# Patient Record
Sex: Female | Born: 1965
Health system: Southern US, Community
[De-identification: ages and names within clinical notes are randomized; demographics above are authoritative.]

## PROBLEM LIST (undated history)

## (undated) ENCOUNTER — Emergency Department (HOSPITAL_COMMUNITY): Discharge status: Against Medical Advice | Payer: BC Managed Care – PPO

## (undated) DIAGNOSIS — R112 Nausea with vomiting, unspecified: Secondary | ICD-10-CM

## (undated) DIAGNOSIS — R7303 Prediabetes: Secondary | ICD-10-CM

## (undated) DIAGNOSIS — M7710 Lateral epicondylitis, unspecified elbow: Secondary | ICD-10-CM

## (undated) DIAGNOSIS — K760 Fatty (change of) liver, not elsewhere classified: Secondary | ICD-10-CM

## (undated) DIAGNOSIS — M549 Dorsalgia, unspecified: Secondary | ICD-10-CM

## (undated) DIAGNOSIS — N816 Rectocele: Secondary | ICD-10-CM

## (undated) DIAGNOSIS — D649 Anemia, unspecified: Secondary | ICD-10-CM

## (undated) DIAGNOSIS — M2550 Pain in unspecified joint: Secondary | ICD-10-CM

## (undated) DIAGNOSIS — E66813 Obesity, class 3: Secondary | ICD-10-CM

## (undated) DIAGNOSIS — Z8639 Personal history of other endocrine, nutritional and metabolic disease: Secondary | ICD-10-CM

## (undated) DIAGNOSIS — Z9989 Dependence on other enabling machines and devices: Principal | ICD-10-CM

## (undated) DIAGNOSIS — R55 Syncope and collapse: Secondary | ICD-10-CM

## (undated) DIAGNOSIS — IMO0002 Reserved for concepts with insufficient information to code with codable children: Secondary | ICD-10-CM

## (undated) DIAGNOSIS — G4733 Obstructive sleep apnea (adult) (pediatric): Secondary | ICD-10-CM

## (undated) DIAGNOSIS — K589 Irritable bowel syndrome without diarrhea: Secondary | ICD-10-CM

## (undated) DIAGNOSIS — I10 Essential (primary) hypertension: Secondary | ICD-10-CM

## (undated) DIAGNOSIS — N3281 Overactive bladder: Secondary | ICD-10-CM

## (undated) DIAGNOSIS — F329 Major depressive disorder, single episode, unspecified: Secondary | ICD-10-CM

## (undated) DIAGNOSIS — M5137 Other intervertebral disc degeneration, lumbosacral region: Secondary | ICD-10-CM

## (undated) DIAGNOSIS — M51379 Other intervertebral disc degeneration, lumbosacral region without mention of lumbar back pain or lower extremity pain: Secondary | ICD-10-CM

## (undated) DIAGNOSIS — R0600 Dyspnea, unspecified: Secondary | ICD-10-CM

## (undated) DIAGNOSIS — F419 Anxiety disorder, unspecified: Secondary | ICD-10-CM

## (undated) DIAGNOSIS — M5412 Radiculopathy, cervical region: Principal | ICD-10-CM

## (undated) DIAGNOSIS — R600 Localized edema: Secondary | ICD-10-CM

## (undated) DIAGNOSIS — E282 Polycystic ovarian syndrome: Secondary | ICD-10-CM

## (undated) DIAGNOSIS — K219 Gastro-esophageal reflux disease without esophagitis: Secondary | ICD-10-CM

## (undated) DIAGNOSIS — K829 Disease of gallbladder, unspecified: Secondary | ICD-10-CM

## (undated) DIAGNOSIS — Z9889 Other specified postprocedural states: Secondary | ICD-10-CM

## (undated) DIAGNOSIS — H9192 Unspecified hearing loss, left ear: Secondary | ICD-10-CM

## (undated) DIAGNOSIS — N938 Other specified abnormal uterine and vaginal bleeding: Secondary | ICD-10-CM

## (undated) DIAGNOSIS — F32A Depression, unspecified: Secondary | ICD-10-CM

## (undated) HISTORY — DX: Dependence on other enabling machines and devices: Z99.89

## (undated) HISTORY — DX: Overactive bladder: N32.81

## (undated) HISTORY — DX: Reserved for concepts with insufficient information to code with codable children: IMO0002

## (undated) HISTORY — DX: Lateral epicondylitis, unspecified elbow: M77.10

## (undated) HISTORY — DX: Gastro-esophageal reflux disease without esophagitis: K21.9

## (undated) HISTORY — DX: Polycystic ovarian syndrome: E28.2

## (undated) HISTORY — DX: Fatty (change of) liver, not elsewhere classified: K76.0

## (undated) HISTORY — DX: Pain in unspecified joint: M25.50

## (undated) HISTORY — DX: Irritable bowel syndrome, unspecified: K58.9

## (undated) HISTORY — PX: ABDOMINAL HYSTERECTOMY: SHX81

## (undated) HISTORY — DX: Other specified abnormal uterine and vaginal bleeding: N93.8

## (undated) HISTORY — DX: Morbid (severe) obesity due to excess calories: E66.01

## (undated) HISTORY — DX: Anemia, unspecified: D64.9

## (undated) HISTORY — DX: Dorsalgia, unspecified: M54.9

## (undated) HISTORY — DX: Prediabetes: R73.03

## (undated) HISTORY — DX: Depression, unspecified: F32.A

## (undated) HISTORY — DX: Dyspnea, unspecified: R06.00

## (undated) HISTORY — PX: COLONOSCOPY: SHX174

## (undated) HISTORY — DX: Essential (primary) hypertension: I10

## (undated) HISTORY — DX: Anxiety disorder, unspecified: F41.9

## (undated) HISTORY — DX: Localized edema: R60.0

## (undated) HISTORY — DX: Major depressive disorder, single episode, unspecified: F32.9

## (undated) HISTORY — DX: Obstructive sleep apnea (adult) (pediatric): G47.33

## (undated) HISTORY — DX: Other intervertebral disc degeneration, lumbosacral region: M51.37

## (undated) HISTORY — DX: Radiculopathy, cervical region: M54.12

## (undated) HISTORY — DX: Rectocele: N81.6

## (undated) HISTORY — DX: Disease of gallbladder, unspecified: K82.9

## (undated) HISTORY — DX: Obesity, class 3: E66.813

## (undated) HISTORY — DX: Other intervertebral disc degeneration, lumbosacral region without mention of lumbar back pain or lower extremity pain: M51.379

---

## 1991-02-14 HISTORY — PX: CHOLECYSTECTOMY: SHX55

## 1999-03-07 ENCOUNTER — Ambulatory Visit (HOSPITAL_COMMUNITY): Admission: RE | Admit: 1999-03-07 | Discharge: 1999-03-07 | Payer: Self-pay | Admitting: *Deleted

## 1999-03-07 ENCOUNTER — Encounter: Payer: Self-pay | Admitting: *Deleted

## 2000-07-05 ENCOUNTER — Other Ambulatory Visit: Admission: RE | Admit: 2000-07-05 | Discharge: 2000-07-05 | Payer: Self-pay | Admitting: Obstetrics and Gynecology

## 2001-05-01 ENCOUNTER — Emergency Department (HOSPITAL_COMMUNITY): Admission: EM | Admit: 2001-05-01 | Discharge: 2001-05-02 | Payer: Self-pay

## 2002-09-08 ENCOUNTER — Encounter: Admission: RE | Admit: 2002-09-08 | Discharge: 2002-12-07 | Payer: Self-pay | Admitting: Endocrinology

## 2008-01-14 ENCOUNTER — Ambulatory Visit (HOSPITAL_COMMUNITY): Admission: RE | Admit: 2008-01-14 | Discharge: 2008-01-14 | Payer: Self-pay | Admitting: Obstetrics and Gynecology

## 2010-02-18 ENCOUNTER — Emergency Department (HOSPITAL_COMMUNITY)
Admission: EM | Admit: 2010-02-18 | Discharge: 2010-02-19 | Payer: Self-pay | Source: Home / Self Care | Admitting: Emergency Medicine

## 2010-02-28 LAB — BASIC METABOLIC PANEL
BUN: 8 mg/dL (ref 6–23)
CO2: 27 mEq/L (ref 19–32)
Calcium: 8.4 mg/dL (ref 8.4–10.5)
Chloride: 105 mEq/L (ref 96–112)
Creatinine, Ser: 0.85 mg/dL (ref 0.4–1.2)
GFR calc Af Amer: >60 mL/min (ref >60)
GFR calc non Af Amer: >60 mL/min (ref >60)
Glucose, Bld: 116 mg/dL — ABNORMAL HIGH (ref 70–99)
Potassium: 3.8 mEq/L (ref 3.5–5.1)
Sodium: 138 mEq/L (ref 135–145)

## 2010-02-28 LAB — CROSSMATCH
ABO/RH(D): A POS
Antibody Screen: NEGATIVE
Unit division: 0
Unit division: 0
Unit division: 0

## 2010-02-28 LAB — CBC
HCT: 20.7 % — ABNORMAL LOW (ref 36.0–46.0)
Hemoglobin: 5.2 g/dL — CL (ref 12.0–15.0)
MCH: 16 pg — ABNORMAL LOW (ref 26.0–34.0)
MCHC: 25.1 g/dL — ABNORMAL LOW (ref 30.0–36.0)
MCV: 63.5 fL — ABNORMAL LOW (ref 78.0–100.0)
Platelets: 435 10*3/uL — ABNORMAL HIGH (ref 150–400)
RBC: 3.26 MIL/uL — ABNORMAL LOW (ref 3.87–5.11)
RDW: 22.1 % — ABNORMAL HIGH (ref 11.5–15.5)
WBC: 8.5 10*3/uL (ref 4.0–10.5)

## 2010-02-28 LAB — ABO/RH: ABO/RH(D): A POS

## 2010-03-10 NOTE — Consult Note (Addendum)
[name], [name] NO.:  [id number]  MEDICAL RECORD NO.:  [id number]          PATIENT TYPE:  EMS  LOCATION:  [facility]                         FACILITY:  [facility]  PHYSICIAN:  Rock Nephew, MD       DATE OF BIRTH:  [birth year]  DATE OF CONSULTATION:  02/18/2010 DATE OF DISCHARGE:                                CONSULTATION   PRIMARY CARE PHYSICIAN:  Dr. Laurann Montana.  PRIMARY GYNECOLOGY DOCTOR:  Dr. Cordelia Pen A. Dickstein.  CHIEF COMPLAINT:  Symptomatic anemia, history of menorrhagia.  HISTORY OF PRESENT ILLNESS:  This 45 year old female comes in with a chief complaint of weakness.  The patient reports that she has been weak for the last month or so.  The patient reports that she has heavy periods.  She has periods that last 7 days.  She has a history of polycystic ovarian syndrome.  She reports on the first day of her period she changes a pad almost every hour.  They are very heavy and last for 7 days.  Her last menstrual period started on January 25, 2010, and ended approximately February 01, 2010.  The patient reports that her periods have been regular for the last four or five months.  The patient also reports that she gets shortness of breath on exertion.  Otherwise she denies any chest pain, any headaches, any abdominal pain, constipation, or diarrhea.  She denies any dark stool or any blood in her stools.  She does report some swelling in her legs.  PAST MEDICAL HISTORY:  History of polycystic ovarian syndrome and history of insulin resistance.  She denies having diabetes.  FAMILY HISTORY:  Father has heart disease.  SURGICAL HISTORY:  She has had a cholecystectomy.  SOCIAL HISTORY:  Nonsmoker, nondrinker.  No drug abuse.  ALLERGIES:  No known drug allergies.  HOME MEDICATIONS:  She takes metformin 500 mg day as needed.  She reports she takes it when she feels woozy.  REVIEW OF SYSTEMS:  Again, no headaches, no blurry vision, and no chest pain.   She has shortness of breath with exertion.  She denies any nausea, vomiting, abdominal pain, and constipation.  No dark stools.  No blood in her stools.  Again, heavy menorrhagia and heavy periods.  She denies any pain in her legs.  She reports she has some swelling in her legs.  PHYSICAL EXAMINATION:  VITAL SIGNS:  Temperature 98.2, blood pressure 141/63, pulse rate 92, respiratory rate 16, 100% saturation on room air. HEAD, EYES, EARS, NOSE, AND THROAT:  Normocephalic, atraumatic.  Pupils equally round and reactive to light. CARDIOVASCULAR:  S1 and S2.  Regular rate and rhythm.  No murmurs.  No rubs. LUNGS:  Clear to auscultation bilaterally.  No wheezes.  No rhonchi. ABDOMEN:  Soft, nontender, nondistended.  Bowel sounds positive.  No guarding or rebound tenderness.  EXTREMITIES:  +1 edema bilaterally. NEUROLOGICAL:  The patient is alert, awake, and oriented x3.  RADIOLOGY:  None.  LABORATORY STUDIES:  WBC count is 8.5, hemoglobin is 5.2, hematocrit 20.7.  Last H and H was 14.8/43.4.  This was in March  2003.  The patient's MCV is 63.5.  Platelets are 435.  Sodium 138, potassium 3.8, chloride 105, bicarbonate 27, BUN 8, creatinine 0.85, glucose 116, calcium is 8.4.  IMPRESSION AND PLAN:  This is a 45 year old female admitted for anemia with mild symptoms.  PROBLEMS: 1. Weakness and iron-deficiency anemia.  Again, the reason for this is     heavy periods and menorrhagia.  The patient should have blood     transfusions of at least 3 units of packed red blood cells.  I     recommended the patient be admitted to [facility].  Further     management of menorrhagia by GYN doctors. 2. Anemia, iron-deficiency anemia.  Again, this is related to     menorrhagia.  The patient should be placed on iron 325 mg p.o.     b.i.d. for at least 30 days and further managed by the patient's     PCP and GYN doctor. 3. History of polycystic ovarian syndrome.  Again, this is being     managed by  GYN. 4. Insulin resistance.  The patient has a history of insulin     resistance.  The patient was advised to have weight loss since the     patient is morbidly obese. 5. DVT prophylaxis.  The patient should receive SCDs for DVT     prophylaxis.  The patient is a full code.     Rock Nephew, MD     NH/MEDQ  D:  02/18/2010  T:  02/18/2010  Job:  [id number]  cc:   Stacie Acres. Cliffton Asters, M.D. Sherry A. Rosalio Macadamia, M.D.  Electronically Signed by Rock Nephew MD on 03/10/2010 10:17:29 AM

## 2010-03-16 ENCOUNTER — Other Ambulatory Visit: Payer: Self-pay | Admitting: Family Medicine

## 2010-03-16 ENCOUNTER — Ambulatory Visit: Admit: 2010-03-16 | Payer: Self-pay | Admitting: Obstetrics & Gynecology

## 2010-03-16 DIAGNOSIS — N938 Other specified abnormal uterine and vaginal bleeding: Secondary | ICD-10-CM

## 2010-03-16 DIAGNOSIS — N949 Unspecified condition associated with female genital organs and menstrual cycle: Secondary | ICD-10-CM

## 2010-03-16 LAB — POCT PREGNANCY, URINE: Preg Test, Ur: NEGATIVE

## 2010-03-21 ENCOUNTER — Ambulatory Visit (HOSPITAL_COMMUNITY)
Admission: RE | Admit: 2010-03-21 | Discharge: 2010-03-21 | Disposition: A | Discharge status: Home / Self Care | Payer: Self-pay | Source: Ambulatory Visit | Attending: Family Medicine | Admitting: Family Medicine

## 2010-03-21 DIAGNOSIS — N852 Hypertrophy of uterus: Secondary | ICD-10-CM | POA: Insufficient documentation

## 2010-03-21 DIAGNOSIS — N949 Unspecified condition associated with female genital organs and menstrual cycle: Secondary | ICD-10-CM | POA: Insufficient documentation

## 2010-03-21 DIAGNOSIS — N938 Other specified abnormal uterine and vaginal bleeding: Secondary | ICD-10-CM

## 2010-03-23 ENCOUNTER — Other Ambulatory Visit: Payer: Self-pay | Admitting: Family Medicine

## 2010-03-23 ENCOUNTER — Encounter: Payer: Self-pay | Admitting: Obstetrics & Gynecology

## 2010-03-23 ENCOUNTER — Ambulatory Visit: Payer: Self-pay | Admitting: Family Medicine

## 2010-03-23 DIAGNOSIS — N858 Other specified noninflammatory disorders of uterus: Secondary | ICD-10-CM

## 2010-03-23 DIAGNOSIS — N938 Other specified abnormal uterine and vaginal bleeding: Secondary | ICD-10-CM

## 2010-03-23 DIAGNOSIS — N949 Unspecified condition associated with female genital organs and menstrual cycle: Secondary | ICD-10-CM

## 2010-03-23 DIAGNOSIS — I10 Essential (primary) hypertension: Secondary | ICD-10-CM

## 2010-03-23 DIAGNOSIS — R1900 Intra-abdominal and pelvic swelling, mass and lump, unspecified site: Secondary | ICD-10-CM

## 2010-03-23 LAB — CONVERTED CEMR LAB
BUN: 11 mg/dL (ref 6–23)
CO2: 25 meq/L (ref 19–32)
Calcium: 8.3 mg/dL — ABNORMAL LOW (ref 8.4–10.5)
Chloride: 107 meq/L (ref 96–112)
Creatinine, Ser: 0.77 mg/dL (ref 0.40–1.20)
Glucose, Bld: 112 mg/dL — ABNORMAL HIGH (ref 70–99)
HCT: 36.5 % (ref 36.0–46.0)
Hemoglobin: 10.3 g/dL — ABNORMAL LOW (ref 12.0–15.0)
MCHC: 28.2 g/dL — ABNORMAL LOW (ref 30.0–36.0)
MCV: 83.1 fL (ref 78.0–100.0)
Platelets: 393 10*3/uL (ref 150–400)
Potassium: 4.5 meq/L (ref 3.5–5.3)
RBC: 4.39 M/uL (ref 3.87–5.11)
RDW: 27.6 % — ABNORMAL HIGH (ref 11.5–15.5)
Sodium: 141 meq/L (ref 135–145)
WBC: 4.8 10*3/uL (ref 4.0–10.5)

## 2010-03-25 ENCOUNTER — Ambulatory Visit (HOSPITAL_COMMUNITY)
Admission: RE | Admit: 2010-03-25 | Discharge: 2010-03-25 | Disposition: A | Discharge status: Home / Self Care | Payer: Self-pay | Source: Ambulatory Visit | Attending: Obstetrics & Gynecology | Admitting: Obstetrics & Gynecology

## 2010-03-25 DIAGNOSIS — M47817 Spondylosis without myelopathy or radiculopathy, lumbosacral region: Secondary | ICD-10-CM | POA: Insufficient documentation

## 2010-03-25 DIAGNOSIS — N9489 Other specified conditions associated with female genital organs and menstrual cycle: Secondary | ICD-10-CM | POA: Insufficient documentation

## 2010-03-25 DIAGNOSIS — N858 Other specified noninflammatory disorders of uterus: Secondary | ICD-10-CM

## 2010-03-25 MED ORDER — IOHEXOL 300 MG/ML  SOLN
100.0000 mL | Freq: Once | INTRAMUSCULAR | Status: AC | PRN
  Administered 2010-03-25: 100 mL via INTRAVENOUS

## 2010-03-30 ENCOUNTER — Ambulatory Visit: Payer: Self-pay | Admitting: Family Medicine

## 2010-03-30 ENCOUNTER — Encounter: Payer: Self-pay | Admitting: Family Medicine

## 2010-03-30 DIAGNOSIS — IMO0002 Reserved for concepts with insufficient information to code with codable children: Secondary | ICD-10-CM | POA: Insufficient documentation

## 2010-03-30 DIAGNOSIS — D649 Anemia, unspecified: Secondary | ICD-10-CM | POA: Insufficient documentation

## 2010-03-30 DIAGNOSIS — I10 Essential (primary) hypertension: Secondary | ICD-10-CM | POA: Insufficient documentation

## 2010-03-30 DIAGNOSIS — N938 Other specified abnormal uterine and vaginal bleeding: Secondary | ICD-10-CM | POA: Insufficient documentation

## 2010-03-30 DIAGNOSIS — N816 Rectocele: Secondary | ICD-10-CM | POA: Insufficient documentation

## 2010-03-30 DIAGNOSIS — R1900 Intra-abdominal and pelvic swelling, mass and lump, unspecified site: Secondary | ICD-10-CM

## 2010-03-31 ENCOUNTER — Ambulatory Visit (INDEPENDENT_AMBULATORY_CARE_PROVIDER_SITE_OTHER): Payer: Self-pay | Admitting: Family Medicine

## 2010-03-31 ENCOUNTER — Encounter: Payer: Self-pay | Admitting: Family Medicine

## 2010-03-31 VITALS — BP 133/82 | HR 77 | Temp 98.8°F | Ht 68.11 in | Wt 355.0 lb

## 2010-03-31 DIAGNOSIS — I10 Essential (primary) hypertension: Secondary | ICD-10-CM

## 2010-03-31 DIAGNOSIS — D649 Anemia, unspecified: Secondary | ICD-10-CM

## 2010-03-31 DIAGNOSIS — E282 Polycystic ovarian syndrome: Secondary | ICD-10-CM | POA: Insufficient documentation

## 2010-03-31 DIAGNOSIS — N938 Other specified abnormal uterine and vaginal bleeding: Secondary | ICD-10-CM

## 2010-03-31 DIAGNOSIS — N949 Unspecified condition associated with female genital organs and menstrual cycle: Secondary | ICD-10-CM

## 2010-03-31 DIAGNOSIS — Z0000 Encounter for general adult medical examination without abnormal findings: Secondary | ICD-10-CM

## 2010-03-31 MED ORDER — HYDROCHLOROTHIAZIDE 25 MG PO TABS: 50.0000 mg | ORAL_TABLET | Freq: Every day | ORAL | Status: DC

## 2010-03-31 NOTE — Progress Notes (Signed)
  Subjective:    Patient ID: [name], female    DOB: February 17, 1965, 45 y.o.   MRN: [id number] SUBJECTIVE:  45 y.o. female for new pt visit.  Pt's only problems are newly diagnosed HTN and morbid obesity.  Pt also has PCOS and dysfunctional uterine bleeding which is being followed by GYN clinic at [facility].    Current Outpatient Prescriptions  Medication Sig Dispense Refill  . hydrochlorothiazide 25 MG tablet Take 2 tablets (50 mg total) by mouth daily.  60 tablet  11   Allergies: Review of patient's allergies indicates no known allergies.  Patient's last menstrual period was 03/21/2010.  ROS:  Feeling well. No dyspnea or chest pain on exertion.  No abdominal pain, change in bowel habits, black or bloody stools.  No urinary tract symptoms. GYN ROS: no breast pain or new or enlarging lumps on self exam, no discharge or pelvic pain. No neurological complaints.     Hypertension This is a new problem. The current episode started 1 to 4 weeks ago. The problem is unchanged. The problem is controlled. Associated symptoms include peripheral edema. Pertinent negatives include no anxiety, blurred vision, chest pain, headaches, malaise/fatigue, palpitations or shortness of breath. There are no associated agents to hypertension. Risk factors for coronary artery disease include family history, obesity and smoking/tobacco exposure. Past treatments include diuretics. The current treatment provides moderate improvement. Compliance problems include medication cost.  There is no history of angina, kidney disease, CAD/MI or CVA.      Review of Systems  Constitutional: Negative for malaise/fatigue.  Eyes: Negative for blurred vision.  Respiratory: Negative for shortness of breath.   Cardiovascular: Negative for chest pain and palpitations.  Neurological: Negative for headaches.       Objective:   Physical Exam    OBJECTIVE:  The patient appears well, alert, oriented x 3, in no distress. BP 133/82   Pulse 77  Temp(Src) 98.8 F (37.1 C) (Oral)  Ht 5' 8.11" (1.73 m)  Wt 355 lb (161.027 kg)  BMI 53.80 kg/m2  LMP 03/21/2010 ENT normal.  Neck supple. No adenopathy or thyromegaly. PERLA. Lungs are clear, good air entry, no wheezes, rhonchi or rales. S1 and S2 normal, no murmurs, regular rate and rhythm. Abdomen soft without tenderness, guarding, mass or organomegaly. Extremities show no edema, normal peripheral pulses. Neurological is normal, no focal findings.  BREAST EXAM: breasts appear normal, no suspicious masses, no skin or nipple changes or axillary nodes    Assessment & Plan:  ASSESSMENT:  well woman  PLAN:  mammogram additional lab tests per orders return annually or prn

## 2010-03-31 NOTE — Patient Instructions (Signed)
It was so nice to meet you! Please get the number for [name] at the front so that you can see if you qualify for an orange card. Once you have talked with [name], please come back to see me so that we can check your cholesterol and see how your blood pressure is doing. Let me know when the GYN clinic schedules you for surgery! Feel free to come back any time you need something!

## 2010-03-31 NOTE — Assessment & Plan Note (Signed)
New pt today, being referred by [facility] GYN clinic for HTN. Overall, no serious health problems outside of obesity and dysfunctional uterine bleeding. Currently uninsured, will d/w [facility].  Needs baseline FLP, BMET, CBC (esp with h/o anemia 2/2 uterine bleeding)

## 2010-03-31 NOTE — Assessment & Plan Note (Signed)
BP improved on recheck. Had been started on HCTZ 25mg  po daily 2 weeks ago at [facility].  Has been taking ~1 week.  No side effects or hypotensive symptoms. Tolerating well.  Increase HCTZ to 50mg  qd. Told pt to decrease back to 25mg  if experiencing any s/s of hypotension, including orthostatic hypotension.  Will check BMET once pt has met w/ [name].

## 2010-03-31 NOTE — Assessment & Plan Note (Signed)
According to pt, GYN would like to start her on lupron and then have her see GYN surgery for large fibroids. Has regular periods that last 6 days, but 2nd day is very heavy bleeding (passes clots, needs to change pad every 15-30 min), on other days changes pad every 2-3 hours)

## 2010-03-31 NOTE — Assessment & Plan Note (Signed)
Pt aware she needs to lose weight.  We discussed setting small, reasonable goals.  She is in agreement but not ready to make any set plans at this time.  Also made her aware that we have a wonderful nutritionist if she feels like that would help her with diet.

## 2010-04-06 ENCOUNTER — Ambulatory Visit: Payer: Self-pay

## 2010-04-06 DIAGNOSIS — N938 Other specified abnormal uterine and vaginal bleeding: Secondary | ICD-10-CM

## 2010-04-06 DIAGNOSIS — N949 Unspecified condition associated with female genital organs and menstrual cycle: Secondary | ICD-10-CM

## 2010-05-06 NOTE — Progress Notes (Signed)
[name], [name]                  ACCOUNT NO.:  [id number]  MEDICAL RECORD NO.:  [id number]           PATIENT TYPE:  A  LOCATION:  [facility]                   FACILITY:  [facility]  PHYSICIAN:  Lucina Mellow, DO   DATE OF BIRTH:  04-10-1965  DATE OF SERVICE:  03/30/2010                                 CLINIC NOTE  The patient presents for a result of her CT scan and blood work and states that she has an appointment with [facility] tomorrow.  HISTORY OF PRESENT ILLNESS:  The patient is a 45 year old Caucasian female who has been seen in the clinic for concern of dysfunctional uterine bleeding.  She was noted to have a pelvic mass on ultrasound at her last visit and was __________ away and got a CT scan to further evaluate this.  She is here today for that result.  She also had some blood work done last week and she is here for those results to determine how anemic she is.  She has a history of having had 3 units of blood transfused on February 18, 2010.  She states that she feels fine.  Last week, we also started her on hydrochlorothiazide 25 mg daily in an attempt to control her high blood pressure.  The blood pressure has not changed, but the patient states that she feels better and has actually lost 9 pounds since last week.  As earlier stated she has an appointment tomorrow with family practice to further have her blood pressure evaluated.  Her results were shared with her and are as follows, the CT scan shows that the uterus is prominent size measuring 15 cm x 9.8 cm x 10.4 cm.  The ovaries are within normal limits by CT scan.  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  It also states that the possibility of a rare lipoleiomyosarcoma is difficult to completely exclude.  Her blood work showed that her hemoglobin is 10.3, hematocrit is 36.5, MCH is 23.5, MCHC is 28.2, and RDW is 27.6.  Her glucose was elevated at 112.  Her basic metabolic panel was otherwise  normal with normal renal function.  PHYSICAL EXAMINATION:  VITAL SIGNS:  Today show a blood pressure 154/100, weight of 353.0 pounds, height is 59 inches, pulse of 96, temperature of 98.8. GENERAL:  The patient is a pleasant-looking female who looks her stated age of 20.  ASSESSMENT: 1. Uterine mass noted to be a leiomyoma versus a lipoleiomyoma.  She     is made aware of the CT scan results and is told that she would     benefit from a Depo-Lupron shot.  Her paperwork was filled out last     week, but whenever we called to find out if they had received the     request, they said they had not received the fax with her income     information, so all of the information, the request and income     information was faxed again today with hopes that we will get a     response back within a week.  The patient is  informed that she will     receive a letter at approximately the same time the clinic received     the letter of her approval or disapproval.  When she receives the     letter hopefully stating that she is approved, she is to call the     clinic and setup when she is to come and get her shot of Depo-     Lupron.  After her shot of Depo-Lupron within the next 3 months,     she is to follow up to see one of our GYN surgeons to discuss     surgical options at that time. 2. High blood pressure.  She will see family practice tomorrow.  She     will continue the 25 mg of hydrochlorothiazide and family medicine     can decide to change that or add to it. 3. Dysfunctional uterine bleeding.  If she has a period in the     meantime that is heavier than usual, she is to contact the clinic     and be assessed.  She can also take her Lysteda if necessary to     help control the bleeding and the     Lupron again is suggested as a way to help control the bleeding.     The patient voices understanding and agrees with these plans.          ______________________________ Lucina Mellow,  DO    SH/MEDQ  D:  03/30/2010  T:  03/31/2010  Job:  [id number]

## 2010-05-06 NOTE — Progress Notes (Signed)
[name], [name] NO.:  [id number]  MEDICAL RECORD NO.:  [id number]           PATIENT TYPE:  A  LOCATION:  [facility]                   FACILITY:  [facility]  PHYSICIAN:  Lucina Mellow, DO   DATE OF BIRTH:  15-Jul-1965  DATE OF SERVICE:  03/23/2010                                 CLINIC NOTE  The patient was seen in the [facility] on the afternoon March 23, 2010.  The patient presents for followup on her ultrasound results that she had last week.  HISTORY OF PRESENT ILLNESS:  The patient is a 45 year old, gravida 1, para 1 who was sent to [facility] after being seen in the emergency room on February 18, 2010, for weakness and fatigue.  At which time, she was noted to be quite anemic and was transferred 3 units of blood.  She states that previously when she was seen at [facility], they gave her a prescription for a medication called Lysteda 650 mg. She states that she was instructed to take that if the bleeding became heavy.  This morning, she states that the bleeding became heavy and she may she had quite a bit of running around to do including admitting this appointment today that she took one this morning and one earlier in the afternoon.  She states that has the system with some of the bleeding it made her little bit less.  She states that she has some mild discomfort on her left lower quadrant that has been there for quite some time and has not changed over the course of the last 2 weeks.  She states that she has also not been seen yet by [facility].  She has an appointment on March 31, 2010, for evaluation of her blood pressure. She has no new concerns at this time.  To review, her past surgical history includes a gallbladder removal.  Gynecologic history includes no history of abnormal Pap test or sexually transmitted diseases.  Obstetrically, she has a history of one pregnancy, one vaginal delivery at term 18  years ago.  On physical exam; the patient is a pleasant female looking her stated age of 45 years old.  Vital signs include temp of 99.3, pulse of 77, blood pressure 152/87, on repeat was 140/90, weight of 362.5 pounds, height of 69 inches.  The patient's laboratory values were shared with her today.  The Pap test was negative and satisfactory for evaluation with an endocervical transformation zone present and it was negative for lesions or malignancy.  Her ultrasound showed a uterus measuring 17 x 7 x 18.5 x 10.9 cm.  A large hyperechoic mass is seen centrally within the uterine body and fundus which measures approximately 10 cm.  It is uncertain whether this arises from the myometrium or endometrium, posterior acoustic artifact limits visualization in the posterior portion of this mass and posterior known Macaulay.  No normal-appearing endometrial stripe is visualized by transabdominal and transvaginal ultrasound.  It is recommended by the ultrasound report for the patient to receive a pelvic MRI to further visualize this mass.  There is also nonvisualization of the  ovaries, but there are no adnexal masses or free fluid identified.  ASSESSMENT: 1. Uterine masses versus large leiomyoma.  The patient is shared these     results including the Pap test results.  We discussed her options     at this point and the need to get an MRI.  However, whenever     attempt was made to schedule this MRI, it was found that the     patient is too large for the MRI machine at [facility] and would     have to re-refer to an outside facility.  However, that costs quite     a bit of money and the patient is unable to pay that at this time.     The decision was made for her to go forth with getting a CT scan of     her abdomen and see what we could see that way.  A CBC and a basic     metabolic panel were drawn in the clinic today, so that we can     assess her kidney function and order for the contrast that  she will     need for her MRI/CT evaluation as well as to evaluate were as her     hemoglobin at this time more than 1 month after her 3 units of     transfusion during in the emergency room.  The patient will return     to the clinic after her CT scan imaging at which time, we will     discuss those results and further plans at that time. 2. Hypertension.  The patient has an appointment at 1-week to be seen     by [facility] if her blood pressure remains to be elevated.  I     started her on 25 mg of hydrochlorothiazide a day knowing that this     is a very inexpensive medication and she should be able to easily     afford it over-the-counter.  In addition, she should follow up in a     week with her doctor at [facility] to see if this is an     adequate dose of medication.  It is discussed with the patient that     it would be difficult to feel safe taking her to the operating room     and control blood pressure and is in paramount importance for [place] to     control that because ultimately she will likely have to have a     hysterectomy to have this mass removed. 3. Because of the bleeding and because of the size of the mass, she     will likely need to have Lupron injections to help shrink the mass     and control for the bleeding.  However, again this is quite costly     and upon discharge from my clinic today, the patient also completed     a paper work to receive financial assistance from the Lupron.     Hopefully, we will know the results of this request when she     returns in about a week after her CT scan and we can discuss     getting her, her Lupron shot at that time.  All the patient's     questions were answered and she agrees with plan.          ______________________________ Lucina Mellow, DO    SH/MEDQ  D:  03/23/2010  T:  03/24/2010  Job:  [id number]

## 2010-05-06 NOTE — Progress Notes (Signed)
[name], [name]                  ACCOUNT NO.:  [id number]  MEDICAL RECORD NO.:  [id number]           PATIENT TYPE:  A  LOCATION:  [facility]                   FACILITY:  [facility]  PHYSICIAN:  Lucina Mellow, DO   DATE OF BIRTH:  15-May-1965  DATE OF SERVICE:  03/16/2010                                 CLINIC NOTE  PRESENTING COMPLAINT:  Heavy menstrual bleeding.  She was referred to [place] from [facility].  The patient was seen in the emergency room at [facility] on February 18, 2010, who presented with a chief complaint of weakness.  The patient was seen and evaluated and was noted to have a hemoglobin of 5.  Her weakness started whenever she started having heavy period that day.  The patient gives a history that she feels like the hemoglobin had been slowly dropping over the course of the last couple of months if not last 2 years, because she admits to having symptoms of __________ she has been crunching on ice constantly. She states that her periods have been irregular and heavy for quite some time and even admits to having an endometrial biopsy in 2006 at [facility] where it was diagnosed as normal and no treatment plan was ensued.  The patient states that since she was seen at that time in 2006 at [facility], she lost her medical insurance which is why she was referred to our clinic for treatment and care.  She is in the process of applying for financial assistance through the [facility].  She states that her heaviest is about the second day of her period and her last menstrual period started January 24, 2010, where she had quite a bit of clotting which was worse than previous periods she states.  She has had some sort of spotting off and on ever since the bleeding stopped in mid January 2012 but no bleeding has started.  When she was seen on February 18, 2010, she was evaluated and managed in the ER she states because of her lack of insurance they gave her 3 units transfusion  there in the emergency room and discharged her home.  She has no pain but does admit to some mild cramping off and on, and she also states that she has some stress and urge incontinence that she has had for quite some time number of years and will lose control of her bladder when she coughs, sneezes, or laughs.  She denies ever having an ultrasound since the onset of the current symptoms.  PAST SURGICAL HISTORY:  Gallbladder removal.  GYNECOLOGIC HISTORY:  History includes no history of abnormal Pap test or sexually transmitted diseases.  OBSTETRICAL HISTORY:  One pregnancy, one vaginal delivery at term.  Her baby is now 7 years old.  FAMILY HISTORY:  Significant for colon cancer in uncle but otherwise no other history of endometrial, ovarian, uterine, or breast cancers.  MEDICATIONS:  Currently include iron and Motrin.  PHYSICAL EXAMINATION:  VITAL SIGNS:  Temperature is 100.2, her pulse is 68, blood pressure is 150/93, weight is 359 pounds, and 69 inches high. GENERAL:  She is a pleasant Caucasian  female. HEART:  Regular rate and rhythm with no audible murmurs. LUNGS:  Clear to auscultation bilaterally. NECK:  Thyroid is nonpalpable. PELVIC:  Externally, her female genitalia are normal in appearance. Internal vaginal exam with speculum is somewhat difficult secondary to the cystocele and rectocele noted.  It is difficult to isolate the cervix to get a Pap test.  However it is isolated and the Pap test is obtained.  The pressure against the cervix for the Pap test pushes the cervix back out of view in such a way that I am unsure if I got in the cervical cells with a Pap testing.  A Pap speculum exam shows that she has probably a grade 3 to 4 cystocele and rectocele are quite significant as well and was tender to touch.  Bimanual exam was somewhat difficult due to body habitus, however, it did feel that her uterus was dropped and firm in the anterior vaginal vault, was not  tender to touch at that time.  Her vaginal vault was tender posteriorly where the rectocele was seen.  ASSESSMENT: 1. Dysfunctional uterine bleeding.  Secondary to the difficulty of     obtaining just the Pap smear probably it would have been difficult     to obtain an endometrial biopsy in the clinic today at this time.     However, it was suggested by attending doctor, Dr. Okey Dupre, that the     patient have an ultrasound first to determine what is the     endometrial stripe at this time which would help and indicate     whether or not endometrial biopsy would give [place] a good yield.  So     the patient was scheduled for an ultrasound after she was seen by     the provider in clinic today which was scheduled for March 21, 2010, at 12:30.  She will then follow up in the GYN Clinic again     for this results and treatment plans at that time. 2. Cystocele and rectocele.  This likely should be managed with     surgery, weight loss, but at this time, we will manage     symptomatically. 3. The patient was also noted to have high blood pressure today in     clinic, and she will be referred to [facility].     She will be assessed for that to determine if she needs to be on     medication for her blood pressure.  The patient's questions were     answered, and she voiced understanding and agreed with plans.          ______________________________ Lucina Mellow, DO    SH/MEDQ  D:  03/16/2010  T:  03/17/2010  Job:  [id number]

## 2010-05-23 ENCOUNTER — Ambulatory Visit: Payer: Self-pay | Admitting: Obstetrics & Gynecology

## 2010-05-23 ENCOUNTER — Other Ambulatory Visit: Payer: Self-pay | Admitting: Obstetrics & Gynecology

## 2010-05-23 ENCOUNTER — Other Ambulatory Visit (HOSPITAL_COMMUNITY)
Admission: RE | Admit: 2010-05-23 | Discharge: 2010-05-23 | Disposition: A | Discharge status: Home / Self Care | Payer: Self-pay | Source: Ambulatory Visit | Attending: Obstetrics & Gynecology | Admitting: Obstetrics & Gynecology

## 2010-05-23 DIAGNOSIS — N938 Other specified abnormal uterine and vaginal bleeding: Secondary | ICD-10-CM | POA: Insufficient documentation

## 2010-05-23 DIAGNOSIS — N949 Unspecified condition associated with female genital organs and menstrual cycle: Secondary | ICD-10-CM | POA: Insufficient documentation

## 2010-05-23 DIAGNOSIS — D259 Leiomyoma of uterus, unspecified: Secondary | ICD-10-CM

## 2010-05-23 LAB — POCT PREGNANCY, URINE: Preg Test, Ur: NEGATIVE

## 2010-05-24 NOTE — Progress Notes (Signed)
[name], HACK NO.:  [id number]  MEDICAL RECORD NO.:  [id number]           PATIENT TYPE:  [facility]ATION:  [facility]                     FACILITY:  PHYSICIAN:  Scheryl Darter, MD       DATE OF BIRTH:  1965-04-14  DATE OF SERVICE:                                 CLINIC NOTE  The patient comes today in followup for surgical consult.  She is a 45- year-old white female, gravida 1, para 1, last menstrual period April 22, 2010, which is very heavy.  She had been hospitalized in January with heavy bleeding, anemia, and she received 3 units of packed red blood cells.  The patient was found to have a uterine mass that was fat containing, measuring 8.3 x 8.1 x 6.4 cm.  The endometrium was not well delineated or visualized.  Possibility was that this was a lipoleiomyoma with possibility of rare lipoleiomyosarcoma, which could not be completely excluded.  The patient does have some bleeding that lasted since the date of her last period until recently.  No complaints of pain.  PHYSICAL EXAMINATION:  VITAL SIGNS:  Weight is 360 pounds, height 69 inches, blood pressure is 137/96, pulse 78, temperature 98.5. GENERAL:  The patient has normal affect.  No acute distress. ABDOMEN:  Obese and nontender. PELVIC:  External genitalia, vagina, and cervix appeared normal.  Cervix was high and posterior and small.  There was scant amount of blood. Uterus is about 12-14 weeks size and nontender.  Discussed having an endometrial biopsy prior to making any decisions about surgery.  Explained the procedure and the risk of pain, bleeding, infection, or uterine damage.  She signed consent for this.  She had an endometrial biopsy few years at [facility].  Cervix was prepped with Betadine and Milex.  Sampler was passed 10 cm and some bloody tissue was obtained.  She tolerated this well.  All instruments were removed.  She will be notified of the result when she returns next week on April  18.     Scheryl Darter, MD    JA/MEDQ  D:  05/23/2010  T:  05/24/2010  Job:  [id number]

## 2010-06-01 ENCOUNTER — Ambulatory Visit: Payer: Self-pay | Admitting: Obstetrics & Gynecology

## 2010-06-01 DIAGNOSIS — D259 Leiomyoma of uterus, unspecified: Secondary | ICD-10-CM

## 2010-06-01 NOTE — H&P (Signed)
NAMESARAI, [name] NO.:  [id number]  MEDICAL RECORD NO.:  [id number]           PATIENT TYPE:  A  [facility]ATION[facility]                   [facility]  [facility]  PHYSICIAN:  Scheryl Darter, MD       DATE OF BIRTH:  1965/09/03  DATE OF SERVICE:                          PRE-OP HISTORY & PHYSICAL  CHIEF COMPLAINT:  Heavy bleeding, anemia, and uterine mass.  The patient is a 45 year old white female gravida 1, para 1, last menstrual period April 22, 2010 who has a history of heavy menstrual periods, anemia, and transfusion.  In [name] she was admitted with anemia.  She received 3 units of packed red blood cells on her admission.  She is to be followed by [facility] for her blood pressure.  She has large fibroid uterus, possible lipoleiomyoma with a rare lipo leiomyosarcoma not completely excluded.  Endometrial biopsy was done recently.  She does have some sense of a pelvic mass and some urinary urgency.  PAST MEDICAL HISTORY: 1. Morbid obesity. 2. Insulin resistance. 3. Hypertension. 4. Polycystic ovary syndrome. 5. Gallbladder disease.  PAST SURGICAL HISTORY:  Cholecystectomy.  PAST OB HISTORY:  Term vaginal delivery 18 years ago.  FAMILY HISTORY:  Colon cancer in uncle.  No history of ovarian, endometrial, uterine, or breast cancer.  MEDICATIONS: 1. Iron supplement 325 mg p.o. b.i.d. 2. Hydrochlorothiazide 25 mg p.o. daily. 3. Ibuprofen p.o. p.r.n. pain.  SOCIAL HISTORY:  The patient is married.  She is a former smoker.  She denies alcohol or drug use.  Her husband has had a vasectomy.  ALLERGIES:  No known drug allergies.  No latex allergies.  REVIEW OF SYSTEMS:  No bleeding currently except for very slight amount which occurred a few days ago.  No abnormal discharge.  Urinary frequency and urgency as noted.  No shortness of breath, chest pain.  PHYSICAL EXAMINATION:  GENERAL:  The patient in no acute distress.  She has pleasant affect. VITAL  SIGNS:  Weight is 359.6 pounds, height 69 inches, blood pressure 150/94, pulse 66, temperature 98.5. CHEST:  Clear. HEART:  Regular rhythm.  No thyromegaly or lymphadenopathy. ABDOMEN:  Obese, soft, nontender with firm mass below the umbilicus consistent with her fibroid uterus. PELVIC:  Performed on April 9 showed normal external genitalia, vagina, and cervix.  Cervix was high and posterior.  Uterus is 12-14-week size and nontender.  Endometrium sounded to 10 cm.  LABORATORY DATA:  Endometrial biopsy on April 9 showed proliferative type endometrium.  Pap test on March 16, 2010 was negative.  IMPRESSION: 1. Large fibroid uterus with possible lipoleiomyoma.  Leiomyosarcoma     is not excluded but doubt based on her other clinical findings and     result of her endometrial biopsy. 2. Hypertension. 3. Obesity. 4. History of insulin resistance. 5. history of anemia.  PLAN:  The patient will be scheduled for total abdominal hysterectomy, bilateral salpingo-oophorectomy.  We discussed possibility of preservation of her ovaries and she was given this option but after answering her questions, she said she would feel more comfortable with removal of her ovaries.  She understands she might be on estrogen  therapy after that.  The risks of anesthesia, bleeding, infection, bowel or urinary tract damage, and pain were discussed.  I also mentioned the risks of there was a cancer of the uterus but that has not so far as does not point to that being the case.  We will discuss the results of pathology after the procedure.  Questions were answered.  She is scheduled for another Lupron Depo on May 10 and if surgery has not been performed by then, I would suggest that she receive the injection.     Scheryl Darter, MD    JA/MEDQ  D:  06/01/2010  T:  06/01/2010  Job:  [id number]

## 2010-06-23 ENCOUNTER — Ambulatory Visit (INDEPENDENT_AMBULATORY_CARE_PROVIDER_SITE_OTHER): Payer: Self-pay | Admitting: Family Medicine

## 2010-06-23 ENCOUNTER — Encounter: Payer: Self-pay | Admitting: Family Medicine

## 2010-06-23 ENCOUNTER — Ambulatory Visit: Payer: Self-pay

## 2010-06-23 VITALS — BP 132/82 | HR 81 | Temp 98.3°F | Wt 365.3 lb

## 2010-06-23 DIAGNOSIS — L989 Disorder of the skin and subcutaneous tissue, unspecified: Secondary | ICD-10-CM

## 2010-06-23 DIAGNOSIS — N949 Unspecified condition associated with female genital organs and menstrual cycle: Secondary | ICD-10-CM

## 2010-06-23 DIAGNOSIS — I10 Essential (primary) hypertension: Secondary | ICD-10-CM

## 2010-06-23 DIAGNOSIS — D259 Leiomyoma of uterus, unspecified: Secondary | ICD-10-CM

## 2010-06-23 DIAGNOSIS — N938 Other specified abnormal uterine and vaginal bleeding: Secondary | ICD-10-CM

## 2010-06-23 NOTE — Progress Notes (Signed)
Pt is here today for follow up.  She was to get orange card after last office visit, but received a letter in the mail saying that she had 100% coverage and got confused (this was saying she would have that if she applied), so she is going next week to get this.  Pt has surgery by GYN on 07/08/10 (likely total hysterectomy +/- ovaries) for her PCOS and dysfunctional uterine bleeding.  Pt states that the bleeding has improved some what. She is debating on whether or not to have her ovaries removed also-- she is leaning toward just getting everything removed and is interested in discussing hormone replacement options if this is what she has done.  Is also wondering if she should start taking metformin again (she was taking this before for PCOS, no diagnosis of DM); we discussed that depending on what procedure she ends up having done will tailor the answers to these questions.  Encouraged her to ask her GYN for input also.  As for pt's HTN, is tolerating increased dose of HCTZ (25 --> 50mg ). No orthostatic symptoms, no CP, lower ext edema has greatly improved, no HA's or visual changes. No SOB. Will need BMET checked to f/u K and Cr, but has pre-op appt 5/15, so will hold on labs until 1. Pt has orange card and 2. To see which labs are done next week so that they are not duplicated unnecessarily.   Pt's only other concern is a "spot" on her shoulder which her husband noticed.  It has been there for approximately a year; no pain or swelling, has not changed. No h/o skin cancer or prolonged sun exposure or bad sunburns. No spots anywhere else on body that she or her husband have noticed.   PE: afebrile, VSS GEN: NAD, in good spirits, accompanied by daughter CV: RRR, no murmur, 2+ pedal pulses Pulm: CTAB, no wheezes or crackles Ext: WWP, no edema Skin: small, non-raised, erythematous, blanching 1x1cm area, appears to be c/w scar tissue, well healed, no drainage or fluctuant areas  A/P: 45 yo here for f/u with  surgery scheduled for 07/08/10. Pt's BP is improved and from a CV standpoint pt should be cleared for surgery. Pre-op is 5/15; told pt to have them call me with any questions or concerns. -Is getting orange card next week before surgery.  Will need BMET, FLP, and CBC (check hgb b/c of h/o dysfunctional uterine bleeding) at some point; will f/u what is done as pre-op w/u. -Will follow area on shoulder-- most c/w healing scar, does not appear to be concerning for malignancy; is not a raised mole, no dark areas, consistent color, unchanged x59yr.  -BP acceptable at 132/82 on HCTZ 50 qd; will hold on adjusting or adding meds as this time as BPs are normal and I have not been able to check renal function.

## 2010-06-23 NOTE — Assessment & Plan Note (Signed)
Stable, small 1x1 area on right shoulder; pet pt has been there and unchanged x1 year. No pain, no drainage, flat, no hyperpigmentation.  Appears to be benign healing scar.  Will continue to follow/monitor for any changes that would make it concerning for a skin cancer.

## 2010-06-23 NOTE — Assessment & Plan Note (Signed)
BP normotensive. Continue HCTZ 50mg . No s/s of hypotensio, LEE improved. Will need to check BMET (either f/u pre-op or order post-op)

## 2010-06-23 NOTE — Assessment & Plan Note (Signed)
Scheduled for total hysterectomy +/- ovaries on 07/08/10.  Issues that may need to be discussed s/p surgery depending on if pt decides to have ovaries removed, inc hormone replacement.  Will also need CBC checked to f/u hgb after bleeding has been resolved.

## 2010-06-27 ENCOUNTER — Ambulatory Visit: Payer: Self-pay

## 2010-06-27 DIAGNOSIS — N938 Other specified abnormal uterine and vaginal bleeding: Secondary | ICD-10-CM

## 2010-06-27 DIAGNOSIS — N949 Unspecified condition associated with female genital organs and menstrual cycle: Secondary | ICD-10-CM

## 2010-06-28 ENCOUNTER — Other Ambulatory Visit: Payer: Self-pay | Admitting: Obstetrics & Gynecology

## 2010-06-28 ENCOUNTER — Encounter (HOSPITAL_COMMUNITY): Payer: Self-pay | Attending: Obstetrics & Gynecology

## 2010-06-28 DIAGNOSIS — Z01818 Encounter for other preprocedural examination: Secondary | ICD-10-CM | POA: Insufficient documentation

## 2010-06-28 LAB — BASIC METABOLIC PANEL
BUN: 15 mg/dL (ref 6–23)
CO2: 29 mEq/L (ref 19–32)
Calcium: 9 mg/dL (ref 8.4–10.5)
Chloride: 99 mEq/L (ref 96–112)
Creatinine, Ser: 0.73 mg/dL (ref 0.4–1.2)
GFR calc Af Amer: >60 mL/min (ref >60)
GFR calc non Af Amer: >60 mL/min (ref >60)
Glucose, Bld: 106 mg/dL — ABNORMAL HIGH (ref 70–99)
Potassium: 3.3 mEq/L — ABNORMAL LOW (ref 3.5–5.1)
Sodium: 140 mEq/L (ref 135–145)

## 2010-06-28 LAB — CBC
HCT: 45.9 % (ref 36.0–46.0)
Hemoglobin: 14.6 g/dL (ref 12.0–15.0)
MCH: 28.9 pg (ref 26.0–34.0)
MCHC: 31.8 g/dL (ref 30.0–36.0)
MCV: 90.9 fL (ref 78.0–100.0)
Platelets: 234 10*3/uL (ref 150–400)
RBC: 5.05 MIL/uL (ref 3.87–5.11)
RDW: 13.5 % (ref 11.5–15.5)
WBC: 7.7 10*3/uL (ref 4.0–10.5)

## 2010-06-28 LAB — SURGICAL PCR SCREEN
MRSA, PCR: NEGATIVE
Staphylococcus aureus: NEGATIVE

## 2010-07-06 ENCOUNTER — Ambulatory Visit (INDEPENDENT_AMBULATORY_CARE_PROVIDER_SITE_OTHER): Payer: Self-pay

## 2010-07-06 DIAGNOSIS — D259 Leiomyoma of uterus, unspecified: Secondary | ICD-10-CM

## 2010-07-07 NOTE — Assessment & Plan Note (Signed)
NAMEKATRINNA, TRAVIESO NO.:  [id number]  MEDICAL RECORD NO.:  [id number]           PATIENT TYPE:  LOCATION:  [facility]           FACILITY:  PHYSICIAN:  Tinnie Gens, MD        DATE OF BIRTH:  [birth year]  DATE OF SERVICE:  07/06/2010                                 CLINIC NOTE  CHIEF COMPLAINT:  Surgery consult.  HISTORY OF PRESENT ILLNESS:  The patient is a 45 year old gravida 1, para 1 who is scheduled for abdominal hysterectomy for a large fibroid uterus including a possible lipoleiomyoma with rare lipoleiomyosarcoma not being excluded.  She has undergone endometrial sampling in the past, was scheduled Dr. Scheryl Darter.  Because of family emergency, this is being rescheduled leading with her today just to discussed her surgery. I have reviewed her past medical history, surgical history, OB, family history, medications.  We did discuss the surgery at length including complication risk of bleeding, infection, injury to surrounding structures.  We need to fix this if they are found at the time of surgery or delayed presentation of these things.  DVT risk and death with the anesthesia.  The patient understands all this.  On exam today, she does have a large mass that fills the lower abdomen, feels like there is fibroids up to just below the umbilicus.  We did not complete a pelvic today, she has had this done previously.  She did get two shots of Depo-Lupron, which she continues to have spotting all the time.  She desires abdominal hysterectomy with bilateral salpingo-oophorectomy, which we will do.  Additionally, we discussed risk of cancer and need for further surgery if we find that.  The patient understands all of this and will follow up with [place] as needed.          ______________________________ Tinnie Gens, MD    TP/MEDQ  D:  07/06/2010  T:  07/06/2010  Job:  [id number]

## 2010-07-08 ENCOUNTER — Inpatient Hospital Stay (HOSPITAL_COMMUNITY)
Admission: RE | Admit: 2010-07-08 | Discharge: 2010-07-10 | DRG: 743 | Disposition: A | Discharge status: Home / Self Care | Payer: Self-pay | Source: Ambulatory Visit | Attending: Obstetrics & Gynecology | Admitting: Obstetrics & Gynecology

## 2010-07-08 ENCOUNTER — Other Ambulatory Visit: Payer: Self-pay | Admitting: Family Medicine

## 2010-07-08 DIAGNOSIS — D259 Leiomyoma of uterus, unspecified: Secondary | ICD-10-CM

## 2010-07-08 DIAGNOSIS — N920 Excessive and frequent menstruation with regular cycle: Principal | ICD-10-CM | POA: Diagnosis present

## 2010-07-08 DIAGNOSIS — E669 Obesity, unspecified: Secondary | ICD-10-CM | POA: Diagnosis present

## 2010-07-08 DIAGNOSIS — Z01818 Encounter for other preprocedural examination: Secondary | ICD-10-CM

## 2010-07-08 DIAGNOSIS — Z01812 Encounter for preprocedural laboratory examination: Secondary | ICD-10-CM

## 2010-07-08 DIAGNOSIS — D649 Anemia, unspecified: Secondary | ICD-10-CM

## 2010-07-08 LAB — POTASSIUM: Potassium: 2.8 mEq/L — ABNORMAL LOW (ref 3.5–5.1)

## 2010-07-08 LAB — PREGNANCY, URINE: Preg Test, Ur: NEGATIVE

## 2010-07-09 LAB — CBC
HCT: 38.4 % (ref 36.0–46.0)
Hemoglobin: 12 g/dL (ref 12.0–15.0)
MCH: 28.9 pg (ref 26.0–34.0)
MCHC: 31.3 g/dL (ref 30.0–36.0)
MCV: 92.5 fL (ref 78.0–100.0)
Platelets: 243 10*3/uL (ref 150–400)
RBC: 4.15 MIL/uL (ref 3.87–5.11)
RDW: 14.5 % (ref 11.5–15.5)
WBC: 10.7 10*3/uL — ABNORMAL HIGH (ref 4.0–10.5)

## 2010-07-09 LAB — BASIC METABOLIC PANEL
BUN: 14 mg/dL (ref 6–23)
CO2: 32 mEq/L (ref 19–32)
Calcium: 8.3 mg/dL — ABNORMAL LOW (ref 8.4–10.5)
Chloride: 99 mEq/L (ref 96–112)
Creatinine, Ser: 0.76 mg/dL (ref 0.4–1.2)
GFR calc Af Amer: >60 mL/min (ref >60)
GFR calc non Af Amer: >60 mL/min (ref >60)
Glucose, Bld: 109 mg/dL — ABNORMAL HIGH (ref 70–99)
Potassium: 3 mEq/L — ABNORMAL LOW (ref 3.5–5.1)
Sodium: 139 mEq/L (ref 135–145)

## 2010-07-12 ENCOUNTER — Inpatient Hospital Stay (HOSPITAL_COMMUNITY)
Admission: AD | Admit: 2010-07-12 | Discharge: 2010-07-12 | Disposition: A | Discharge status: Home / Self Care | Payer: Self-pay | Source: Ambulatory Visit | Attending: Family Medicine | Admitting: Family Medicine

## 2010-07-12 DIAGNOSIS — IMO0002 Reserved for concepts with insufficient information to code with codable children: Secondary | ICD-10-CM

## 2010-07-12 NOTE — Op Note (Signed)
[name], [name]                  ACCOUNT NO.:  [id number]  MEDICAL RECORD NO.:  [id number]           PATIENT TYPE:  I  LOCATION:  9318                          [facility]  PHYSICIAN:  Kayven Aldaco S. Shawnie Pons, M.D.   DATE OF BIRTH:  February 03, 1966  DATE OF PROCEDURE:  07/08/2010 DATE OF DISCHARGE:                              OPERATIVE REPORT   PREOPERATIVE DIAGNOSES:  Fibroid uterus, menorrhagia, anemia, obesity,  POSTOPERATIVE DIAGNOSES:  Fibroid uterus, menorrhagia, anemia, obesity.  PROCEDURE:  Total abdominal hysterectomy and bilateral salpingo- oophorectomy.  SURGEON:  Shelbie Proctor. Shawnie Pons, MD  ASSISTANT:  Lesly Dukes, MD  ANESTHESIA:  General and local, Dr. Arby Barrette.  FINDINGS:  Enlarged uterus, normal tubes and ovaries.  SPECIMENS:  Uterus, cervix, tubes, and ovaries to Pathology.  BLOOD LOSS:  400 mL.  COMPLICATIONS:  None immediately known.  REASON FOR PROCEDURE:  Briefly, the patient is a 45 year old gravida 1, para 1 who had a history of menorrhagia and fibroid uterus.  She was admitted with a hemoglobin of 5, required 3 units of packed red blood cells.  Since that time, she was given Depo-Lupron x2, and she continued to have some spotting, however, hemoglobin was 14 at the time of surgery.  The patient had ultrasound that showed an enlarged fibroid uterus and possibly a lipoleiomyoma, could not rule out lipoleiomyosarcoma.  For these reasons, the patient was counseled about risks and benefits of this procedure, obtained frozen section if necessary at time of surgery, possibly removing the omentum as she consented to BSO.  Hysterectomy for definitive treatment.  PROCEDURE:  The patient was taken to the OR.  She was placed in supine position.  She was prepped and draped in the usual sterile fashion.  She was noted to have intertriginous yeast especially along her mons and in the thigh area.  This area was prepped with Betadine while ChloraPrep was used on the  abdomen.  A Foley catheter was placed inside the bladder.  A time-out was performed.  The patient had received Ancef prior to coming to the OR.  SCDs were in placed.  A marking pen was used to draw a vertical line from the umbilicus down to the pubic bone.  A knife was used to make an incision here.  Horizontal lines with a marking pen were made tacking the dermis.  The electrocautery was then used to dissect down through only 8-10 cm of subcutaneous tissue until the fascia was found.  This was divided in the midline with the electrocautery and then a Janee Morn was used to further extend the incision both inferiorly and superiorly.  The pyramidalis was taken down on one side off the fascia edge.  Otherwise, we were noted to be in the midline.  The peritoneal cavity was entered bluntly.  The uterus was globular and enlarged approximately 16 weeks size.  The Balfour retractor was placed.  The bladder blade was placed as well.  A Kelly clamp was clamped across the tubo-ovarian round ligaments on the patient's left.  The left round ligament was brought up with a Babcock clamp and suture ligated.  Electrocautery was used to take down the round ligament.  There was a good amount of fat in the round ligament. The anterior peritoneal cavity was then opened with electrocautery on top of the suction creating one-half of the bladder flap.  The tubo- ovarian pedicle was then clamped with a Heaney clamp, cut and tied with free tie followed by suture ligature.  Attention was then turned to the right, with the right round was elevated, suture ligated, and cut with the electrocautery.  The anterior leaf of the broad ligament was taken down with the electrocautery over suction curette from the entirety of the bladder blade.  The bladder was pushed down.  The tubo-ovarian pedicle was taken on this side Heaney clamped, cut and free tie plus suture ligature.  Attention then turned to the uterine artery.  It  was taken on the right side first with clamp for back bleeding.  It was then taken on the left.  There was some bleeding on the left side that was easily stopped with the secondary and tertiary bites down the cervix. Another bite was taken on the patient's right and once fairly certainly had secured the uterine arteries, the bulk of the uterus was locked off and sent to frozen section.  The cervix was then grasped with Kocher clamps and then straight Kochers were used along the edge of the cervix to continue down until the end of the cervix with the uterosacral and cardinal ligaments were clamped, cut, and suture ligated.  Then once we were below the cervix, two curved Heaney were used, and Heaney-type sutures were done at the corners, and the Jorgenson scissors were used to take the cervix off the vagina.  At this point, a long swaged-on suture was used to close the cuff in a running locked fashion.  There is excellent hemostasis noted.  Attention was then returned to the tubes and ovaries.  Initially, the patient's left tube and ovary were brought up.  There seemed to be may be some adhesions on the posterior peritoneum onto the IP, so this was taken down with sharp dissection. Staying very close the tube and ovary, this was clamped with a Heaney clamp, cut, and free tie plus suture ligature with excellent hemostasis. The patient's right tube and ovary were also brought up with a Babcock clamp.  Similar adhesion of posterior peritoneum was taken down. Staying close to the tube and ovary, the a Heaney clamp was placed followed by removal of the tube and ovary then a free tie suture ligature was placed here that showed excellent hemostasis.  The abdomen was irrigated.  There was minimal amount of bleeding on the left pelvic sidewall.  It was minimal to very superficial electrocautery.  It was felt hemostasis was adequate.  Attention was then turned to closure of the fascial defect.  The  fascia was grasped with a Kocher clamp superiorly, and a 0 PDS on the LEEP suture was used to get the apex. Two Kelly clamps were used to hold the peritoneum, and a bulk closure was done for the top one-half of this incision.  There was bowel poking out and the malleable was used to hold it in.  The bottom one-half of the inferior margin of the fascia was also grasped with a Kocher clamp and tied with another looped PDS suture.  Fascia was closed only from the inferior edge up to about halfway down the incision and then these two sutures were tied together.  Any bleeders in  the subcu were then cauterized.  A #10 JP drain was then used, was laid in the subcutaneous area to drain.  It came through on the left lower quadrant of the patient's abdomen.  The suture was tied in with silk suture.  The 0 plain suture, four interrupted were used to close the subcutaneous defect.  The skin was closed using clips.  30 mL of 0.25% Marcaine were injected about the incision just prior to the staple closure.  All instrument and lap counts correct x2.  The patient was awakened and taken to recovery room in stable condition.     Shelbie Proctor. Shawnie Pons, M.D.     TSP/MEDQ  D:  07/08/2010  T:  07/09/2010  Job:  [id number]  Electronically Signed by Tinnie Gens M.D. on 07/12/2010 10:03:01 AM

## 2010-07-15 DIAGNOSIS — Z09 Encounter for follow-up examination after completed treatment for conditions other than malignant neoplasm: Secondary | ICD-10-CM

## 2010-07-16 NOTE — Group Therapy Note (Signed)
[name], [name] NO.:  [id number]  MEDICAL RECORD NO.:  [id number]           PATIENT TYPE:  A  LOCATION:  [facility]                   FACILITY:  [facility]  PHYSICIAN:  Allie Bossier, MD        DATE OF BIRTH:  Jun 02, 1965  DATE OF SERVICE:  07/15/2010                                 CLINIC NOTE  Ms. [name] is a 45 year old married white morbidly obese lady who underwent a vertical laparotomy and TAHBSO for dysfunctional uterine bleeding and fibroids.  She was discharged home on postop day #2 doing very well.  She did go home with a JP drain, and her staples in place. She comes back today with no complaints, only for staple removal and JP removal.  She is having approximately 20 mL of fluid out of her JP on a daily basis.  She reports normal bowel and bladder function, would like to know when she can start exercising.  Blood pressure 140/89, weight 366 pounds, pulse 67, temperature 99.  Her abdominal exam is benign. Her staples are intact without erythema.  Her JP area is without erythema.  I cut the silk sutures and removed the JP easily.  Steri- Strips placed across back.  The nurse removed the staples and placed full-length Steri-Strips across the vertical incision.  She will come back in 4 weeks for her regular postop visit or sooner if necessary.     Allie Bossier, MD    MCD/MEDQ  D:  07/15/2010  T:  07/16/2010  Job:  [id number]

## 2010-07-22 ENCOUNTER — Inpatient Hospital Stay (HOSPITAL_COMMUNITY)
Admission: AD | Admit: 2010-07-22 | Discharge: 2010-07-22 | Disposition: A | Discharge status: Home / Self Care | Payer: Self-pay | Source: Ambulatory Visit | Attending: Obstetrics & Gynecology | Admitting: Obstetrics & Gynecology

## 2010-07-22 DIAGNOSIS — N390 Urinary tract infection, site not specified: Secondary | ICD-10-CM

## 2010-07-22 DIAGNOSIS — R109 Unspecified abdominal pain: Secondary | ICD-10-CM | POA: Insufficient documentation

## 2010-07-22 LAB — URINALYSIS, ROUTINE W REFLEX MICROSCOPIC
Bilirubin Urine: NEGATIVE
Glucose, UA: NEGATIVE mg/dL
Ketones, ur: 15 mg/dL — AB
Nitrite: NEGATIVE
Protein, ur: 30 mg/dL — AB
Specific Gravity, Urine: 1.015 (ref 1.005–1.030)
Urobilinogen, UA: 1 mg/dL (ref 0.0–1.0)
pH: 6.5 (ref 5.0–8.0)

## 2010-07-22 LAB — URINE MICROSCOPIC-ADD ON

## 2010-07-23 LAB — URINE CULTURE
Colony Count: 25000
Culture  Setup Time: 201206090116

## 2010-08-03 ENCOUNTER — Ambulatory Visit: Payer: Self-pay | Admitting: Obstetrics & Gynecology

## 2010-08-03 DIAGNOSIS — T8131XA Disruption of external operation (surgical) wound, not elsewhere classified, initial encounter: Secondary | ICD-10-CM

## 2010-08-04 NOTE — Group Therapy Note (Unsigned)
[name], [name] NO.:  [id number]  MEDICAL RECORD NO.:  [id number]           PATIENT TYPE:  A  LOCATION:  [facility]                   FACILITY:  [facility]  PHYSICIAN:  Jaynie Collins, MD     DATE OF BIRTH:  [birth year]  DATE OF SERVICE:  08/03/2010                                 CLINIC NOTE  [name] is a 46 year old morbidly obese female who underwent vertical laparotomy and TAH-BSO for dysfunctional uterine bleeding and fibroids. The patient has had multiple visits for her incisional check in the clinic and in [facility] for superficial wound dehiscence.  Right now looks to be healing okay.  She does have an area in the middle part of the incision with a little bit of scar tissue that is not fully approximated.  She does not have any erythema, drainage, or any other concerns.  The area that is superficially separated was reapproximated with Steri-Strips.  She was told that her incision does not look like it has any signs of infection.  She does have an appointment with Dr. Shawnie Pons for 6-week postoperative visit and she was told to keep this appointment and to call or come back in for any further concerns.          ______________________________ Jaynie Collins, MD    UA/MEDQ  D:  08/03/2010  T:  08/04/2010  Job:  [id number]

## 2010-08-13 DIAGNOSIS — R1900 Intra-abdominal and pelvic swelling, mass and lump, unspecified site: Secondary | ICD-10-CM

## 2010-08-24 ENCOUNTER — Ambulatory Visit (INDEPENDENT_AMBULATORY_CARE_PROVIDER_SITE_OTHER): Payer: Self-pay | Admitting: Obstetrics & Gynecology

## 2010-08-24 ENCOUNTER — Encounter: Payer: Self-pay | Admitting: Obstetrics & Gynecology

## 2010-08-24 VITALS — BP 148/94 | HR 65 | Temp 99.2°F | Ht 68.0 in | Wt 365.3 lb

## 2010-08-24 DIAGNOSIS — Z789 Other specified health status: Secondary | ICD-10-CM

## 2010-08-24 DIAGNOSIS — N3941 Urge incontinence: Secondary | ICD-10-CM

## 2010-08-24 DIAGNOSIS — Z87898 Personal history of other specified conditions: Secondary | ICD-10-CM

## 2010-08-24 MED ORDER — OXYBUTYNIN CHLORIDE 5 MG PO TABS: 5.0000 mg | ORAL_TABLET | Freq: Two times a day (BID) | ORAL | Status: DC

## 2010-08-24 NOTE — Progress Notes (Signed)
  Subjective:     [name] is a 45 y.o. female who presents to the clinic 6 weeks status post total abdominal hysterectomy and BSO for fibroids. Eating a regular diet without difficulty. Bowel movements are normal. The patient is not having any pain. She does complain that her pre operative urge incontinence is still an issue she would like addressed.  The following portions of the patient's history were reviewed and updated as appropriate: current medications, past family history, past medical history, past social history, past surgical history and problem list.  Review of Systems A comprehensive review of systems was negative.    Objective:    BP 148/94  Pulse 65  Temp(Src) 99.2 F (37.3 C) (Oral)  Ht 5\' 8"  (1.727 m)  Wt 365 lb 4.8 oz (165.699 kg)  BMI 55.54 kg/m2  LMP 03/21/2010 General:  alert  Abdomen: non-tender  Incision:   healing well, no drainage, no erythema, no hernia, no seroma, no swelling, no dehiscence, incision well approximated     Assessment:    Doing well postoperatively. Operative findings again reviewed. Pathology report discussed.    Plan:    1. Continue any current medications. 2. Wound care discussed. 3. Activity restrictions: none 4. Anticipated return to work: now. 5. Follow up: 2 months for follow up of her Urge Incontinece.

## 2010-08-26 ENCOUNTER — Ambulatory Visit (INDEPENDENT_AMBULATORY_CARE_PROVIDER_SITE_OTHER): Payer: Self-pay | Admitting: Family Medicine

## 2010-08-26 ENCOUNTER — Encounter: Payer: Self-pay | Admitting: Family Medicine

## 2010-08-26 DIAGNOSIS — N949 Unspecified condition associated with female genital organs and menstrual cycle: Secondary | ICD-10-CM

## 2010-08-26 DIAGNOSIS — Z0000 Encounter for general adult medical examination without abnormal findings: Secondary | ICD-10-CM

## 2010-08-26 DIAGNOSIS — G4730 Sleep apnea, unspecified: Secondary | ICD-10-CM

## 2010-08-26 DIAGNOSIS — N938 Other specified abnormal uterine and vaginal bleeding: Secondary | ICD-10-CM

## 2010-08-26 DIAGNOSIS — I10 Essential (primary) hypertension: Secondary | ICD-10-CM

## 2010-08-26 NOTE — Patient Instructions (Signed)
It was great to see you today!  Things that you said that you think you can work on to help make yourself heathier: 1. Changing what you drink: you have decided to have one sweat green tea and one mountain dew can per day; all other drinks will be crystal light in water (green tea or any other flavor) 2. Walking the 1 mile 5 days/week, even if it means having to walk early in the morning or in the evening  Please come back for your fasting labs; don't eat or drink anything before coming, unless it's water. We'll work on getting the sleep study set up. I'll make the referral to Dr. Gerilyn Pilgrim; she should call you with an appointment.  I'll put in the dental referral so that you can get in to see them.   I will send in your prescriptions to the [facility] on [address].  Once you fill those, please only take one of your HCTZ pills per day.

## 2010-08-27 ENCOUNTER — Encounter: Payer: Self-pay | Admitting: Family Medicine

## 2010-08-27 DIAGNOSIS — G4730 Sleep apnea, unspecified: Secondary | ICD-10-CM | POA: Insufficient documentation

## 2010-08-27 MED ORDER — FUROSEMIDE 20 MG PO TABS: 20.0000 mg | ORAL_TABLET | Freq: Every day | ORAL | Status: DC

## 2010-08-27 MED ORDER — LISINOPRIL 20 MG PO TABS: 20.0000 mg | ORAL_TABLET | Freq: Every day | ORAL | Status: DC

## 2010-08-27 MED ORDER — HYDROCHLOROTHIAZIDE 25 MG PO TABS: 25.0000 mg | ORAL_TABLET | Freq: Every day | ORAL | Status: DC

## 2010-08-27 NOTE — Assessment & Plan Note (Addendum)
Pt doing well overall.  No further need for Paps as pt s/p total hysterectomy.  Will have pt return for FLP and repeat BMET.  Last CBC WNL post-op at [facility]. Pt currently on lupron; if this ends up being long term, should consider starting pt on Ca since increased risk for osteopenia/osteoporosis. Pt also would like dental referral- no acute problem, but per pt, orange card requires referral to be seen by dentist. Desires skin tag removal at next visit

## 2010-08-27 NOTE — Assessment & Plan Note (Signed)
Has resolved s/p surgery.  Feels like she has much more energy. Being followed by GYN at [facility]. Next f/u in 6 weeks.

## 2010-08-27 NOTE — Assessment & Plan Note (Signed)
Pt concerned she may have OSA due to difficulties with ventilation during and after procedure.  Per pt, Dr. Marice Potter believes this is likely an issue for her.  Will have pt have sleep study to assess if she needs cPAP and if so, what settings and mask she needs.  This could also be contributing to HTN.

## 2010-08-27 NOTE — Assessment & Plan Note (Signed)
BP remains slightly elevated and LE edema has worsened.  Pt also having problems with low K+.  Will decrease HCTZ back to 25mg  (had been increased to 50) and will start lisinopril 20mg .  Will also start lasix 20 and titrate to effect slowly to avoid hypoTN. Will recheck BMET when pt gets FLP to follow K+ and Cr.

## 2010-08-27 NOTE — Assessment & Plan Note (Signed)
Pt would like to work on this.  Has identified 2 areas she will try to change: 1. Continue walking 1 mile 5x/week, even if it requires her to walk in the morning or late evening due to eat. 2. Decrease sodas and sweet green tea to 1 drink each per day and substitute water w/ crystal light in it for all other drinks that day. Pt also desires a referral to Dr. Gerilyn Pilgrim for further assistance.  Informed pt try to keep a food journal for 3 days prior to her appt with Dr. Gerilyn Pilgrim.

## 2010-08-27 NOTE — Progress Notes (Addendum)
S: Pt comes in today for CPE.  CPE Doing well overall. Increased energy s/p hysterectomy. Has been trying to walk 1 mi/day, but has been difficult with the heat.  Pt would like to try to lose weight.  Discussed average diet: pt recognizes she eats fast food frequently, but tries to eat things like [employer] salad; eats eggs, sausage; tries to eat brown bread but husband and daughter prefer white.  Drinks 2-3 16oz SWEET green tea drinks/day and 2 12oz regular mountain dews per day.  Also recognizes that it is difficult to afford "healthy" foods.  Very interested in d/w nutritionist for ideas and assistance.  Pt now with orange card and would like to see dentist for general dental health and to have fillings checked since she thinks that some have fallen out.  Needs referral.  Pt needs Tdap, unsure of when she last had.  HTN Has been taking her meds every day, never misses a dose.  Does not check BP at home.  No headaches, vision changes, chest pains, or dizziness.  Is having increase BLE swelling.  Still non-smoker.  Obesity See above.  Dysfunctional Uterine Bleeding Resolved s/p hysterectomy; followed by GYN.  No bleeding or complications. Has some "foggy headed" feelings with lupron but overall doing well.  ?Sleep Apnea Pt concerned about having sleep apnea 2/2 reported difficulty being ventilated during surgery and needed to go to ICU post-op for bi-pap. Would like to find out if she has this.   Past Medical History  Diagnosis Date  . Hypertension   . Cystocele   . Rectocele   . Dysfunctional uterine bleeding   . Anemia     Hgb 5 02/18/10, blood transfusion 02/18/10  . Depression   . PCOS (polycystic ovarian syndrome)   . Obesity, Class III, BMI 40-49.9 (morbid obesity)     History   Social History  . Marital Status: Married    Spouse Name: Deazia Lampi    Number of Children: 2  . Years of Education: highschool   Occupational History  . 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 Smoking status: Former Smoker    Types: Cigarettes    Quit date: 02/14/1995  . Smokeless tobacco: Never Used  . Alcohol Use: No  . Drug Use: No  . Sexually Active: Yes -- Female partner(s)    Birth Control/ Protection: Surgical   Other Topics Concern  . Not on file   Social History Narrative  . No narrative on file    O:  Filed Vitals:   08/26/10 1551  BP: 143/90  Pulse: 58  Temp: 98.7 F (37.1 C)    Gen: NAD CV: RRR, no murmur Pulm: CTAB Abd: soft, NT Ext: warm, no chronic skin change, 1+ BLE edema   A/P: 45 yo F w/ HTN, obesity, concern for OSA, s/p hysterectomy here for CPE -see problem list -f/u in 1 month for MD visit, next week for fasting labs

## 2010-08-29 ENCOUNTER — Other Ambulatory Visit: Payer: Self-pay

## 2010-08-29 ENCOUNTER — Ambulatory Visit (HOSPITAL_BASED_OUTPATIENT_CLINIC_OR_DEPARTMENT_OTHER): Payer: Self-pay | Attending: Family Medicine

## 2010-08-29 DIAGNOSIS — G4733 Obstructive sleep apnea (adult) (pediatric): Secondary | ICD-10-CM | POA: Insufficient documentation

## 2010-08-30 ENCOUNTER — Other Ambulatory Visit: Payer: Self-pay

## 2010-08-30 DIAGNOSIS — I10 Essential (primary) hypertension: Secondary | ICD-10-CM

## 2010-08-30 LAB — BASIC METABOLIC PANEL
BUN: 23 mg/dL (ref 6–23)
Calcium: 9.3 mg/dL (ref 8.4–10.5)
Chloride: 97 mEq/L (ref 96–112)
Creat: 0.93 mg/dL (ref 0.50–1.10)

## 2010-08-30 LAB — LIPID PANEL
Cholesterol: 189 mg/dL (ref 0–200)
HDL: 39 mg/dL — ABNORMAL LOW (ref >39)
Triglycerides: 133 mg/dL (ref <150)
VLDL: 27 mg/dL (ref 0–40)

## 2010-08-30 NOTE — Progress Notes (Signed)
Flp,cmp done today [facility] Zuriah Bordas

## 2010-09-01 ENCOUNTER — Encounter: Payer: Self-pay | Admitting: Family Medicine

## 2010-09-01 ENCOUNTER — Ambulatory Visit: Payer: Self-pay | Admitting: Family Medicine

## 2010-09-03 DIAGNOSIS — G4733 Obstructive sleep apnea (adult) (pediatric): Secondary | ICD-10-CM

## 2010-09-03 NOTE — Procedures (Signed)
NAME:  [name], [name]                  ACCOUNT NO.:  [id number]  MEDICAL RECORD NO.:  [id number]          PATIENT TYPE:  OUT  LOCATION:  S[facility] CENTER                 FACILITY:  [facility]  PHYSICIAN:  Tanairi Cypert D. Maple Hudson, MD, FCCP, FACPDATE OF BIRTH:  1965/05/14  DATE OF STUDY:  08/29/2010                           NOCTURNAL POLYSOMNOGRAM  REFERRING PHYSICIAN:  JACQUELYN MCGILL  INDICATION FOR STUDY:  Hypersomnia with sleep apnea.  EPWORTH SLEEPINESS SCORE:  12/24, BMI 55.6.  Weight 366 pounds, height 68 inches.  Neck 19 inches.  MEDICATIONS:  Home medications are charted and reviewed.  SLEEP ARCHITECTURE:  Total sleep time 171 minutes with sleep efficiency 47.5%.  Stage I was 15.2%, stage II 71.1%, stage III 1.2%, REM 12.6% of total sleep time.  Sleep latency 78 minutes, REM latency 167 minutes, awake after sleep onset 100.5 minutes, arousal index 34.7.  Bedtime medication:  None.  RESPIRATORY DATA:  Apnea/hypopnea index (AHI) 33 per hour.  A total of 94 events was scored including seven obstructive apneas, one central apnea 86 hypopneas.  Events were associated with non supine sleep position.  REM AHI 30.7.  Because of markedly delayed sleep onset.  She was not able to meet the time windows for application of CPAP titration by split protocol on the study night.  OXYGEN DATA:  Moderate snoring with oxygen desaturation to a nadir of 81% and a mean oxygen saturation through the study of 91.2% on room air.  CARDIAC DATA:  Normal sinus rhythm.  MOVEMENT-PARASOMNIA:  No significant movement disturbance.  Bathroom times one.  IMPRESSION-RECOMMENDATIONS: 1. Sleep architecture was significant for lack of sustained sleep     until 2:00 a.m.  Clinical discussion may determine if this is     habitual. 2. Severe obstructive sleep apnea/hypopnea syndrome, AHI 33 per hour     with nonsupine events, moderate snoring and oxygen desaturation to     a nadir of 81% with a mean saturation through the  study of 91.2% on     room air. 3. Consider return for dedicated CPAP titration study or evaluate for     alternative management as clinically appropriate.     Mahagony Grieb D. Maple Hudson, MD, [facility], FACP Diplomate, Biomedical engineer of Sleep Medicine Electronically Signed    CDY/MEDQ  D:  09/03/2010 12:48:21  T:  09/03/2010 21:16:08  Job:  [id number]

## 2010-09-05 ENCOUNTER — Ambulatory Visit: Payer: Self-pay | Admitting: Family Medicine

## 2010-09-08 ENCOUNTER — Ambulatory Visit (INDEPENDENT_AMBULATORY_CARE_PROVIDER_SITE_OTHER): Payer: Self-pay | Admitting: Family Medicine

## 2010-09-08 ENCOUNTER — Encounter: Payer: Self-pay | Admitting: Family Medicine

## 2010-09-08 ENCOUNTER — Telehealth: Payer: Self-pay | Admitting: Family Medicine

## 2010-09-08 DIAGNOSIS — I10 Essential (primary) hypertension: Secondary | ICD-10-CM

## 2010-09-08 NOTE — Telephone Encounter (Signed)
[place] to PCP for review

## 2010-09-08 NOTE — Progress Notes (Signed)
Medical Nutrition Therapy:  Appt start time: 1230 end time:  1330. \ Assessment:  Primary concerns today: Weight management and hypertension.  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  Usual sleep is about 6-9 hrs/night, and sleep quality is poor b/c of both surgical and chemical (Lupron) menopause, and sleep pattern is erratic.  She stopped all of her med's recently b/c of getting severe diarrhea (no fever) last Fri, and through the weekend.  She wonders if it is related to new Rx of lisinopril, but she plans to restart the HCTZ and Lasix.  Bowel function is back to normal as of today.  Usual eating pattern includes 2-3 meals and 2-3 snacks per day.  Everyday foods include takeout (5 X wk), sweet green tea, Mtn Dew, diet drinks.  Avoided foods include none.  24-hr recall is not totally normal b/c stomach : Up ~1 PM:  L (1 PM)- 1 c boiled potatoes w/ salt and sugar, 24 oz Mtn Dew; Snk ( PM)- ; D (8 PM)- small plate loaded nachos (cheese, light sour cream, hamburger, let, tom, Diet Dr. Reino Kent, 1 pc cheesecake; Snk (11 PM)- 1/2 c hamburger casserole; to bed till 5 AM (insomnia); up at 11 AM.  Usual physical activity includes walking 1/2 to 1 mi/day.  Ms. [name] said she started to gain wt when she went to college. She lost wt with [employer] when it was on the market, but has not been able to lose wt on her own.      Progress Towards Goal(s):  In progress.   Nutritional Diagnosis:  NB-2.1 Physical inactivity As related to fatigue and time constraints.  As evidenced by physical activity limited to 1/2-1 mi walking daily.  . NI-1.5 Excessive energy intake As related to expenditure.  As evidenced by elevated BMI.    Intervention:  Nutrition education.  Monitoring/Evaluation:  Dietary intake, exercise, and body weight in 1 month.

## 2010-09-08 NOTE — Telephone Encounter (Signed)
Wants to know results of sleep study.  Also, she has stopped the Lisinopril b/c it gave her diarrhea.  She stopped it yesterday and is not sure what to do now.

## 2010-09-08 NOTE — Patient Instructions (Addendum)
-   Consistency is crucial to your feeling well, making the best food choices, and to managing your wt.  - SLEEP (amount and quality) is also essential for wt management and well being.    CALL FOR RESULTS OF YOUR SLEEP STUDY.  - Talk to Dr. Fara Boros re. insomnia, and ask also about sleep hygiene.  - Eat at least 3 meals and 1-2 snacks per day.  Aim for no more than 5 hours between eating. - Obtain twice as many veg's as protein or carbohydrate foods for both lunch and dinner. - Meal planning:  You want meals that are balanced (see above), and are low in fat and sugar, and high in fiber.  Example:  Lunch of sandwich (40-calorie bread or bread thins or high-protein wraps), meat , low- or non-fat condiments, veg's (salad, raw veg's with low-fat dip, veg soup).  Dinner of [place], cooked frozen broccoli, corn.   - Homework assignment:  Design a list of at least 5 meals that are relatively easy to prepare, taste good, and meet your nutritional needs.  Bring to your next appt.   - Meal and snack times: (Light) breakfast no later than 1 PM; Lunch no later than 3; dinner no later than 8 PM; bedtime no later than 3 AM.  (Fit in snacks as desired, but aim for spacing out the times evenly, and try to avoid eating within 3 hrs of bedtime.) - Goal:  Move your bedtime up to no later than 2 AM.   - Best place to buy low-Na chicken broth: [employer] (or [employer] brand).   - Get back to your daily walking!  Aim for at least 30 min per day as tolerated by joints.

## 2010-09-09 NOTE — Telephone Encounter (Signed)
Called patient to come in 09/15/10 to discuss BP meds and sleep study.Busick, Rodena Medin

## 2010-09-09 NOTE — Telephone Encounter (Signed)
Please call pt and ask her to come in to discuss the medication issues.  She needs to be on a BP medication.  Ask her if there is any way to tolerate even a lower does of lisinopril until she can be seen by me.  Also, please inform her that we will go over the results of the sleep study at that visit.  She will likely need to return for a dedicated CPAP trial if she decides she would like to wear one at night.

## 2010-09-15 ENCOUNTER — Encounter: Payer: Self-pay | Admitting: Family Medicine

## 2010-09-15 ENCOUNTER — Ambulatory Visit (INDEPENDENT_AMBULATORY_CARE_PROVIDER_SITE_OTHER): Payer: Self-pay | Admitting: Family Medicine

## 2010-09-15 DIAGNOSIS — D649 Anemia, unspecified: Secondary | ICD-10-CM

## 2010-09-15 DIAGNOSIS — I10 Essential (primary) hypertension: Secondary | ICD-10-CM

## 2010-09-15 DIAGNOSIS — R32 Unspecified urinary incontinence: Secondary | ICD-10-CM | POA: Insufficient documentation

## 2010-09-15 DIAGNOSIS — G4730 Sleep apnea, unspecified: Secondary | ICD-10-CM

## 2010-09-15 NOTE — Assessment & Plan Note (Signed)
Has improved since post-op time.  Is only taking ditropan PRN.  Will d/c for now and see how pt does.  If needed, will restart.

## 2010-09-15 NOTE — Patient Instructions (Signed)
Sleep study:we will get you a 2nd part of the study to find out your CPAP settings.  Sleep hygiene: 1. Stop sleeping with the TV on at night. You can listen to music or put the TV on very very dim. 2. Keep up the great work on NOT doing work in your bed or bedroom. 3. Try not to eat anything at least 2-3 hours before bed time. 4. Try not to have ANY caffeine or sweet tea after 2pm.  5. Make sure you keep up the walking! It should help train your body to get more tired at night.  Keep up the diet and exercise suggestions from Dr. Gerilyn Pilgrim!!!  Insomnia Insomnia is frequent trouble falling and/or staying asleep. Insomnia can be a long term problem or a short term problem. Both are common. Insomnia can be a short term problem when the wakefulness is related to a certain stress or worry. Long term insomnia is often related to ongoing stress during waking hours and/or poor sleeping habits. Overtime, sleep deprivation itself can make the problem worse. Every little thing feels more severe because you are overtired and your ability to cope is decreased. SYMPTOMS  Not feeling rested in the morning.   Anxiety and restlessness at bedtime.   Difficulty falling and staying asleep.  CAUSES  Stress, anxiety, and depression.   Poor sleeping habits.   Distractions such as TV in the bedroom.   Naps close to bedtime.   Engaging in emotionally charged conversations before bed.   Technical reading before sleep.   Alcohol and other sedatives. They may make the problem worse. They can hurt normal sleep patterns and normal dream activity.   Stimulants such as caffeine for several hours prior to bedtime.   Pain syndromes and shortness of breath can cause insomnia.   Exercise late at night.   Changing time zones may cause sleeping problems (jet lag).  It is sometimes helpful to have someone observe your sleeping patterns. They should look for periods of not breathing during the night (sleep apnea).  They should also look to see how long those periods last. If you live alone or observers are uncertain, you can also be observed at a sleep clinic where your sleep patterns will be professionally monitored. Sleep apnea requires a checkup and treatment. Give your caregivers your medical history. Give your caregivers observations your family has made about your sleep.  TREATMENT  Your caregiver may prescribe treatment for an underlying medical disorders. Your caregiver can give advice or help if you are using alcohol or other drugs for self-medication. Treatment of underlying problems will usually eliminate insomnia problems.   Medications can be prescribed for short time use. They are generally not recommended for lengthy use.   Over-the-counter sleep medicines are not recommended for lengthy use. They can be habit forming.   You can promote easier sleeping by making lifestyle changes such as:   Using relaxation techniques that help with breathing and reduce muscle tension.   Exercising earlier in the day.   Changing your diet and the time of your last meal. No night time snacks.   Establish a regular time to go to bed.   Counseling can help with stressful problems and worry.   Soothing music and white noise may be helpful if there are background noises you cannot remove.   Stop tedious detailed work at least one hour before bedtime.  HOME CARE INSTRUCTIONS  Keep a diary. Inform your caregiver about your progress. This includes any  medication side effects. See your caregiver regularly. Take note of:   Times when you are asleep.  Times when you are awake during the night.   The quality of your sleep.  How you feel the next day.   This information will help your caregiver care for you.  Get out of bed if you are still awake after 15 minutes. Read or do some quiet activity. Keep the lights down. Wait until you feel sleepy and go back to bed.   Keep regular sleeping and waking hours.  Avoid naps.   Exercise regularly.   Avoid distractions at bedtime. Distractions include watching television or engaging in any intense or detailed activity like attempting to balance the household checkbook.   Develop a bedtime ritual. Keep a familiar routine of bathing, brushing your teeth, climbing into bed at the same time each night, listening to soothing music. Routines increase the success of falling to sleep faster.   Use relaxation techniques. This can be using breathing and muscle tension release routines. It can also include visualizing peaceful scenes. You can also help control troubling or intruding thoughts by keeping your mind occupied with boring or repetitive thoughts like the old concept of counting sheep. You can make it more creative like imagining planting one beautiful flower after another in your backyard garden.   During your day, work to eliminate stress. When this is not possible use some of the previous suggestions to help reduce the anxiety that accompanies stressful situations.  MAKE SURE YOU:   Understand these instructions.   Will watch your condition.   Will get help right away if you are not doing well or get worse.  Document Released: 01/28/2000 Document Re-Released: 01/13/2008 [facility] Patient Information 2011 [place], [place].

## 2010-09-15 NOTE — Assessment & Plan Note (Addendum)
BP better controlled today, lower ext edema improved.  Pt taking lasix PRN and not having any problems with HCTZ or lisinopril. Continue current regimen. F/u 3 months for BP recheck and to recheck BMET (last K 3.2).  Consider switching from HCTZ to maxzide given hypoK.

## 2010-09-15 NOTE — Assessment & Plan Note (Signed)
Will order dedicated cPAP study as recommended by sleep study.

## 2010-09-16 ENCOUNTER — Encounter: Payer: Self-pay | Admitting: Family Medicine

## 2010-09-16 NOTE — Progress Notes (Signed)
S: Pt comes in today for sleep study results.  SNORING Pt given results of sleep study.  Would like to proceed with CPAP titration as recommended by Dr. Maple Hudson at the [facility].  Pt aware that she was not able to fall asleep that night-- she often does not go to bed until 2 or 3am and was a work in for that night, so she had slept in late that morning and was not very tired when she got to the sleep center.  Does not feel well rested in the mornings when she wakes up.   HYPERTENSION Initially was concerned that the lisiopril was making her sick, but realized she had a virus and retried the medicine.  Is tolerating well without any problems or concerns.  Does not miss any doses. Taking lasix PRN LE swelling. Symptoms: Headache: No Dizziness: No Vision changes: No SOB:  No Chest pain: No LE swelling: Yes bu greatly improved Tobacco use: No  URINARY INCONTINENCE Pt taking ditropan PRN only.  Is not having the same problems of urge or stress incontinence that she was having before.  Is not needing the medication very often.  Willing to stop the medicine to see if she even needs to really take it PRN.   ROS: Per HPI  History  Smoking status  . Former Smoker  . Types: Cigarettes  . Quit date: 02/14/1995  Smokeless tobacco  . Never Used    O:  Filed Vitals:   09/15/10 1523  BP: 137/82  Pulse: 54  Temp: 99 F (37.2 C)    Gen: NAD CV: RRR, no murmur Pulm: CTA bilat, no wheezes or crackles    A/P: 45 y.o. female p/w initial medication concerns and desire for sleep study results -See problem list -f/u in 2 months

## 2010-09-16 NOTE — Assessment & Plan Note (Signed)
Pt not taking iron currently but knows that she should restart.  Will f/u at next visit.  Informed pt this should only be needed for a finite amount of time now that we have removed the source of bleeding.

## 2010-09-29 ENCOUNTER — Ambulatory Visit (INDEPENDENT_AMBULATORY_CARE_PROVIDER_SITE_OTHER): Payer: Self-pay | Admitting: Family Medicine

## 2010-09-29 ENCOUNTER — Encounter: Payer: Self-pay | Admitting: Family Medicine

## 2010-09-29 ENCOUNTER — Other Ambulatory Visit: Payer: Self-pay | Admitting: Hematology and Oncology

## 2010-09-29 VITALS — BP 134/84 | HR 65 | Ht 68.0 in | Wt 359.0 lb

## 2010-09-29 DIAGNOSIS — D239 Other benign neoplasm of skin, unspecified: Secondary | ICD-10-CM

## 2010-09-29 DIAGNOSIS — L909 Atrophic disorder of skin, unspecified: Secondary | ICD-10-CM

## 2010-09-29 DIAGNOSIS — I10 Essential (primary) hypertension: Secondary | ICD-10-CM

## 2010-09-29 DIAGNOSIS — D229 Melanocytic nevi, unspecified: Secondary | ICD-10-CM | POA: Insufficient documentation

## 2010-09-29 DIAGNOSIS — L918 Other hypertrophic disorders of the skin: Secondary | ICD-10-CM | POA: Insufficient documentation

## 2010-09-29 DIAGNOSIS — G4730 Sleep apnea, unspecified: Secondary | ICD-10-CM

## 2010-09-29 NOTE — Patient Instructions (Signed)
It was good to see you! I'm glad you're doing well. We removed some skin tags today and I sent one to pathology.  I think that it will come back benign (no cancer) but I will call you with the results. I will also call you with the results of the sleep study.  My guess if that we will need to start you on cPAP, which will maybe also help with the weight loss efforts.  It may also help with your blood pressure control (your blood pressure today was 134/84 which is much closer to our goal of  <130/80).  I don't think we need to make any medication changes at this time. I will refer to you physical therapy to see if we can help increase your range of motion in your back. Come back and see me in 1-2 months so we can check in and see how things are going.

## 2010-09-29 NOTE — Assessment & Plan Note (Addendum)
3 removed (2 on left lateral neck, 1 right lateral neck) using tweezers and scissors.  1% lido with epi (~1 cc) used to numb lesion on right side of neck; no anesthesia for left lesions. Silver nitrate applied to right lesion with successful hemostasis. Pt tolerated well.

## 2010-09-29 NOTE — Assessment & Plan Note (Addendum)
7mm flesh colored nevus on mid chest, removed via shave biopsy and sent to pathology. Area numbed with 1% lidocaine with epi. Bleeding stopped with silver nitrite. Likely benign but will f/u path report.

## 2010-10-02 ENCOUNTER — Encounter: Payer: Self-pay | Admitting: Family Medicine

## 2010-10-02 NOTE — Assessment & Plan Note (Signed)
BP better controlled today (134/84).  Not having any problems with HCTZ or lisinopril. Continue current regimen. F/u 1-2 months for BP recheck and to recheck BMET (last K 3.2).  Consider switching from HCTZ to maxzide given hypoK.

## 2010-10-02 NOTE — Assessment & Plan Note (Signed)
Pt met with nutritionist, stated she found out that her body doesn't do well with carbs, which she eats a lot of when stressed.  Is continuing to work on this.    Will f/u about if she is walking and decreasing sodas at next visit.

## 2010-10-02 NOTE — Assessment & Plan Note (Signed)
Pt w/ cPAP study later this month.  Will f/u results.  This may assist with weight loss and BP control.

## 2010-10-02 NOTE — Progress Notes (Signed)
S: Pt comes in today for skin tag removal.  SKIN TAGS Pt has 3 on neck (2 on left, 1 on right) that she would like taken off as well as one on her chest.  Have not caused her any problems, just doesn't like them and would like them removed. No h/o skin cancers or other skin problems. No itching, bleeding, or pain associated with lesions.    OBESITY Has been trying to eat better and walk more.  Is having issues with right thigh numbness with walking, esp worse when doing things such as food shopping.  No weakness, but does have tingling on outside of right thigh. Also worsened by standing or sitting in one position for too long, worst with standing.  Better once she changes position.  Happens more when walking slowly and much less often with walking quickly as when exercising.  No bowel/bladder incontinence.  Occasional low back pain but is not associated with this thigh numbness as far as pt can tell.     ROS: Per HPI  History  Smoking status  . Former Smoker  . Types: Cigarettes  . Quit date: 02/14/1995  Smokeless tobacco  . Never Used    O:  Filed Vitals:   09/29/10 1514  BP: 134/84  Pulse: 65    Gen: NAD Skin: 3 small skin tags, <55mm, 2 on left lateral neck, one on right, thin-based stalks; 6mm flesh-colored nevus on mid-sternal area with broader base   A/P: 45 y.o. female p/w skin tags -See problem list -f/u in 1-2 months   PROCEDURE NOTE Skin tags were snipped off using alcohol for cleansing and sterile iris scissors. Local anesthesia was not used on left sided lesions; 1% lidocaine with epinephrine was used for larger skin tag on right lateral neck. These pathognomonic lesions are not sent for pathology.  Patient given informed consent, signed copy in the chart. Appropriate time out taken. Area prepped with betadine.  2.5cc 1% lidocaine with epinephrine was used as anesthetic.  Sterile 10 blade scalpel was used to remove 6mm flesh colored area. Lesion was sent to  pathology.  Pressure was held on the area and silver nitrate as applied with successful hemostasis. No sutures were needed.  Area was covered with triple antibiotic ointment and bandaide.  Blood loss <10cc. Pt tolerated procedure well.

## 2010-10-03 ENCOUNTER — Encounter: Payer: Self-pay | Admitting: Family Medicine

## 2010-10-05 ENCOUNTER — Encounter: Payer: Self-pay | Admitting: Obstetrics & Gynecology

## 2010-10-05 ENCOUNTER — Ambulatory Visit (INDEPENDENT_AMBULATORY_CARE_PROVIDER_SITE_OTHER): Payer: Self-pay | Admitting: Obstetrics & Gynecology

## 2010-10-05 VITALS — BP 138/89 | HR 59 | Temp 98.8°F | Ht 68.0 in | Wt 353.6 lb

## 2010-10-05 DIAGNOSIS — Z780 Asymptomatic menopausal state: Secondary | ICD-10-CM

## 2010-10-05 MED ORDER — EST ESTROGENS-METHYLTEST 0.625-1.25 MG PO TABS: 1.0000 | ORAL_TABLET | Freq: Every day | ORAL | Status: DC

## 2010-10-05 NOTE — Progress Notes (Signed)
  Subjective:    Patient ID: [name], female    DOB: [birth year], 45 y.o.   MRN: [id number]  HPI: Pt. Had TAH/BSO 07/08/10, doing well, benign pathology. No pain but mild VMS and decreased libido, would like HRT. Doing well on oxybutinin, will continue.  Current Outpatient Prescriptions on File Prior to Visit  Medication Sig Dispense Refill  . ferrous sulfate 325 (65 FE) MG tablet Take 1 tablet (325 mg total) by mouth 2 (two) times daily.      . furosemide (LASIX) 20 MG tablet Take 1 tablet (20 mg total) by mouth daily.  90 tablet  1  . hydrochlorothiazide 25 MG tablet Take 1 tablet (25 mg total) by mouth daily.  90 tablet  1  . lisinopril (PRINIVIL,ZESTRIL) 20 MG tablet Take 1 tablet (20 mg total) by mouth daily.  90 tablet  1  . oxybutynin (DITROPAN) 5 MG tablet Take 1 tablet (5 mg total) by mouth 2 (two) times daily.  60 tablet  12     Review of Systems Incontinence improved.    Objective:   Physical Exam Filed Vitals:   10/05/10 1254  BP: 138/89  Pulse: 59  Temp: 98.8 F (37.1 C)    NAD, pleasant Abd obese, incision healing well, not tender      Assessment & Plan:  Doing well post-op 3 mo. Menopausal symptoms. Needs screening mammogram. Rx Estratest HS daily RTC 6 mo.

## 2010-10-10 ENCOUNTER — Ambulatory Visit (INDEPENDENT_AMBULATORY_CARE_PROVIDER_SITE_OTHER): Payer: Self-pay | Admitting: Family Medicine

## 2010-10-10 ENCOUNTER — Ambulatory Visit (HOSPITAL_BASED_OUTPATIENT_CLINIC_OR_DEPARTMENT_OTHER): Payer: Self-pay | Attending: Family Medicine

## 2010-10-10 DIAGNOSIS — I10 Essential (primary) hypertension: Secondary | ICD-10-CM

## 2010-10-10 DIAGNOSIS — G4710 Hypersomnia, unspecified: Secondary | ICD-10-CM | POA: Insufficient documentation

## 2010-10-10 DIAGNOSIS — G4730 Sleep apnea, unspecified: Secondary | ICD-10-CM

## 2010-10-10 DIAGNOSIS — I498 Other specified cardiac arrhythmias: Secondary | ICD-10-CM | POA: Insufficient documentation

## 2010-10-10 DIAGNOSIS — E282 Polycystic ovarian syndrome: Secondary | ICD-10-CM

## 2010-10-10 NOTE — Patient Instructions (Addendum)
-   Cheese:  All hard cheeses are high in fat and sat'd fat, i.e., 9-10 grams per ounce.  You might want to try some reduced-fat cheese, but not fat-free.  (Same recommendation for salad dressings; try Newman's Own Raspberry Walnut Vinaigrette.) - You can usually find nutritional content of restaurant foods online.  Another resource is ConnectAnalyst.se. - The pickles & olives you are eating are adding up to almost 800 mg of sodium daily.   - Usually when people reduce carb intake, especially people who have diabetes (or dysglycemias), they may find increased hunger at first, and even symptoms of low blood sugar.  This usually goes away as your body adapts, and makes more of the transporters to shuttle sugar into the brain.  - I still encourage you to try to move the bedtime up earlier, which should help to move up getting up time, and make meals start sooner.   - Write about your thoughts as to why you feel motivated to make dietary and physical activity changes now as compared to before.   - Food events:  Try to make conscious food decisions! - Reminder:  PLAN AHEAD for meals and snacks, and increase home preparation and less takeout.   - Meals list:  Post your meals list in the kitchen, and use it as a basis for shopping and for deciding dinner.   - Consider weight management class that starts at [facility] Sept 4.  (Call/email if interested.)

## 2010-10-10 NOTE — Progress Notes (Signed)
Medical Nutrition Therapy:  Appt start time: 1600 end time:  1630.  Assessment:  Primary concerns today: Weight management and hypertension.  Although [name]'s weight is up 4 lb since first MNT appt, she has made some significant changes.  [name] has switched to all diet drinks, although she has not been drinking any plain water.  She  has determined that she feels better when limiting carbohydrates.  In addn, she has found out that meal planning is difficult for her, especially with her husband, who is very thin, and can eat anything.  She said he is just not going to eat "that way," and recently has started to call [name] a "tree hugger" for the healthy foods she is eating, e.g., he will not go to eat with her at [facility].  [name] has been walking ~1 mile (20-25 min) 7 X wk.  She has been sleeping better since beginning estrogen last week, although she is not usually getting to bed earlier than 3 AM.  She is going for a sleep study tonight.  24-hr recall: B (12 PM)- 1/2 pimiento chs sandwich on sourdough, Crystal Light; Snk- water; D (5 PM)- fried chx tenders, vegall casserole, swt potato casserole, devilled eggs, green beans, 1/2 brownie, 1/2 piece pound cake, diet swt tea; Snk (9 PM)- [employer] chsburger w/ reduced-fat mayo, diet gingerale.  Yesterday was atypical in that she went to a funeral, w/ reception after.   Progress Towards Goal(s):  In progress.   Nutritional Diagnosis: Progress noted on NB-2.1 Physical inactivity as related to fatigue and time constraints as evidenced by physical activity increased to 1 mi (20-25 min) walking daily.  No change in NI-1.5 Excessive energy intake as related to expenditure as evidenced by weight gain of 4 lb in one month.    Intervention:  Nutrition education.  Monitoring/Evaluation:  Dietary intake, exercise, and body weight in 1 month.

## 2010-10-15 DIAGNOSIS — G4730 Sleep apnea, unspecified: Secondary | ICD-10-CM

## 2010-10-15 DIAGNOSIS — G4710 Hypersomnia, unspecified: Secondary | ICD-10-CM

## 2010-10-15 NOTE — Procedures (Signed)
NAME:  [name], [name]                  ACCOUNT NO.:  [id number]  MEDICAL RECORD NO.:  [id number]          PATIENT TYPE:  OUT  LOCATION:  SLEEP CENTER                 FACILITY:  [facility]  PHYSICIAN:  Clinton D. Maple Hudson, MD, FCCP, FACPDATE OF BIRTH:  1965/04/23  DATE OF STUDY:  10/10/2010                           NOCTURNAL POLYSOMNOGRAM  REFERRING PHYSICIAN:  Demetria Pore, MD  REFERRING PHYSICIAN:  Demetria Pore, MD  INDICATION FOR STUDY:  Hypersomnia with sleep apnea.  EPWORTH SLEEPINESS SCORE:  10/24.  BMI 55.6.  Weight 366 pounds, height 68 inches.  Neck 17 inches.  A baseline diagnostic NPSG on August 29, 2010 recorded an AHI of 33 per hour.  CPAP titration is now requested.  MEDICATIONS:  Home medications are charted and reviewed.  SLEEP ARCHITECTURE:  Total sleep time 267.5 minutes with sleep efficiency 72.9%.  Stage I was 9%, stage II 68%, stage III 7.1%, REM 15.9% of total sleep time.  Sleep latency 52.5 minutes, REM latency 87 minutes, awake after sleep onset 47 minutes, arousal index 18.2.  BEDTIME MEDICATION:  None.  RESPIRATORY DATA:  CPAP titration protocol.  CPAP was titrated to 13 CWP, AHI 0 per hour.  She wore a medium ResMed Mirage FX full-face mask with heated humidifier and a C-Flex setting of 3.  OXYGEN DATA:  Snoring was prevented by CPAP and mean oxygen saturation held 94.4% on room air.  CARDIAC DATA:  Sinus arrhythmia.  MOVEMENT-PARASOMNIA:  Few limb jerks were noted with insignificant effect on sleep.  Bathroom x1.  IMPRESSIONS-RECOMMENDATIONS: 1. Successful continuous positive airway pressure titration to 13 mL     CWP, AHI 0 per hour.  She wore a medium ResMed Mirage FX full-face     mask with heated humidifier and a C-Flex setting of 3.  Snoring was     prevented     and oxygenation improved. 2. Baseline diagnostic NPSG on August 29, 2010 had recorded an AHI of 33     per hour.     Clinton D. Maple Hudson, MD, [facility], FACP Diplomate, Biomedical engineer  of Sleep Medicine Electronically Signed    CDY/MEDQ  D:  10/15/2010 09:37:22  T:  10/15/2010 10:07:37  Job:  [id number]

## 2010-10-18 ENCOUNTER — Ambulatory Visit: Payer: Self-pay | Attending: Family Medicine | Admitting: *Deleted

## 2010-10-18 ENCOUNTER — Ambulatory Visit: Payer: Self-pay | Admitting: Family Medicine

## 2010-10-18 ENCOUNTER — Ambulatory Visit (INDEPENDENT_AMBULATORY_CARE_PROVIDER_SITE_OTHER): Payer: Self-pay | Admitting: Family Medicine

## 2010-10-18 DIAGNOSIS — M2560 Stiffness of unspecified joint, not elsewhere classified: Secondary | ICD-10-CM | POA: Insufficient documentation

## 2010-10-18 DIAGNOSIS — M545 Low back pain, unspecified: Secondary | ICD-10-CM | POA: Insufficient documentation

## 2010-10-18 DIAGNOSIS — IMO0001 Reserved for inherently not codable concepts without codable children: Secondary | ICD-10-CM | POA: Insufficient documentation

## 2010-10-18 DIAGNOSIS — E669 Obesity, unspecified: Secondary | ICD-10-CM

## 2010-10-24 ENCOUNTER — Ambulatory Visit: Payer: Self-pay | Admitting: Physical Therapy

## 2010-10-25 ENCOUNTER — Ambulatory Visit (INDEPENDENT_AMBULATORY_CARE_PROVIDER_SITE_OTHER): Payer: Self-pay | Admitting: Family Medicine

## 2010-10-25 DIAGNOSIS — E669 Obesity, unspecified: Secondary | ICD-10-CM

## 2010-10-26 ENCOUNTER — Ambulatory Visit: Payer: Self-pay | Admitting: Physical Therapy

## 2010-10-31 ENCOUNTER — Ambulatory Visit: Payer: Self-pay | Admitting: Physical Therapy

## 2010-11-01 ENCOUNTER — Telehealth: Payer: Self-pay | Admitting: Family Medicine

## 2010-11-01 ENCOUNTER — Ambulatory Visit (INDEPENDENT_AMBULATORY_CARE_PROVIDER_SITE_OTHER): Payer: Self-pay | Admitting: Family Medicine

## 2010-11-01 DIAGNOSIS — E669 Obesity, unspecified: Secondary | ICD-10-CM

## 2010-11-01 DIAGNOSIS — G4730 Sleep apnea, unspecified: Secondary | ICD-10-CM

## 2010-11-01 NOTE — Telephone Encounter (Signed)
Wants to know results of sleep study done on Aug 27th.

## 2010-11-01 NOTE — Telephone Encounter (Signed)
Will forward to Dr McGill 

## 2010-11-02 ENCOUNTER — Telehealth: Payer: Self-pay | Admitting: *Deleted

## 2010-11-02 NOTE — Telephone Encounter (Signed)
Pt left message yesterday stating that she saw Dr. Debroah Loop on 8/22 and  needs a mammogram scheduled.

## 2010-11-02 NOTE — Telephone Encounter (Signed)
Pt returned call and stated that she has an orange card. I scheduled her appt for mammo on 11/10/10 @ 3pm.  I called pt and informed her of appt details. Pt voiced understanding

## 2010-11-02 NOTE — Telephone Encounter (Signed)
Left message for pt that I was returning her call. Please call back and state her insurance carrier or if she will need an application for mammogram scholarship. We can schedule the appt once we have that information.

## 2010-11-03 ENCOUNTER — Ambulatory Visit: Payer: Self-pay | Admitting: Physical Therapy

## 2010-11-06 ENCOUNTER — Other Ambulatory Visit: Payer: Self-pay | Admitting: Family Medicine

## 2010-11-06 NOTE — Telephone Encounter (Signed)
It showed that she would benefit greatly from cPAP.  I will put in the order as soon as I figure out how and then home health should come out and set it up for her.  Recs are 13mL CWP with medium ResMed Mirage FX full-face mask w/ heated humidifier and C-Flex setting of 3 (for my records).

## 2010-11-07 ENCOUNTER — Ambulatory Visit: Payer: Self-pay | Admitting: Physical Therapy

## 2010-11-07 ENCOUNTER — Ambulatory Visit: Payer: Self-pay | Admitting: Family Medicine

## 2010-11-08 NOTE — Telephone Encounter (Signed)
Addended by: Demetria Pore A on: 11/08/2010 01:47 PM   Modules accepted: Orders

## 2010-11-09 ENCOUNTER — Ambulatory Visit: Payer: Self-pay | Admitting: Physical Therapy

## 2010-11-10 ENCOUNTER — Ambulatory Visit (HOSPITAL_COMMUNITY)
Admission: RE | Admit: 2010-11-10 | Discharge: 2010-11-10 | Disposition: A | Discharge status: Home / Self Care | Payer: Self-pay | Source: Ambulatory Visit | Attending: Obstetrics & Gynecology | Admitting: Obstetrics & Gynecology

## 2010-11-10 DIAGNOSIS — Z780 Asymptomatic menopausal state: Secondary | ICD-10-CM

## 2010-11-10 DIAGNOSIS — Z1231 Encounter for screening mammogram for malignant neoplasm of breast: Secondary | ICD-10-CM | POA: Insufficient documentation

## 2010-11-14 ENCOUNTER — Ambulatory Visit: Payer: Self-pay | Attending: Family Medicine | Admitting: Physical Therapy

## 2010-11-14 DIAGNOSIS — M2560 Stiffness of unspecified joint, not elsewhere classified: Secondary | ICD-10-CM | POA: Insufficient documentation

## 2010-11-14 DIAGNOSIS — IMO0001 Reserved for inherently not codable concepts without codable children: Secondary | ICD-10-CM | POA: Insufficient documentation

## 2010-11-14 DIAGNOSIS — M545 Low back pain, unspecified: Secondary | ICD-10-CM | POA: Insufficient documentation

## 2010-11-16 NOTE — Telephone Encounter (Signed)
I put in home health order. They should be coming out and setting up her machine.

## 2010-11-16 NOTE — Telephone Encounter (Signed)
Calling back about the sleep study.  She hadn't heard anything back yet.

## 2010-11-17 ENCOUNTER — Ambulatory Visit: Payer: Self-pay | Admitting: Physical Therapy

## 2010-11-17 ENCOUNTER — Other Ambulatory Visit: Payer: Self-pay | Admitting: Obstetrics & Gynecology

## 2010-11-17 DIAGNOSIS — R928 Other abnormal and inconclusive findings on diagnostic imaging of breast: Secondary | ICD-10-CM

## 2010-11-29 ENCOUNTER — Ambulatory Visit
Admission: RE | Admit: 2010-11-29 | Discharge: 2010-11-29 | Disposition: A | Discharge status: Home / Self Care | Payer: No Typology Code available for payment source | Source: Ambulatory Visit | Attending: Obstetrics & Gynecology | Admitting: Obstetrics & Gynecology

## 2010-11-29 DIAGNOSIS — R928 Other abnormal and inconclusive findings on diagnostic imaging of breast: Secondary | ICD-10-CM

## 2010-12-07 ENCOUNTER — Encounter: Payer: Self-pay | Admitting: Family Medicine

## 2010-12-07 ENCOUNTER — Ambulatory Visit (INDEPENDENT_AMBULATORY_CARE_PROVIDER_SITE_OTHER): Payer: Self-pay | Admitting: Family Medicine

## 2010-12-07 DIAGNOSIS — I10 Essential (primary) hypertension: Secondary | ICD-10-CM

## 2010-12-07 DIAGNOSIS — G4730 Sleep apnea, unspecified: Secondary | ICD-10-CM

## 2010-12-07 DIAGNOSIS — D649 Anemia, unspecified: Secondary | ICD-10-CM

## 2010-12-07 LAB — CBC
HCT: 45 % (ref 36.0–46.0)
Hemoglobin: 14.9 g/dL (ref 12.0–15.0)
MCHC: 33.1 g/dL (ref 30.0–36.0)
MCV: 92 fL (ref 78.0–100.0)
RDW: 13.9 % (ref 11.5–15.5)

## 2010-12-07 LAB — BASIC METABOLIC PANEL
CO2: 29 mEq/L (ref 19–32)
Calcium: 9.2 mg/dL (ref 8.4–10.5)
Glucose, Bld: 93 mg/dL (ref 70–99)
Potassium: 4.3 mEq/L (ref 3.5–5.3)
Sodium: 139 mEq/L (ref 135–145)

## 2010-12-07 NOTE — Assessment & Plan Note (Signed)
BP at goal today. Continue current txt. Will check BMET.  BP Readings from Last 3 Encounters:  12/07/10 106/70  10/05/10 138/89  09/29/10 134/84

## 2010-12-07 NOTE — Assessment & Plan Note (Addendum)
Still has not gotten cPAP machine.  Called [facility] services today while pt was in clinic; they will need to look into how to do home cPAP w/ orange card pt and will call back tomorrow.  Informed pt we will call her when we have more information, hopefully by the end of the week.

## 2010-12-07 NOTE — Patient Instructions (Addendum)
It was great to see you today! Your blood pressure looks great. We will not change any of your medications today. We are checking your electrolytes to make sure that your potassium is normal today. You can expect a letter with the results of this test in the next few weeks. We talked with home health today.  They will call us and let us know what happened with the referral.  We will call you and let you know what is going on before the weekend.   Please come back and see me in 3 months so we can recheck your blood pressure and see how you're tolerating the CPAP.

## 2010-12-07 NOTE — Assessment & Plan Note (Signed)
Pt still not taking iron.  Will check CBC today, if normal (which is may be since bleeding source has been removed), will tell pt it is ok to stop.

## 2010-12-07 NOTE — Assessment & Plan Note (Signed)
Decreased salt and sweet drinks. Overall lost 12# in past 3 months. Encouraged pt to continue.   Wt Readings from Last 3 Encounters:  12/07/10 354 lb (160.573 kg)  10/10/10 356 lb 12.8 oz (161.843 kg)  10/05/10 353 lb 9.6 oz (160.392 kg)

## 2010-12-07 NOTE — Progress Notes (Signed)
S: Pt comes in today for follow up.  HYPERTENSION BP: 106/70 Meds: HCTZ, lisinopril, lasix PRN Taking meds: Yes     # of doses missed/week: 0 Symptoms: Headache: No Dizziness: No Vision changes: No SOB:  No Chest pain: No LE swelling: No Tobacco use: No Diet: decreased salt, cut out sweet drinks   OBESITY Has slowly been losing weight.  Has met with Dr. Gerilyn Pilgrim and did her 6 week program.  Is planning on making another appt w/ Dr. Gerilyn Pilgrim.  Has tried to stop putting salt on her food and has cut out sweet drinks.  Per pt, she knows how she needs to eat, just is difficult to find the time to prepare meals and the money to do it.  SLEEP APNEA Still has not gotten cPAP machine despite HH referral being put in 1 month ago.  Feels like she really needs this.  Still with night time awakenings and very tired during day.    ROS: Per HPI  History  Smoking status  . Former Smoker  . Types: Cigarettes  . Quit date: 02/14/1995  Smokeless tobacco  . Never Used    O:  Filed Vitals:   12/07/10 1559  BP: 106/70  Pulse: 73    Gen: NAD CV: RRR, no murmur Pulm: CTA bilat, no wheezes or crackles Abd: soft, NT, obese Ext: Warm, no chronic skin changes, no edema   A/P: 45 y.o. female p/w OSA, obesity, HTN  -See problem list -f/u in 3 months

## 2010-12-08 ENCOUNTER — Encounter: Payer: Self-pay | Admitting: Family Medicine

## 2010-12-12 ENCOUNTER — Telehealth: Payer: Self-pay | Admitting: Family Medicine

## 2010-12-12 NOTE — Telephone Encounter (Signed)
Also, LMOM for pt advising her that I faxed all info to [facility] and spoke to a [place] and that if she wanted to F/U on it ina few days she can, but I would call her back when I heard something.

## 2010-12-12 NOTE — Telephone Encounter (Signed)
Spoke to [place] at [facility] me to fax info to her and she would address. Sleep Study, demo,copy of orange card, etc all faxed to her.

## 2010-12-12 NOTE — Telephone Encounter (Signed)
Is asking about what Dr Fara Boros was able to find out about her Cpap mask.  pls advise

## 2011-01-13 ENCOUNTER — Emergency Department (HOSPITAL_COMMUNITY)
Admission: EM | Admit: 2011-01-13 | Discharge: 2011-01-13 | Disposition: A | Discharge status: Home / Self Care | Payer: Self-pay | Attending: Emergency Medicine | Admitting: Emergency Medicine

## 2011-01-13 ENCOUNTER — Emergency Department (HOSPITAL_COMMUNITY): Payer: Self-pay

## 2011-01-13 ENCOUNTER — Encounter (HOSPITAL_COMMUNITY): Payer: Self-pay | Admitting: Emergency Medicine

## 2011-01-13 ENCOUNTER — Ambulatory Visit: Payer: Self-pay | Admitting: Family Medicine

## 2011-01-13 DIAGNOSIS — F3289 Other specified depressive episodes: Secondary | ICD-10-CM | POA: Insufficient documentation

## 2011-01-13 DIAGNOSIS — F329 Major depressive disorder, single episode, unspecified: Secondary | ICD-10-CM | POA: Insufficient documentation

## 2011-01-13 DIAGNOSIS — I10 Essential (primary) hypertension: Secondary | ICD-10-CM | POA: Insufficient documentation

## 2011-01-13 DIAGNOSIS — Z79899 Other long term (current) drug therapy: Secondary | ICD-10-CM | POA: Insufficient documentation

## 2011-01-13 DIAGNOSIS — R609 Edema, unspecified: Secondary | ICD-10-CM | POA: Insufficient documentation

## 2011-01-13 DIAGNOSIS — R079 Chest pain, unspecified: Secondary | ICD-10-CM | POA: Insufficient documentation

## 2011-01-13 DIAGNOSIS — Z8249 Family history of ischemic heart disease and other diseases of the circulatory system: Secondary | ICD-10-CM | POA: Insufficient documentation

## 2011-01-13 DIAGNOSIS — Z9889 Other specified postprocedural states: Secondary | ICD-10-CM | POA: Insufficient documentation

## 2011-01-13 LAB — BASIC METABOLIC PANEL
BUN: 25 mg/dL — ABNORMAL HIGH (ref 6–23)
Calcium: 9.2 mg/dL (ref 8.4–10.5)
GFR calc Af Amer: 78 mL/min — ABNORMAL LOW (ref >90)
GFR calc non Af Amer: 67 mL/min — ABNORMAL LOW (ref >90)
Potassium: 3.7 mEq/L (ref 3.5–5.1)
Sodium: 139 mEq/L (ref 135–145)

## 2011-01-13 LAB — CBC
MCH: 31.6 pg (ref 26.0–34.0)
MCHC: 33.6 g/dL (ref 30.0–36.0)
RDW: 13.5 % (ref 11.5–15.5)

## 2011-01-13 MED ORDER — OMEPRAZOLE 20 MG PO CPDR: 20.0000 mg | DELAYED_RELEASE_CAPSULE | Freq: Every day | ORAL | Status: DC

## 2011-01-13 NOTE — ED Notes (Signed)
PT. REPORTS LEFT CHEST PAIN RADIATING TO LEFT JAW ONSET YESTERDAY ,  NO SOB ,  NO  COUGH OR CONGESTION , DENIES NAUSEA OR VOMITTING , DENIES DIAPHORESIS,  FEL LIKE "INDIGESTION".

## 2011-01-13 NOTE — ED Notes (Signed)
PT to ED c/o intermittent chest burning for several days.  Pt denies nausea, diaphoresis or sob, but states increase in bil LE edema (pt takes lasix for LE edema).

## 2011-01-13 NOTE — ED Provider Notes (Signed)
Medical screening examination/treatment/procedure(s) were conducted as a shared visit with non-physician practitioner(s) and myself.  I personally evaluated the patient during the encounter  The patient's story is atypical for ACS.  EKG and enzymes are normal.  DC home on Prilosec with cardiology and PCP followup  I personally reviewed the x-ray  1. Chest pain      Lyanne Co, MD 01/13/11 [phone]

## 2011-01-13 NOTE — ED Notes (Signed)
EDP at pt bedside 

## 2011-01-13 NOTE — ED Notes (Signed)
Pt states chest burning is unchanged - 1/10.  Husband at bedside.  Informed of test results.

## 2011-01-13 NOTE — ED Provider Notes (Signed)
History     CSN: [id number] Arrival date & time: 01/13/2011  4:10 AM   First MD Initiated Contact with Patient 01/13/11 0534      Chief Complaint  Patient presents with  . Chest Pain    (Consider location/radiation/quality/duration/timing/severity/associated sxs/prior treatment) Patient is a 45 y.o. female presenting with chest pain. The history is provided by the patient.  Chest Pain Episode onset: She reports episode of not feeling well associatd with left chest pain 2 days ago that lasted approximately 15 minutes. She has had a burning type chest discomfort since that time without radiation. Pertinent negatives for primary symptoms include no fever, no shortness of breath, no nausea and no vomiting.  Pertinent negatives for past medical history include no diabetes and no hyperlipidemia.  Her family medical history is significant for CAD in family. Family history comments: Father with late onset MI (after 65 years of age)     Past Medical History  Diagnosis Date  . Hypertension   . Cystocele   . Rectocele   . Dysfunctional uterine bleeding   . Anemia     Hgb 5 02/18/10, blood transfusion 02/18/10  . Depression   . PCOS (polycystic ovarian syndrome)   . Obesity, Class III, BMI 40-49.9 (morbid obesity)     Past Surgical History  Procedure Date  . Cholecystectomy 1993  . Vaginal delivery 1993  . Abdominal hysterectomy     Family History  Problem Relation Age of Onset  . Colon cancer Maternal Uncle   . Hyperlipidemia Mother   . Hypertension Mother   . Hyperlipidemia Father   . Hypertension Father   . Heart attack Father 22    5-bypass surgery  . Diabetes Maternal Aunt   . Cancer Paternal Uncle     colon    History  Substance Use Topics  . Smoking status: Former Smoker    Types: Cigarettes    Quit date: 02/14/1995  . Smokeless tobacco: Never Used  . Alcohol Use: No    OB History    Grav Para Term Preterm Abortions TAB SAB Ect Mult Living   1 1 0 0 0 0 0 0  0 1      Review of Systems  Constitutional: Negative.  Negative for fever and chills.  HENT: Negative.   Respiratory: Negative.  Negative for shortness of breath.   Cardiovascular: Positive for chest pain.  Gastrointestinal: Negative.  Negative for nausea and vomiting.  Musculoskeletal: Negative.   Skin: Negative.   Neurological: Negative.     Allergies  Review of patient's allergies indicates no known allergies.  Home Medications   Current Outpatient Rx  Name Route Sig Dispense Refill  . EST ESTROGENS-METHYLTEST 0.625-1.25 MG PO TABS Oral Take 1 tablet by mouth daily. 30 tablet 6  . FUROSEMIDE 20 MG PO TABS Oral Take 1 tablet (20 mg total) by mouth daily. 90 tablet 1    Take in the morning.  Marland Kitchen HYDROCHLOROTHIAZIDE 25 MG PO TABS Oral Take 1 tablet (25 mg total) by mouth daily. 90 tablet 1  . LISINOPRIL 20 MG PO TABS Oral Take 1 tablet (20 mg total) by mouth daily. 90 tablet 1    If you experience dizziness or lightheadedness, pl ...  . OXYBUTYNIN CHLORIDE 5 MG PO TABS Oral Take 1 tablet (5 mg total) by mouth 2 (two) times daily. 60 tablet 12    BP 139/76  Pulse 55  Temp(Src) 97.8 F (36.6 C) (Oral)  Resp 21  SpO2 100%  LMP 03/21/2010  Physical Exam  Constitutional: She appears well-developed and well-nourished.  HENT:  Head: Normocephalic.  Neck: Normal range of motion. Neck supple.  Cardiovascular: Normal rate and regular rhythm.   Pulmonary/Chest: Effort normal and breath sounds normal.  Abdominal: Soft. Bowel sounds are normal. There is no tenderness. There is no rebound and no guarding.       Morbidly obese.  Musculoskeletal: Normal range of motion.       Mild LE edema.  Neurological: She is alert. No cranial nerve deficit.  Skin: Skin is warm and dry. No rash noted.  Psychiatric: She has a normal mood and affect.    ED Course  Procedures (including critical care time)  Labs Reviewed  BASIC METABOLIC PANEL - Abnormal; Notable for the following:     Glucose, Bld 100 (*)    BUN 25 (*)    GFR calc non Af Amer 67 (*)    GFR calc Af Amer 78 (*)    All other components within normal limits  CBC  POCT I-STAT TROPONIN I  I-STAT TROPONIN I   No results found.   No diagnosis found.    MDM   Date: 01/13/2011  Rate: 52  Rhythm: normal sinus rhythm  QRS Axis: right  Intervals: normal  ST/T Wave abnormalities: normal  Conduction Disutrbances:first-degree A-V block   Narrative Interpretation:   Old EKG Reviewed: changes noted          Rodena Medin, PA 01/13/11 [phone]

## 2011-01-23 ENCOUNTER — Ambulatory Visit (INDEPENDENT_AMBULATORY_CARE_PROVIDER_SITE_OTHER): Payer: Self-pay | Admitting: Family Medicine

## 2011-01-23 ENCOUNTER — Encounter: Payer: Self-pay | Admitting: Family Medicine

## 2011-01-23 VITALS — BP 149/82 | HR 71 | Temp 98.8°F | Ht 67.0 in | Wt 356.6 lb

## 2011-01-23 DIAGNOSIS — Z23 Encounter for immunization: Secondary | ICD-10-CM

## 2011-01-23 DIAGNOSIS — M7710 Lateral epicondylitis, unspecified elbow: Secondary | ICD-10-CM

## 2011-01-23 NOTE — Progress Notes (Signed)
  Subjective:    Patient ID: [name], female    DOB: November 12, 1965, 45 y.o.   MRN: [id number]  HPI left elbow pain: Patient reports left elbow pain since Saturday morning.initially had some aching in her neck. This resolved and then began to have pain, dull, aching in left lateral elbow area. Took oxycodone on ( which had left over from hysterectomy), and Motrin-without relief. Patient concerned that she has a pinched nerve. No problems with sensation. Normal strength. No pain in shoulder. No skin changes. Heat and not moving the arm seemed to make it better. Movement seems to make it worse.  Patient requesting flu vaccine today.    Review of Systems As per above    Objective:   Physical Exam  Constitutional: She is oriented to person, place, and time. She appears well-developed and well-nourished.       Obese  Cardiovascular: Normal rate.   Pulmonary/Chest: Effort normal. No respiratory distress.  Musculoskeletal:       Left arm exam: Tenderness over lateral epicondyles on exam of left arm. No pain with flexion, extension, pronation or supination against resistance. Full range of motion at shoulder and elbow. No skin changes. Normal sensation. Strength within normal limits.  Neck exam: For range of motion. No tenderness on palpation.  Neurological: She is alert and oriented to person, place, and time.          Assessment & Plan:

## 2011-01-25 ENCOUNTER — Encounter: Payer: Self-pay | Admitting: Obstetrics & Gynecology

## 2011-01-25 ENCOUNTER — Ambulatory Visit (INDEPENDENT_AMBULATORY_CARE_PROVIDER_SITE_OTHER): Payer: Self-pay | Admitting: Obstetrics & Gynecology

## 2011-01-25 ENCOUNTER — Encounter: Payer: Self-pay | Admitting: Family Medicine

## 2011-01-25 ENCOUNTER — Ambulatory Visit (INDEPENDENT_AMBULATORY_CARE_PROVIDER_SITE_OTHER): Payer: Self-pay | Admitting: Family Medicine

## 2011-01-25 VITALS — BP 137/84 | HR 53 | Temp 98.2°F | Ht 68.0 in | Wt 355.0 lb

## 2011-01-25 VITALS — BP 123/76 | HR 68 | Temp 97.2°F | Ht 68.0 in | Wt 354.3 lb

## 2011-01-25 DIAGNOSIS — M5412 Radiculopathy, cervical region: Secondary | ICD-10-CM

## 2011-01-25 DIAGNOSIS — IMO0002 Reserved for concepts with insufficient information to code with codable children: Secondary | ICD-10-CM

## 2011-01-25 DIAGNOSIS — N8111 Cystocele, midline: Secondary | ICD-10-CM

## 2011-01-25 HISTORY — DX: Radiculopathy, cervical region: M54.12

## 2011-01-25 MED ORDER — GABAPENTIN 300 MG PO CAPS: 300.0000 mg | ORAL_CAPSULE | Freq: Every day | ORAL | Status: DC

## 2011-01-25 MED ORDER — TRAMADOL HCL 50 MG PO TABS: 50.0000 mg | ORAL_TABLET | Freq: Four times a day (QID) | ORAL | Status: AC | PRN

## 2011-01-25 MED ORDER — ESTRADIOL 1 MG PO TABS: 1.0000 mg | ORAL_TABLET | Freq: Every day | ORAL | Status: DC

## 2011-01-25 NOTE — Assessment & Plan Note (Signed)
Symptoms consistent with mild cervical radiculopathy. No red flags her symptoms are present today. Pain is not relieved with ibuprofen and heating pad. Will treat with tramadol as needed and was gabapentin at night. Will followup with PCP in 2 weeks. Handout on neck exercises and posture given. Red flags reviewed with patient who expresses understanding.

## 2011-01-25 NOTE — Progress Notes (Addendum)
  Subjective:    Patient ID: [name], female    DOB: 11-08-1965, 45 y.o.   MRN: [id number]  HPIPatient's last menstrual period was 03/21/2010. G1P0001 who notes oily facial skin and no improvement of libido on her curretn HRT. Want to continue ERT. Vaginal dryness is improved. Some straining with BM, uses Miralax prn. Scheduled Meds:   Continuous Infusions:   PRN Meds:.  Current outpatient prescriptions:furosemide (LASIX) 20 MG tablet, Take 1 tablet (20 mg total) by mouth daily., Disp: 90 tablet, Rfl: 1;  hydrochlorothiazide 25 MG tablet, Take 1 tablet (25 mg total) by mouth daily., Disp: 90 tablet, Rfl: 1;  lisinopril (PRINIVIL,ZESTRIL) 20 MG tablet, Take 1 tablet (20 mg total) by mouth daily., Disp: 90 tablet, Rfl: 1 oxybutynin (DITROPAN) 5 MG tablet, Take 1 tablet (5 mg total) by mouth 2 (two) times daily., Disp: 60 tablet, Rfl: 12;  estradiol (ESTRACE) 1 MG tablet, Take 1 tablet (1 mg total) by mouth daily., Disp: 30 tablet, Rfl: 12;  omeprazole (PRILOSEC) 20 MG capsule, Take 1 capsule (20 mg total) by mouth daily., Disp: 30 capsule, Rfl: 0 No Known Allergies    Review of Systems As above    Objective:   Physical Exam Filed Vitals:   01/25/11 1318  Height: 5\' 8"  (1.727 m)  Weight: 354 lb 4.8 oz (160.709 kg)    NAD, pleasant Pelvic deferred     Assessment & Plan:  Side effects of testosterone Rx estradiol 1mg  daily Miralax QOD RTC 6 months  Fancy Dunkley 01/25/11

## 2011-01-25 NOTE — Patient Instructions (Signed)

## 2011-01-25 NOTE — Progress Notes (Signed)
45 year old right-handed female with 5 days of left arm pain. She developed pain in her left neck shoulder elbow and into her hand starting Saturday. She notes that sitting at her desk working and sleeping is particularly painful. She has been using a heating pad and taking ibuprofen 600 mg but only partially relieves her pain. She saw Dr. Edmonia James on Monday when she had elbow pain and was diagnosed with lateral epicondylitis.  Today she notes pain in her left trapezius and tingling sensation in her left first and second fingers on the dorsal side. She feels well otherwise denies any right hand symptoms weakness numbness or extremity radicular pain.  Additionally she denies any bowel or bladder dysfunction or gait problems.  PMH reviewed.  ROS as above otherwise neg Medications reviewed. Current Outpatient Prescriptions  Medication Sig Dispense Refill  . estradiol (ESTRACE) 1 MG tablet Take 1 tablet (1 mg total) by mouth daily.  30 tablet  12  . furosemide (LASIX) 20 MG tablet Take 1 tablet (20 mg total) by mouth daily.  90 tablet  1  . gabapentin (NEURONTIN) 300 MG capsule Take 1 capsule (300 mg total) by mouth at bedtime.  30 capsule  1  . hydrochlorothiazide 25 MG tablet Take 1 tablet (25 mg total) by mouth daily.  90 tablet  1  . lisinopril (PRINIVIL,ZESTRIL) 20 MG tablet Take 1 tablet (20 mg total) by mouth daily.  90 tablet  1  . omeprazole (PRILOSEC) 20 MG capsule Take 1 capsule (20 mg total) by mouth daily.  30 capsule  0  . oxybutynin (DITROPAN) 5 MG tablet Take 1 tablet (5 mg total) by mouth 2 (two) times daily.  60 tablet  12  . traMADol (ULTRAM) 50 MG tablet Take 1 tablet (50 mg total) by mouth every 6 (six) hours as needed for pain. Maximum dose= 8 tablets per day  60 tablet  1    Exam:  BP 137/84  Pulse 53  Temp(Src) 98.2 F (36.8 C) (Oral)  Ht 5\' 8"  (1.727 m)  Wt 355 lb (161.027 kg)  BMI 53.98 kg/m2  LMP 03/21/2010 Gen: Well NAD, obese HEENT: EOMI,  MMM, normal neck range of  motion.   Exts: Non edematous BL  LE, warm and well perfused.  MSK: Nontender over spinal midline.  Shoulder range of motion and strength are intact. Negative Hawkins Mnire's and empty can. Nontender over lateral upper condyle.  Reflexes are diminished but equal bilaterally.  Grip strength in the hand wrist strength is normal. Sensation is intact of the entire upper extremity. Gait is normal.  Spurling's test produces pain in her left neck but no significant radicular symptoms.

## 2011-01-25 NOTE — Patient Instructions (Signed)
Thank you for coming in today. I prescribed tramadol to use throughout the day as needed and gabapentin to use at night.  Try taking tramadol to see how it makes her feel some people it makes  Sleepy. Keep a lookout for new weakness or worsening pain on both sides, or problems with bowels or bladder See Dr. Fara Boros on the 26th. Try to stay active  Cervical Strain and Sprain (Whiplash) with Rehab Cervical strain and sprains are injuries that commonly occur with "whiplash" injuries. Whiplash occurs when the neck is forcefully whipped backward or forward, such as during a motor vehicle accident. The muscles, ligaments, tendons, discs and nerves of the neck are susceptible to injury when this occurs. SYMPTOMS    Pain or stiffness in the front and/or back of neck     Symptoms may present immediately or up to 24 hours after injury.     Dizziness, headache, nausea and vomiting.     Muscle spasm with soreness and stiffness in the neck.     Tenderness and swelling at the injury site.  CAUSES   Whiplash injuries often occur during contact sports or motor vehicle accidents.   RISK INCREASES WITH:  Osteoarthritis of the spine.     Situations that make head or neck accidents or trauma more likely.     High-risk sports (football, rugby, wrestling, hockey, auto racing, gymnastics, diving, contact karate or boxing).     Poor strength and flexibility of the neck.     Previous neck injury.     Poor tackling technique.     Improperly fitted or padded equipment.  PREVENTION  Learn and use proper technique (avoid tackling with the head, spearing and head-butting; use proper falling techniques to avoid landing on the head).     Warm up and stretch properly before activity.     Maintain physical fitness:     Strength, flexibility and endurance.     Cardiovascular fitness.     Wear properly fitted and padded protective equipment, such as padded soft collars, for participation in contact  sports.  PROGNOSIS   Recovery for cervical strain and sprain injuries is dependent on the extent of the injury. These injuries are usually curable in 1 week to 3 months with appropriate treatment.   RELATED COMPLICATIONS    Temporary numbness and weakness may occur if the nerve roots are damaged, and this may persist until the nerve has completely healed.     Chronic pain due to frequent recurrence of symptoms.     Prolonged healing, especially if activity is resumed too soon (before complete recovery).  TREATMENT   Treatment initially involves the use of ice and medication to help reduce pain and inflammation. It is also important to perform strengthening and stretching exercises and modify activities that worsen symptoms so the injury does not get worse. These exercises may be performed at home or with a therapist. For patients who experience severe symptoms, a soft padded collar may be recommended to be worn around the neck.   Improving your posture may help reduce symptoms. Posture improvement includes pulling your chin and abdomen in while sitting or standing. If you are sitting, sit in a firm chair with your buttocks against the back of the chair. While sleeping, try replacing your pillow with a small towel rolled to 2 inches in diameter, or use a cervical pillow or soft cervical collar. Poor sleeping positions delay healing.   For patients with nerve root damage, which causes numbness or  weakness, the use of a cervical traction apparatus may be recommended. Surgery is rarely necessary for these injuries. However, cervical strain and sprains that are present at birth (congenital) may require surgery. MEDICATION    If pain medication is necessary, nonsteroidal anti-inflammatory medications, such as aspirin and ibuprofen, or other minor pain relievers, such as acetaminophen, are often recommended.     Do not take pain medication for 7 days before surgery.     Prescription pain relievers may  be given if deemed necessary by your caregiver. Use only as directed and only as much as you need.  HEAT AND COLD:    Cold treatment (icing) relieves pain and reduces inflammation. Cold treatment should be applied for 10 to 15 minutes every 2 to 3 hours for inflammation and pain and immediately after any activity that aggravates your symptoms. Use ice packs or an ice massage.     Heat treatment may be used prior to performing the stretching and strengthening activities prescribed by your caregiver, physical therapist, or athletic trainer. Use a heat pack or a warm soak.  SEEK MEDICAL CARE IF:    Symptoms get worse or do not improve in 2 weeks despite treatment.     New, unexplained symptoms develop (drugs used in treatment may produce side effects).  EXERCISES RANGE OF MOTION (ROM) AND STRETCHING EXERCISES - Cervical Strain and Sprain These exercises may help you when beginning to rehabilitate your injury. In order to successfully resolve your symptoms, you must improve your posture. These exercises are designed to help reduce the forward-head and rounded-shoulder posture which contributes to this condition. Your symptoms may resolve with or without further involvement from your physician, physical therapist or athletic trainer. While completing these exercises, remember:    Restoring tissue flexibility helps normal motion to return to the joints. This allows healthier, less painful movement and activity.     An effective stretch should be held for at least 20 seconds, although you may need to begin with shorter hold times for comfort.     A stretch should never be painful. You should only feel a gentle lengthening or release in the stretched tissue.  STRETCH- Axial Extensors  Lie on your back on the floor. You may bend your knees for comfort. Place a rolled up hand towel or dish towel, about 2 inches in diameter, under the part of your head that makes contact with the floor.     Gently tuck  your chin, as if trying to make a "double chin," until you feel a gentle stretch at the base of your head.     Hold __________ seconds.  Repeat __________ times. Complete this exercise __________ times per day.   STRETECH - Axial Extension   Stand or sit on a firm surface. Assume a good posture: chest up, shoulders drawn back, abdominal muscles slightly tense, knees unlocked (if standing) and feet hip width apart.     Slowly retract your chin so your head slides back and your chin slightly lowers.Continue to look straight ahead.     You should feel a gentle stretch in the back of your head. Be certain not to feel an aggressive stretch since this can cause headaches later.     Hold for __________ seconds.  Repeat __________ times. Complete this exercise __________ times per day. STRETCH - Cervical Side Bend   Stand or sit on a firm surface. Assume a good posture: chest up, shoulders drawn back, abdominal muscles slightly tense, knees unlocked (if  standing) and feet hip width apart.     Without letting your nose or shoulders move, slowly tip your right / left ear to your shoulder until your feel a gentle stretch in the muscles on the opposite side of your neck.     Hold __________ seconds.  Repeat __________ times. Complete this exercise __________ times per day. STRETCH - Cervical Rotators   Stand or sit on a firm surface. Assume a good posture: chest up, shoulders drawn back, abdominal muscles slightly tense, knees unlocked (if standing) and feet hip width apart.     Keeping your eyes level with the ground, slowly turn your head until you feel a gentle stretch along the back and opposite side of your neck.     Hold __________ seconds.  Repeat __________ times. Complete this exercise __________ times per day. RANGE OF MOTION - Neck Circles   Stand or sit on a firm surface. Assume a good posture: chest up, shoulders drawn back, abdominal muscles slightly tense, knees unlocked (if  standing) and feet hip width apart.     Gently roll your head down and around from the back of one shoulder to the back of the other. The motion should never be forced or painful.     Repeat the motion 10-20 times, or until you feel the neck muscles relax and loosen.  Repeat __________ times. Complete the exercise __________ times per day. STRENGTHENING EXERCISES - Cervical Strain and Sprain These exercises may help you when beginning to rehabilitate your injury. They may resolve your symptoms with or without further involvement from your physician, physical therapist or athletic trainer. While completing these exercises, remember:    Muscles can gain both the endurance and the strength needed for everyday activities through controlled exercises.     Complete these exercises as instructed by your physician, physical therapist or athletic trainer. Progress the resistance and repetitions only as guided.     You may experience muscle soreness or fatigue, but the pain or discomfort you are trying to eliminate should never worsen during these exercises. If this pain does worsen, stop and make certain you are following the directions exactly. If the pain is still present after adjustments, discontinue the exercise until you can discuss the trouble with your clinician.  STRENGTH - Cervical Flexors, Isometric  Face a [place], standing about 6 inches away. Place a small pillow, a ball about 6-8 inches in diameter, or a folded towel between your forehead and the [place].     Slightly tuck your chin and gently push your forehead into the soft object. Push only with mild to moderate intensity, building up tension gradually. Keep your jaw and forehead relaxed.     Hold 10 to 20 seconds. Keep your breathing relaxed.     Release the tension slowly. Relax your neck muscles completely before you start the next repetition.  Repeat __________ times. Complete this exercise __________ times per day. STRENGTH- Cervical  Lateral Flexors, Isometric   Stand about 6 inches away from a Giorgio. Place a small pillow, a ball about 6-8 inches in diameter, or a folded towel between the side of your head and the [place].     Slightly tuck your chin and gently tilt your head into the soft object. Push only with mild to moderate intensity, building up tension gradually. Keep your jaw and forehead relaxed.     Hold 10 to 20 seconds. Keep your breathing relaxed.     Release the tension slowly. Relax your  neck muscles completely before you start the next repetition.  Repeat __________ times. Complete this exercise __________ times per day. STRENGTH - Cervical Extensors, Isometric   Stand about 6 inches away from a Sorter. Place a small pillow, a ball about 6-8 inches in diameter, or a folded towel between the back of your head and the [place].     Slightly tuck your chin and gently tilt your head back into the soft object. Push only with mild to moderate intensity, building up tension gradually. Keep your jaw and forehead relaxed.     Hold 10 to 20 seconds. Keep your breathing relaxed.     Release the tension slowly. Relax your neck muscles completely before you start the next repetition.  Repeat __________ times. Complete this exercise __________ times per day. POSTURE AND BODY MECHANICS CONSIDERATIONS - Cervical Strain and Sprain Keeping correct posture when sitting, standing or completing your activities will reduce the stress put on different body tissues, allowing injured tissues a chance to heal and limiting painful experiences. The following are general guidelines for improved posture. Your physician or physical therapist will provide you with any instructions specific to your needs. While reading these guidelines, remember:  The exercises prescribed by your provider will help you have the flexibility and strength to maintain correct postures.     The correct posture provides the optimal environment for your joints to work.  All of your joints have less wear and tear when properly supported by a spine with good posture. This means you will experience a healthier, less painful body.     Correct posture must be practiced with all of your activities, especially prolonged sitting and standing. Correct posture is as important when doing repetitive low-stress activities (typing) as it is when doing a single heavy-load activity (lifting).  PROLONGED STANDING WHILE SLIGHTLY LEANING FORWARD When completing a task that requires you to lean forward while standing in one place for a long time, place either foot up on a stationary 2-4 inch high object to help maintain the best posture. When both feet are on the ground, the low back tends to lose its slight inward curve. If this curve flattens (or becomes too large), then the back and your other joints will experience too much stress, fatigue more quickly and can cause pain.   RESTING POSITIONS Consider which positions are most painful for you when choosing a resting position. If you have pain with flexion-based activities (sitting, bending, stooping, squatting), choose a position that allows you to rest in a less flexed posture. You would want to avoid curling into a fetal position on your side. If your pain worsens with extension-based activities (prolonged standing, working overhead), avoid resting in an extended position such as sleeping on your stomach. Most people will find more comfort when they rest with their spine in a more neutral position, neither too rounded nor too arched. Lying on a non-sagging bed on your side with a pillow between your knees, or on your back with a pillow under your knees will often provide some relief. Keep in mind, being in any one position for a prolonged period of time, no matter how correct your posture, can still lead to stiffness. WALKING Walk with an upright posture. Your ears, shoulders and hips should all line-up. OFFICE WORK When working at a  desk, create an environment that supports good, upright posture. Without extra support, muscles fatigue and lead to excessive strain on joints and other tissues. CHAIR:  A chair should  be able to slide under your desk when your back makes contact with the back of the chair. This allows you to work closely.     The chair's height should allow your eyes to be level with the upper part of your monitor and your hands to be slightly lower than your elbows.     Body position:     Your feet should make contact with the floor. If this is not possible, use a foot rest.     Keep your ears over your shoulders. This will reduce stress on your neck and low back.  Document Released: 01/30/2005 Document Revised: 10/12/2010 Document Reviewed: 05/14/2008 [facility] Patient Information 2012 [place], [place].

## 2011-02-01 ENCOUNTER — Encounter: Payer: Self-pay | Admitting: Family Medicine

## 2011-02-01 DIAGNOSIS — M7710 Lateral epicondylitis, unspecified elbow: Secondary | ICD-10-CM

## 2011-02-01 HISTORY — DX: Lateral epicondylitis, unspecified elbow: M77.10

## 2011-02-01 NOTE — Assessment & Plan Note (Signed)
Patient to wear tennis elbow brace. Motrin as needed for pain. Patient given exercises/handout for low epicondylitis. Patient to return in 2-3 weeks for recheck.

## 2011-02-08 ENCOUNTER — Ambulatory Visit (INDEPENDENT_AMBULATORY_CARE_PROVIDER_SITE_OTHER): Payer: Self-pay | Admitting: Family Medicine

## 2011-02-08 ENCOUNTER — Encounter: Payer: Self-pay | Admitting: Family Medicine

## 2011-02-08 DIAGNOSIS — G5710 Meralgia paresthetica, unspecified lower limb: Secondary | ICD-10-CM | POA: Insufficient documentation

## 2011-02-08 DIAGNOSIS — M5412 Radiculopathy, cervical region: Secondary | ICD-10-CM

## 2011-02-08 DIAGNOSIS — I10 Essential (primary) hypertension: Secondary | ICD-10-CM

## 2011-02-08 NOTE — Patient Instructions (Addendum)
It was great to see you! I'm glad you had a nice Christmas. I'm glad that your arm is getting better.  Keep taking the gabapentin and we will continue to watch it. Your blood pressure looks good today.  Try to watch your salt intake now that the holidays are ending! Come back and see me in 2-3 months, sooner if needed.

## 2011-02-08 NOTE — Assessment & Plan Note (Signed)
Continues to be well-controlled. Continue current regimen. Followup 2-3 months for blood pressure recheck.

## 2011-02-08 NOTE — Assessment & Plan Note (Signed)
Likely secondary to patient's obesity and pannus. Patient already on gabapentin for C6 radiculopathy. Symptoms resolve entirely with sitting. Advised patient on weight loss.

## 2011-02-08 NOTE — Progress Notes (Signed)
S: Pt comes in today for follow up.  HYPERTENSION BP: 122/77 Meds: HCTZ 25, lisinopril 20, Lasix 20 when necessary Taking meds: Yes : Takes Lasix when necessary    # of doses missed/week: 0 Symptoms: Headache: No Dizziness: No Vision changes: No SOB:  No Chest pain: No LE swelling: Yes  Tobacco use: No Diet: Usually follows a low-sodium diet but has not been because of the holidays   CERVICAL RADICULOPATHY Has improved since starting gabapentin. Has not needed any tramadol in the past one and a half weeks. States that the pain is much better it is only having some numbness over the pad of her left thumb. Sometimes has some discomfort when shrugging her left shoulder. Has not noticed any weakness but does feel like her shoulder has a heaviness to it. She's been doing neck exercises. The gabapentin has been making it so that she can sleep better. No new issues or problems. Overall, it is improved since her last visit with Dr. Denyse Amass.  LEG NUMBNESS Has a patch over her right anterior thigh which gets a hot numb sensation. She states that it is worse when she has been standing for long period of time or walking for long period of time. Sitting alleviates it. She has not been having any back or leg pain. She has not noticed any weakness. She's not had any bowel or bladder incontinence.  ROS: Per HPI  History  Smoking status  . Former Smoker  . Types: Cigarettes  . Quit date: 02/14/1995  Smokeless tobacco  . Never Used    O:  Filed Vitals:   02/08/11 1340  BP: 127/77  Pulse: 66    Gen: NAD, overweight CV: RRR, no murmur Pulm: CTA bilat, no wheezes or crackles Abd: soft, NT, obese Ext: Warm, no chronic skin changes, 1+ BLE pitting edema, 5/5 strength arm, shoulder, wrist hand bilaterally; 5/5 strength hip, thigh, calf, foot bilaterally; sensation intact bilateral upper and lower ext    A/P: 45 y.o. female p/w HTN, obesity, cervical radiculopathy and lateral fem nerve  impingement -See problem list -f/u in 2 months

## 2011-02-08 NOTE — Assessment & Plan Note (Signed)
Symptoms improving with gabapentin at night. Has not needed tramadol in the past one and a half weeks. Uses a heating pad if pain becomes worse, which is helping. Continues to neck exercises. Counseled on natural course of disease. Aware that she will likely still have pain for the next few weeks. No red flags this time, however, given the patient now also has cutaneous femoral nerve symptoms, will be careful to follow any new neurological symptoms. While is unlikely to, cannot necessarily rule out a mass if she continues to have various neuropathies.

## 2011-03-24 ENCOUNTER — Other Ambulatory Visit: Payer: Self-pay | Admitting: Family Medicine

## 2011-03-24 NOTE — Telephone Encounter (Signed)
Refill request

## 2011-04-21 ENCOUNTER — Ambulatory Visit (INDEPENDENT_AMBULATORY_CARE_PROVIDER_SITE_OTHER): Payer: Self-pay | Admitting: Family Medicine

## 2011-04-21 ENCOUNTER — Encounter: Payer: Self-pay | Admitting: Family Medicine

## 2011-04-21 DIAGNOSIS — N816 Rectocele: Secondary | ICD-10-CM

## 2011-04-21 DIAGNOSIS — G4730 Sleep apnea, unspecified: Secondary | ICD-10-CM

## 2011-04-21 DIAGNOSIS — R194 Change in bowel habit: Secondary | ICD-10-CM | POA: Insufficient documentation

## 2011-04-21 DIAGNOSIS — I10 Essential (primary) hypertension: Secondary | ICD-10-CM

## 2011-04-21 DIAGNOSIS — R198 Other specified symptoms and signs involving the digestive system and abdomen: Secondary | ICD-10-CM

## 2011-04-21 MED ORDER — SENNA-DOCUSATE SODIUM 8.6-50 MG PO TABS: 1.0000 | ORAL_TABLET | Freq: Every day | ORAL | Status: AC

## 2011-04-21 NOTE — Patient Instructions (Addendum)
It was great to see you! Start taking a stool softener like Sennakot (sennosides-docusate sodium) every day to see if this helps with your bowels. Great work with the weight loss!  Keep up the amazing job you are doing!   Your blood pressure was actually a little low today.  I am going to have you STOP taking your lisinopril.  CONTINUE your HCTZ, for now.   Come back in 1 month to make sure you blood pressure is still well controlled.

## 2011-04-21 NOTE — Assessment & Plan Note (Signed)
Some BRB on toilet paper, likely related to mild constipation and straining as well as rectocele.  Pt reports having a "spot" that GI wanted to "watch" 15-20 years ago on sigmoidoscopy.  Pt will try to get me records of what this was.  Will put in referral to GI for colonoscopy and start pt on stool softener to see if this helps with bowel habits.

## 2011-04-21 NOTE — Assessment & Plan Note (Signed)
May be contributing to bowel habit issues.  Continue with weight loss for now.

## 2011-04-21 NOTE — Assessment & Plan Note (Signed)
Patient recently started going to the gym and continues to avoid sweetened beverages.  Lost 7# in 2.5 months.  Encouraged pt to continue.  Wt Readings from Last 3 Encounters:  04/21/11 352 lb (159.666 kg)  02/08/11 359 lb 9.6 oz (163.113 kg)  01/25/11 355 lb (161.027 kg)

## 2011-04-21 NOTE — Assessment & Plan Note (Addendum)
BP low today.  Will stop lisinopril and continue HCTZ.  No s/s of hypotension.  Patient with 7# weight loss and newly started exercise and diet, which may be helping to control BP.  Will re-evaluate in 1 month to determine if she needs lisinopril added back or if we can d/c HCTZ.    BP Readings from Last 3 Encounters:  04/21/11 89/64  02/08/11 127/77  01/25/11 137/84

## 2011-04-21 NOTE — Assessment & Plan Note (Signed)
Feeling much more rested now with cpap, tolerating well at home.

## 2011-04-21 NOTE — Progress Notes (Signed)
S: Pt comes in today for follow up.  HYPERTENSION BP: 89/64 Meds: lasix 20 PRN, lisinopril 20, HCTZ 25 Taking meds: Yes     # of doses missed/week: 0 Symptoms: Headache: No Dizziness: No  Vision changes: No SOB:  No Chest pain: No LE swelling: No Tobacco use: No   OBESITY Patient is obese.  Diet: has been hit or miss; trying to plan meals and food shop accordingly; doing unsweet tea or water instead of sugar-containing drinks; trying to increase fruit/veggies  Exercise: just joined a gym last month; walks to gym which takes ~20 min each way, doing machines at gym x1 hour.  Going 5-7 days per week. Related conditions: HTN, OSA Having some joint soreness/pain, but working through it.    Wt Readings from Last 3 Encounters:  04/21/11 352 lb (159.666 kg)  02/08/11 359 lb 9.6 oz (163.113 kg)  01/25/11 355 lb (161.027 kg)     SLEEP APNEA Tolerating cpap machine well at night.  Feels like she's sleeping better, energy level is much better.  No AM headaches.   BOWEL HABITS Having some issues with constipation, taking miralax (1 capful every 2 weeks or so).  Also having some blood on toilet paper, but does have h/o hemorrhoids.  Has never had colonoscopy, but did have sigmoidoscopy a long time ago showing ?polyp vs diverticulitis, ~15-20 years ago.  Dad's brother with h/o colon cancer at age 68's.  No BRB in toilet blood, no melena.  Occasional thin, pencil-thin stools.  Does have bowel prolapse.   Noticed these changes in the last 6 months.  Has BM daily even when needing to strain/constipated.     ROS: Per HPI  History  Smoking status  . Former Smoker  . Types: Cigarettes  . Quit date: 02/14/1995  Smokeless tobacco  . Never Used    O:  Filed Vitals:   04/21/11 1548  BP: 89/64  Pulse: 61  Temp: 98.4 F (36.9 C)    Gen: NAD, obese  CV: RRR, no murmur Pulm: CTA bilat, no wheezes or crackles Abd: soft, NT, +BS, no masses appreciate but obese  Ext: Warm, no chronic skin  changes, no edema   A/P: 46 y.o. female p/w HTN, obesity, OSA, bowel changes -See problem list -f/u in 1 month for BP recheck

## 2011-04-24 ENCOUNTER — Telehealth: Payer: Self-pay | Admitting: *Deleted

## 2011-04-24 NOTE — Telephone Encounter (Signed)
Called patient to notify her the referral to GI was denied through project access and we will be referring her to [facility]. Patient was notified that they do not accept the orange card but they will put her on a payment plan and she agreed with this.Allesha Aronoff, Rodena Medin

## 2011-05-30 ENCOUNTER — Ambulatory Visit (INDEPENDENT_AMBULATORY_CARE_PROVIDER_SITE_OTHER): Payer: Self-pay | Admitting: Family Medicine

## 2011-05-30 ENCOUNTER — Encounter: Payer: Self-pay | Admitting: Family Medicine

## 2011-05-30 VITALS — BP 104/72 | HR 65 | Temp 97.5°F | Ht 68.0 in | Wt 355.0 lb

## 2011-05-30 DIAGNOSIS — R198 Other specified symptoms and signs involving the digestive system and abdomen: Secondary | ICD-10-CM

## 2011-05-30 DIAGNOSIS — R194 Change in bowel habit: Secondary | ICD-10-CM

## 2011-05-30 DIAGNOSIS — Z23 Encounter for immunization: Secondary | ICD-10-CM

## 2011-05-30 DIAGNOSIS — I10 Essential (primary) hypertension: Secondary | ICD-10-CM

## 2011-05-30 DIAGNOSIS — Z0000 Encounter for general adult medical examination without abnormal findings: Secondary | ICD-10-CM

## 2011-05-30 NOTE — Patient Instructions (Signed)
It was great to see you! Let's try decreasing your lisinopril to 1/2 tablet once per day and see how you do.  Continue taking the HCTZ.  Keep me informed with what is going on at [facility] with your colonoscopy/GI stuff.  Come back in 1-3 months, sooner if you start having palpitations again.  We will recheck your BP at that time.

## 2011-05-30 NOTE — Progress Notes (Signed)
S: Pt comes in today for follow up.  HYPERTENSION BP: 104/72 Meds: lisinopril, HCTZ Taking meds: Yes : had d/c'ed lisinopril at last visit b/c hypotensive (SBP 89), but pt restarted b/c she was feeling badly     # of doses missed/week: 0 Symptoms: Headache: No Dizziness: No Vision changes: No SOB:  No Chest pain: No LE swelling: No Tobacco use: No   OBESITY Patient is obese.  Diet: lower fat, staying away from sweet drinks, not following a diet as well as she knows she should Exercise: tries to walk 6 days/week for 30 minutes, but has only been doing 1x/week b/c she has not felt like exercising; knows she needs to be better about it. Related conditions: HTN  Wt Readings from Last 3 Encounters:  05/30/11 355 lb (161.027 kg)  04/21/11 352 lb (159.666 kg)  02/08/11 359 lb 9.6 oz (163.113 kg)    BOWEL HABITS Was evaluated by [facility] since project access denied her colonoscopy here in [place].  However has been told it would be [phone] and she is waiting to have it done until she knows a price.  Not needing senna.     ROS: Per HPI  History  Smoking status  . Former Smoker  . Types: Cigarettes  . Quit date: 02/14/1995  Smokeless tobacco  . Never Used    O:  Filed Vitals:   05/30/11 1503  BP: 104/72  Pulse: 65  Temp: 97.5 F (36.4 C)    Gen: NAD, obese CV: RRR, no murmur Pulm: CTA bilat, no wheezes or crackles Ext: Warm, no chronic skin changes, no edema   A/P: 46 y.o. female p/w BP med mgmt  -See problem list -f/u in 1 month

## 2011-05-30 NOTE — Assessment & Plan Note (Signed)
Not dieting and exercising as well as before, and has gained some weight because of this.  Encouraged pt to get back to gym and dieting.  Wt Readings from Last 3 Encounters:  05/30/11 355 lb (161.027 kg)  04/21/11 352 lb (159.666 kg)  02/08/11 359 lb 9.6 oz (163.113 kg)

## 2011-05-30 NOTE — Assessment & Plan Note (Signed)
Tdap given today per pt request.

## 2011-05-30 NOTE — Assessment & Plan Note (Signed)
Referred to [facility]-- pt signed release of info today for them to have access to our labs/records.  She will keep me informed with what she has done there.

## 2011-05-30 NOTE — Assessment & Plan Note (Signed)
Restarted lisinopril b/c of palpitations and feeling run down.  Will decreased to 1/2 dose (10mg ) and monitor.  Likely could d/c all together as symptoms are not likely to actually be related to medication.   BP Readings from Last 3 Encounters:  05/30/11 104/72  04/21/11 89/64  02/08/11 127/77

## 2011-07-31 ENCOUNTER — Other Ambulatory Visit: Payer: Self-pay | Admitting: *Deleted

## 2011-07-31 MED ORDER — HYDROCHLOROTHIAZIDE 25 MG PO TABS: 25.0000 mg | ORAL_TABLET | Freq: Every day | ORAL | Status: DC

## 2011-09-11 ENCOUNTER — Ambulatory Visit (INDEPENDENT_AMBULATORY_CARE_PROVIDER_SITE_OTHER): Payer: Self-pay | Admitting: Obstetrics & Gynecology

## 2011-09-11 ENCOUNTER — Encounter: Payer: Self-pay | Admitting: Obstetrics & Gynecology

## 2011-09-11 VITALS — BP 117/76 | HR 79 | Temp 97.7°F | Resp 12 | Ht 68.0 in | Wt 365.4 lb

## 2011-09-11 DIAGNOSIS — N8111 Cystocele, midline: Secondary | ICD-10-CM

## 2011-09-11 DIAGNOSIS — IMO0002 Reserved for concepts with insufficient information to code with codable children: Secondary | ICD-10-CM

## 2011-09-11 DIAGNOSIS — N3941 Urge incontinence: Secondary | ICD-10-CM

## 2011-09-11 LAB — POCT URINALYSIS DIP (DEVICE)
Glucose, UA: NEGATIVE mg/dL
Hgb urine dipstick: NEGATIVE
Nitrite: NEGATIVE
Protein, ur: NEGATIVE mg/dL
Specific Gravity, Urine: 1.02 (ref 1.005–1.030)
Urobilinogen, UA: 0.2 mg/dL (ref 0.0–1.0)

## 2011-09-11 MED ORDER — ESTRADIOL 1 MG PO TABS: 2.0000 mg | ORAL_TABLET | Freq: Every day | ORAL | Status: DC

## 2011-09-11 MED ORDER — OXYBUTYNIN CHLORIDE 5 MG PO TABS: 5.0000 mg | ORAL_TABLET | Freq: Two times a day (BID) | ORAL | Status: DC

## 2011-09-11 NOTE — Progress Notes (Signed)
Patient ID: [name], female   DOB: Oct 16, 1965, 46 y.o.   MRN: [id number]  Chief Complaint  Patient presents with  . Follow-up    HPI [name] is a 46 y.o. female.  Patient had a TAH BSO May 2012. She is on estrogen therapy and still has vasomotor symptoms. She would like to see if her therapy can be adjusted. HPI  Past Medical History  Diagnosis Date  . Hypertension   . Cystocele   . Rectocele   . Dysfunctional uterine bleeding   . Anemia     Hgb 5 02/18/10, blood transfusion 02/18/10  . Depression   . PCOS (polycystic ovarian syndrome)   . Obesity, Class III, BMI 40-49.9 (morbid obesity)   . Cervical radiculopathy at C6 01/25/2011  . Lateral epicondylitis  of elbow 02/01/2011    Past Surgical History  Procedure Date  . Cholecystectomy 1993  . Vaginal delivery 1993  . Abdominal hysterectomy   Jun 30, 2010  Family History  Problem Relation Age of Onset  . Colon cancer Maternal Uncle   . Hyperlipidemia Mother   . Hypertension Mother   . Hyperlipidemia Father   . Hypertension Father   . Heart attack Father 62    5-bypass surgery  . Heart disease Father   . Diabetes Maternal Aunt   . Cancer Paternal Uncle     colon    Social History History  Substance Use Topics  . Smoking status: Former Smoker    Types: Cigarettes    Quit date: 02/14/1995  . Smokeless tobacco: Never Used  . Alcohol Use: No    No Known Allergies  Current Outpatient Prescriptions  Medication Sig Dispense Refill  . estradiol (ESTRACE) 1 MG tablet Take 2 tablets (2 mg total) by mouth daily.  30 tablet  12  . hydrochlorothiazide (HYDRODIURIL) 25 MG tablet Take 1 tablet (25 mg total) by mouth daily.  30 tablet  3  . lisinopril (PRINIVIL,ZESTRIL) 20 MG tablet Take 10 mg by mouth daily.      Marland Kitchen oxybutynin (DITROPAN) 5 MG tablet Take 1 tablet (5 mg total) by mouth 2 (two) times daily.  60 tablet  12  . DISCONTD: estradiol (ESTRACE) 1 MG tablet Take 1 tablet (1 mg total) by mouth daily.  30  tablet  12  . DISCONTD: lisinopril (PRINIVIL,ZESTRIL) 20 MG tablet Take 1 tablet (20 mg total) by mouth daily.  90 tablet  1  . DISCONTD: oxybutynin (DITROPAN) 5 MG tablet Take 1 tablet (5 mg total) by mouth 2 (two) times daily.  60 tablet  12  . furosemide (LASIX) 20 MG tablet Take 1 tablet (20 mg total) by mouth daily.  90 tablet  1  . gabapentin (NEURONTIN) 300 MG capsule Take 1 capsule (300 mg total) by mouth at bedtime.  30 capsule  1  . omeprazole (PRILOSEC) 20 MG capsule Take 1 capsule (20 mg total) by mouth daily.  30 capsule  0  . sennosides-docusate sodium (SENOKOT-S) 8.6-50 MG tablet Take 1 tablet by mouth daily.  30 tablet  11  . traMADol (ULTRAM) 50 MG tablet Take 1 tablet (50 mg total) by mouth every 6 (six) hours as needed for pain. Maximum dose= 8 tablets per day  60 tablet  1    Review of Systems Review of Systems  Constitutional: Negative for fever, fatigue and unexpected weight change.  Gastrointestinal: Positive for abdominal pain (cramping), diarrhea, constipation, blood in stool (small amount when she wipes) and abdominal distention.  Occasional bloating, acid reflux  Genitourinary: Positive for urgency (symptoms controlled with oxybutynin). Negative for vaginal bleeding, vaginal discharge and difficulty urinating.    Blood pressure 117/76, pulse 79, temperature 97.7 F (36.5 C), temperature source Oral, resp. rate 12, height 5\' 8"  (1.727 m), weight 365 lb 6.4 oz (165.744 kg), last menstrual period 03/21/2010.  Physical Exam Physical Exam  Constitutional: She appears well-developed. No distress.       Morbid obesity  Abdominal: Soft. She exhibits no distension and no mass. There is no tenderness.       Obese, well healed vertical abdominal incision  Genitourinary: No vaginal discharge found.       Good estrogen effect, vaginal well supported, no masses or tenderness. External hemorrhoid  Skin: Skin is warm and dry.  Psychiatric: She has a normal mood and  affect.    Data Reviewed    Assessment    Patient has a number of GI symptoms consistent with reflux and irritable bowel. She has external hemorrhoid but she also has seen blood in her stool. History of uncle with bowel cancer. Make a referral to GI and she should have a screening colonoscopy. She has vasomotor symptoms are not controlled on her current estrogen therapy. Her urinary symptoms are controlled with oxybutynin    Plan    I will refill her oxybutynin prescription. To help her vasomotor symptoms I will increase her estrogen too estradiol 2 mg by mouth daily. She will be referred to gastroenterology.  Dr. Scheryl Darter 09/11/2011 4:40 PM       [name] 09/11/2011, 4:32 PM

## 2011-09-11 NOTE — Progress Notes (Signed)
Referral to GI to [facility] per patient preference - appt made for 09/15/11

## 2011-09-11 NOTE — Patient Instructions (Signed)
Colonoscopy A colonoscopy is an exam to evaluate your entire colon. In this exam, your colon is cleansed. A long fiberoptic tube is inserted through your rectum and into your colon. The fiberoptic scope (endoscope) is a long bundle of enclosed and very flexible fibers. These fibers transmit light to the area examined and send images from that area to your caregiver. Discomfort is usually minimal. You may be given a drug to help you sleep (sedative) during or prior to the procedure. This exam helps to detect lumps (tumors), polyps, inflammation, and areas of bleeding. Your caregiver may also take a small piece of tissue (biopsy) that will be examined under a microscope. LET YOUR CAREGIVER KNOW ABOUT:   Allergies to food or medicine.   Medicines taken, including vitamins, herbs, eyedrops, over-the-counter medicines, and creams.   Use of steroids (by mouth or creams).   Previous problems with anesthetics or numbing medicines.   History of bleeding problems or blood clots.   Previous surgery.   Other health problems, including diabetes and kidney problems.   Possibility of pregnancy, if this applies.  BEFORE THE PROCEDURE   A clear liquid diet may be required for 2 days before the exam.   Ask your caregiver about changing or stopping your regular medications.   Liquid injections (enemas) or laxatives may be required.   A large amount of electrolyte solution may be given to you to drink over a short period of time. This solution is used to clean out your colon.   You should be present 60 minutes prior to your procedure or as directed by your caregiver.  AFTER THE PROCEDURE   If you received a sedative or pain relieving medication, you will need to arrange for someone to drive you home.   Occasionally, there is a little blood passed with the first bowel movement. Do not be concerned.  FINDING OUT THE RESULTS OF YOUR TEST Not all test results are available during your visit. If your test  results are not back during the visit, make an appointment with your caregiver to find out the results. Do not assume everything is normal if you have not heard from your caregiver or the medical facility. It is important for you to follow up on all of your test results. HOME CARE INSTRUCTIONS   It is not unusual to pass moderate amounts of gas and experience mild abdominal cramping following the procedure. This is due to air being used to inflate your colon during the exam. Walking or a warm pack on your belly (abdomen) may help.   You may resume all normal meals and activities after sedatives and medicines have worn off.   Only take over-the-counter or prescription medicines for pain, discomfort, or fever as directed by your caregiver. Do not use aspirin or blood thinners if a biopsy was taken. Consult your caregiver for medicine usage if biopsies were taken.  SEEK IMMEDIATE MEDICAL CARE IF:   You have a fever.   You pass large blood clots or fill a toilet with blood following the procedure. This may also occur 10 to 14 days following the procedure. This is more likely if a biopsy was taken.   You develop abdominal pain that keeps getting worse and cannot be relieved with medicine.  Document Released: 01/28/2000 Document Revised: 01/19/2011 Document Reviewed: 09/12/2007 [employer] Patient Information 2012 [employer]. 

## 2011-10-17 ENCOUNTER — Ambulatory Visit (INDEPENDENT_AMBULATORY_CARE_PROVIDER_SITE_OTHER): Payer: Self-pay | Admitting: Family Medicine

## 2011-10-17 ENCOUNTER — Encounter: Payer: Self-pay | Admitting: Family Medicine

## 2011-10-17 VITALS — BP 121/78 | HR 80 | Temp 98.1°F | Ht 68.0 in | Wt 369.0 lb

## 2011-10-17 DIAGNOSIS — M7042 Prepatellar bursitis, left knee: Secondary | ICD-10-CM | POA: Insufficient documentation

## 2011-10-17 DIAGNOSIS — M7040 Prepatellar bursitis, unspecified knee: Secondary | ICD-10-CM

## 2011-10-17 NOTE — Progress Notes (Signed)
S: Pt comes in today for left knee injury.  Had a fall from standing in mid-July; fell on her knees, mostly on her left knee.  Noticed some pain after this event.  Tried some ibuprofen, which seemed to help, the one time she took it.  Did ice it x1 as well.  Did notice some bruising and swelling over her left knee cap.  Still feels like she has some swelling over her left knee.  Tenderness to touch initially, which she feels like has improved.  Trying to walk down the stairs seems to make the pain worse.  Feels more like a twinge/dull pain, not severe, but enough to make her know something isn't right with it.  Isn't needing any pain medications right now.  No locking, popping, or instability.  No other falls (initial fall was from tripping over flip flop rather than knee giving out).    Noticed increased swelling of her LLE 2-3 weeks ago.  Took some lasix, which helped with the swelling.  Swelling seems to be improving at this time.  No redness or tenderness in LLE.    ROS: Per HPI  History  Smoking status  . Former Smoker  . Types: Cigarettes  . Quit date: 02/14/1995  Smokeless tobacco  . Never Used    O:  Filed Vitals:   10/17/11 1538  BP: 121/78  Pulse: 80  Temp: 98.1 F (36.7 C)    Gen: NAD Ext: LEFT KNEE: + fluid over left patella, no joint instability, full ROM w/o pain, + mild TTP over superior aspect of patella and medial joint line RIGHT knee: normal w/o effusion, full ROM, no TTP, no instability    A/P: 46 y.o. female p/w left knee injury (prepatellar bursitis)  -See problem list -f/u in PRN

## 2011-10-17 NOTE — Patient Instructions (Addendum)
It was good to see you today.  We will give your knee 1 more month to see if the swelling goes away on its own.  Use a compression sleeve on that knee to see if that helps.   Try to reapply for the orange card in case we need to work up this swelling in your leg more.  If your knee is still swollen, come back in 4 weeks-- either to see me or down in [facility] to see Dr. Denyse Amass.    Prepatellar Bursitis with Rehab  Bursitis is a condition that is characterized by inflammation of a bursa. Kateri Mc exists in many areas of the body. They are fluid filled sacs that lie between a soft tissue (skin, tendon, or ligament) and a bone, and they reduce friction between the structures as well as the stress placed on the soft tissue. Prepatellar bursitis is inflammation of the bursa that lies between the skin and the kneecap (patella). This condition often causes pain over the patella. SYMPTOMS   Pain, tenderness, and/or inflammation over the patella.   Pain that worsens with movement of the knee joint.   Decreased range of motion for the knee joint.   A crackling sound (crepitation) when the bursa is moved or touched.   Occasionally, painless swelling of the bursa.   Fever (when infected).  CAUSES  Bursitis is caused by damage to the bursa, which results in an inflammatory response. Common mechanisms of injury include:  Direct trauma to the front of the knee.   Repetitive and/ or stressful use of the knee.  RISK INCREASES WITH:  Activities in which kneeling and/or falling on one's knees is likely (volleyball or football).   Repetitive and stressful training, especially if it involves running on hills.   Improper training techniques, such as a sudden increase in the intensity, frequency or duration of training.   Failure to warm-up properly before activity.   Poor technique.   Artificial turf.  PREVENTION   Avoid kneeling or falling on your knees.   Warm up and stretch  properly before activity.   Allow for adequate recovery between workouts.   Maintain physical fitness:   Strength, flexibility, and endurance.   Cardiovascular fitness.   Learn and use proper technique. When possible, a have coach correct improper technique.   Wear properly fitted and padded protective equipment (knee pads).  PROGNOSIS  If treated properly, then the symptoms of prepatellar bursitis usually resolve within 2 weeks. RELATED COMPLICATIONS   Recurrent symptoms that result in a chronic problem.   Prolonged healing time, if improperly treated or re-injured.   Limited range of motion.   Infection of bursa.   Chronic inflammation or scarring of bursa.  TREATMENT  Treatment initially involves the use of ice and medication to help reduce pain and inflammation. The use of strengthening and stretching exercises may help reduce pain with activity, especially those of the quadriceps and hamstring muscles. These exercises may be performed at home or with referral to a therapist. Your caregiver may recommend kneepads when you return to playing sports, in order to reduce the stress on the prepatellar bursa. If symptoms persist despite treatment, then your caregiver may drain fluid out with a needle (aspirate) the bursa. If symptoms persist for greater than 6 months despite non-surgical (conservative) treatment, then surgery may be recommended to remove the bursa.  MEDICATION  If pain medication is necessary, then nonsteroidal anti-inflammatory medications, such as aspirin and ibuprofen, or other minor pain  relievers, such as acetaminophen, are often recommended.   Do not take pain medication for 7 days before surgery.   Prescription pain relievers may be given if deemed necessary by your caregiver. Use only as directed and only as much as you need.   Corticosteroid injections may be given by your caregiver. These injections should be reserved for the most serious cases, because  they may only be given a certain number of times.  HEAT AND COLD  Cold treatment (icing) relieves pain and reduces inflammation. Cold treatment should be applied for 10 to 15 minutes every 2 to 3 hours for inflammation and pain and immediately after any activity that aggravates your symptoms. Use ice packs or massage the area with a piece of ice (ice massage).   Heat treatment may be used prior to performing the stretching and strengthening activities prescribed by your caregiver, physical therapist, or athletic trainer. Use a heat pack or soak the injury in warm water.  SEEK MEDICAL CARE IF:  Treatment seems to offer no benefit, or the condition worsens.   Any medications produce adverse side effects.  EXERCISES RANGE OF MOTION (ROM) AND STRETCHING EXERCISES - Prepatellar Bursitis These exercises may help you when beginning to rehabilitate your injury. Your symptoms may resolve with or without further involvement from your physician, physical therapist or athletic trainer. While completing these exercises, remember:   Restoring tissue flexibility helps normal motion to return to the joints. This allows healthier, less painful movement and activity.   An effective stretch should be held for at least 30 seconds.   A stretch should never be painful. You should only feel a gentle lengthening or release in the stretched tissue.  STRETCH - Hamstrings, Standing  Stand or sit and extend your right / left leg, placing your foot on a chair or foot stool   Keeping a slight arch in your low back and your hips straight forward.   Lead with your chest and lean forward at the waist until you feel a gentle stretch in the back of your right / left knee or thigh. (When done correctly, this exercise requires leaning only a small distance.)   Hold this position for __________ seconds.  Repeat __________ times. Complete this stretch __________ times per day. STRETCH - Quadriceps, Prone   Lie on your  stomach on a firm surface, such as a bed or padded floor.   Bend your right / left knee and grasp your ankle. If you are unable to reach, your ankle or pant leg, use a belt around your foot to lengthen your reach.   Gently pull your heel toward your buttocks. Your knee should not slide out to the side. You should feel a stretch in the front of your thigh and/or knee.   Hold this position for __________ seconds.  Repeat __________ times. Complete this stretch __________ times per day.  STRETCH - Hamstrings/Adductors, V-Sit   Sit on the floor with your legs extended in a large "V," keeping your knees straight.   With your head and chest upright, bend at your waist reaching for your right foot to stretch your left adductors.   You should feel a stretch in your left inner thigh. Hold for __________ seconds.   Return to the upright position to relax your leg muscles.   Continuing to keep your chest upright, bend straight forward at your waist to stretch your hamstrings.   You should feel a stretch behind both of your thighs and/or knees. Hold  for __________ seconds.   Return to the upright position to relax your leg muscles.   Repeat steps 2 through 4.  Repeat __________ times. Complete this exercise __________ times per day.  STRENGTHENING EXERCISES - Prepatellar Bursitis  These exercises may help you when beginning to rehabilitate your injury. They may resolve your symptoms with or without further involvement from your physician, physical therapist or athletic trainer. While completing these exercises, remember:   Muscles can gain both the endurance and the strength needed for everyday activities through controlled exercises.   Complete these exercises as instructed by your physician, physical therapist or athletic trainer. Progress the resistance and repetitions only as guided.  STRENGTH - Quadriceps, Isometrics  Lie on your back with your right / left leg extended and your opposite  knee bent.   Gradually tense the muscles in the front of your right / left thigh. You should see either your kneecap slide up toward your hip or increased dimpling just above the knee. This motion will push the back of the knee down toward the floor/mat/bed on which you are lying.   Hold the muscle as tight as you can without increasing your pain for __________ seconds.   Relax the muscles slowly and completely in between each repetition.  Repeat __________ times. Complete this exercise __________ times per day.  STRENGTH - Quadriceps, Short Arcs   Lie on your back. Place a __________ inch towel roll under your knee so that the knee slightly bends.   Raise only your lower leg by tightening the muscles in the front of your thigh. Do not allow your thigh to rise.   Hold this position for __________ seconds.  Repeat __________ times. Complete this exercise __________ times per day.  OPTIONAL ANKLE WEIGHTS: Begin with ____________________, but DO NOT exceed ____________________. Increase in1 lb/0.5 kg increments.  STRENGTH - Quadriceps, Straight Leg Raises  Quality counts! Watch for signs that the quadriceps muscle is working to insure you are strengthening the correct muscles and not "cheating" by substituting with healthier muscles.  Lay on your back with your right / left leg extended and your opposite knee bent.   Tense the muscles in the front of your right / left thigh. You should see either your kneecap slide up or increased dimpling just above the knee. Your thigh may even quiver.   Tighten these muscles even more and raise your leg 4 to 6 inches off the floor. Hold for __________ seconds.   Keeping these muscles tense, lower your leg.   Relax the muscles slowly and completely in between each repetition.  Repeat __________ times. Complete this exercise __________ times per day.  STRENGTH - Quadriceps, Step-Ups   Use a thick book, step or step stool that is __________ inches tall.     Holding a Ostenson or counter for balance only, not support.   Slowly step-up with your right / left foot, keeping your knee in line with your hip and foot. Do not allow your knee to bend so far that you cannot see your toes.   Slowly unlock your knee and lower yourself to the starting position. Your muscles, not gravity, should lower you.  Repeat __________ times. Complete this exercise __________ times per day. Document Released: 01/30/2005 Document Revised: 01/19/2011 Document Reviewed: 05/14/2008 [facility] Patient Information 2012 [place], [place].

## 2011-10-17 NOTE — Assessment & Plan Note (Signed)
Compression, elevation.  Will recheck in 1 month to see if needs to be drained. Red flags for sooner return discussed.

## 2011-10-30 ENCOUNTER — Other Ambulatory Visit: Payer: Self-pay | Admitting: Obstetrics & Gynecology

## 2011-10-30 DIAGNOSIS — Z1231 Encounter for screening mammogram for malignant neoplasm of breast: Secondary | ICD-10-CM

## 2011-11-05 ENCOUNTER — Encounter: Payer: Self-pay | Admitting: Family Medicine

## 2011-11-25 ENCOUNTER — Other Ambulatory Visit: Payer: Self-pay | Admitting: Family Medicine

## 2011-11-29 ENCOUNTER — Ambulatory Visit (HOSPITAL_COMMUNITY)
Admission: RE | Admit: 2011-11-29 | Discharge: 2011-11-29 | Disposition: A | Discharge status: Home / Self Care | Payer: Self-pay | Source: Ambulatory Visit | Attending: Obstetrics & Gynecology | Admitting: Obstetrics & Gynecology

## 2011-11-29 DIAGNOSIS — Z1231 Encounter for screening mammogram for malignant neoplasm of breast: Secondary | ICD-10-CM | POA: Insufficient documentation

## 2012-01-28 ENCOUNTER — Other Ambulatory Visit: Payer: Self-pay | Admitting: Family Medicine

## 2012-01-29 NOTE — Telephone Encounter (Signed)
Needs appt with PCP for further refills

## 2012-02-23 ENCOUNTER — Encounter: Payer: Self-pay | Admitting: Family Medicine

## 2012-02-23 ENCOUNTER — Ambulatory Visit (INDEPENDENT_AMBULATORY_CARE_PROVIDER_SITE_OTHER): Payer: No Typology Code available for payment source | Admitting: Family Medicine

## 2012-02-23 VITALS — BP 128/76 | HR 80 | Ht 68.0 in | Wt 373.0 lb

## 2012-02-23 DIAGNOSIS — D649 Anemia, unspecified: Secondary | ICD-10-CM

## 2012-02-23 DIAGNOSIS — Z23 Encounter for immunization: Secondary | ICD-10-CM

## 2012-02-23 DIAGNOSIS — I10 Essential (primary) hypertension: Secondary | ICD-10-CM

## 2012-02-23 DIAGNOSIS — Z0000 Encounter for general adult medical examination without abnormal findings: Secondary | ICD-10-CM

## 2012-02-23 LAB — COMPREHENSIVE METABOLIC PANEL
ALT: 62 U/L — ABNORMAL HIGH (ref 0–35)
AST: 45 U/L — ABNORMAL HIGH (ref 0–37)
Alkaline Phosphatase: 85 U/L (ref 39–117)
CO2: 30 mEq/L (ref 19–32)
Creat: 0.8 mg/dL (ref 0.50–1.10)
Sodium: 137 mEq/L (ref 135–145)
Total Bilirubin: 0.6 mg/dL (ref 0.3–1.2)
Total Protein: 7.3 g/dL (ref 6.0–8.3)

## 2012-02-23 LAB — CBC
MCH: 31.1 pg (ref 26.0–34.0)
MCHC: 34.7 g/dL (ref 30.0–36.0)
MCV: 89.4 fL (ref 78.0–100.0)
Platelets: 279 10*3/uL (ref 150–400)
RBC: 5.02 MIL/uL (ref 3.87–5.11)
RDW: 13.6 % (ref 11.5–15.5)

## 2012-02-23 LAB — VITAMIN B12: Vitamin B-12: 376 pg/mL (ref 211–911)

## 2012-02-23 NOTE — Patient Instructions (Signed)
It was good to see you today.  We are checking some labs.  I will send you a letter with the results and we can discuss in more detail at your next visit.  I think that you would benefit from meeting with Dr. Gerilyn Pilgrim.  You can schedule an appointment directly with her by calling [phone].  Come back to see me in 1 month.  That way, if you decide to proceed with weight loss surgery, we can start the 6 months of monthly visits now.

## 2012-02-23 NOTE — Assessment & Plan Note (Signed)
Interested in weight loss surgery potentially in the future.  Will start monthly visits and refer to Dr. Gerilyn Pilgrim.  Patient concerned about DM based on elevated CBGs at home on old meter  Wt Readings from Last 3 Encounters:  02/23/12 373 lb (169.192 kg)  10/17/11 369 lb (167.377 kg)  09/11/11 365 lb 6.4 oz (165.744 kg)

## 2012-02-23 NOTE — Progress Notes (Signed)
  Subjective:     [name] is a 47 y.o. female and is here for a comprehensive physical exam. The patient reports problems - see below.  HTN: on lisino/HCTZ 10/25, was decreased because of hypoTN -no CP, SOB, LE edema, dizziness, HAs, vision changes  Concerned about diabetes: feels sluggish; has glucometer at home and checked her CBGs the past week-- was 180 when she first woke up yesterday morning No polyuria or polydipsia but has had excessive hunger  Has PCOS- has previously been on metformin many years ago  Weight: trying to avoid sugary drinks and soda; trying to make the correct choices- good fats (egg, avocado); does have sugar cravings; is not currently exercising Is interested in seeing Dr. Gerilyn Pilgrim again but would like to discuss good food options more in detail.    Is also potentially interested in weight loss surgery   S/p hysterectomy- uterus and cervix removed 06/2010. Does not need Pap smears  History   Social History  . Marital Status: Married    Spouse Name: Quintasia Theroux    Number of Children: 2  . Years of Education: highschool   Occupational History  . 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 Smoking status: Former Smoker    Types: Cigarettes    Quit date: 02/14/1995  . Smokeless tobacco: Never Used  . Alcohol Use: No  . Drug Use: No  . Sexually Active: Yes -- Female partner(s)    Birth Control/ Protection: Other-see comments     Comment: hysterectomy   Other Topics Concern  . Not on file   Social History Narrative  . No narrative on file   Health Maintenance  Topic Date Due  . Influenza Vaccine  10/15/2011  . Tetanus/tdap  05/29/2021    The following portions of the patient's history were reviewed and updated as appropriate: allergies, current medications, past family history, past medical history, past social history, past surgical history and problem list.  Review of Systems Pertinent items are noted in  HPI.   Objective:    BP 128/76  Pulse 80  Ht 5\' 8"  (1.727 m)  Wt 373 lb (169.192 kg)  BMI 56.71 kg/m2  LMP 03/21/2010 General appearance: alert, cooperative, appears stated age, no distress and morbidly obese Eyes: conjunctivae/corneas clear. PERRL, EOM's intact. Fundi benign. Lungs: clear to auscultation bilaterally Heart: regular rate and rhythm, S1, S2 normal, no murmur, click, rub or gallop Abdomen: soft, non-tender; bowel sounds normal; no masses,  no organomegaly Extremities: extremities normal, atraumatic, no cyanosis or edema Pulses: 2+ and symmetric Skin: Skin color, texture, turgor normal. No rashes or lesions Neurologic: Grossly normal    Assessment:    Healthy female exam.      Plan:     See After Visit Summary for Counseling Recommendations

## 2012-02-23 NOTE — Assessment & Plan Note (Addendum)
BP normal today. Currently on lisinopril 10, HCTZ 25.  Continue

## 2012-02-23 NOTE — Assessment & Plan Note (Addendum)
History of 2/2 heavy menstrual bleeding now s/p hysterectomy, but will check since she has been feeling fatigued. Will also check B12 since she has some numb/tingling feeling in feet.

## 2012-02-23 NOTE — Assessment & Plan Note (Addendum)
Due for flu vaccine- given Does not need paps since s/p total hysterectomy 06/2010.

## 2012-03-01 ENCOUNTER — Encounter: Payer: Self-pay | Admitting: Family Medicine

## 2012-03-12 ENCOUNTER — Other Ambulatory Visit: Payer: No Typology Code available for payment source

## 2012-03-12 NOTE — Progress Notes (Signed)
TSH DONE TODAY [name] 

## 2012-03-18 ENCOUNTER — Ambulatory Visit (INDEPENDENT_AMBULATORY_CARE_PROVIDER_SITE_OTHER): Payer: No Typology Code available for payment source | Admitting: Family Medicine

## 2012-03-18 ENCOUNTER — Encounter: Payer: Self-pay | Admitting: Family Medicine

## 2012-03-18 VITALS — Ht 68.5 in | Wt 381.6 lb

## 2012-03-18 DIAGNOSIS — I10 Essential (primary) hypertension: Secondary | ICD-10-CM

## 2012-03-18 NOTE — Progress Notes (Signed)
Medical Nutrition Therapy:  Appt start time: 1330 end time:  1430.  Assessment:  Primary concerns today: Weight management and hypertension.  [name] was previously seen by me in 2012.  She has gained 20 lb since 8/12, and is frustrated that she cannot manage her weight.  She thinks her wt gain is related to lack of exercise, but she also feels hungry much of the time.  [name] has stopped drinking soda, and has been working on cutting down on artificial sweeteners.  Currently, she has been drinking Propel and sometimes using Skinny Girl Monk Fruit sweetener (contains [employer] Extract [mogrosides], Malic Acid, Sodium Benzoate, & Potassium Sorbate).    24-hr recall:  (Up at 11 AM) B ( PM)-  none Snk ( AM)-  none L (1 PM)-  1 ([employer]) chx salad sandw on sandwich thins, Propel drink    Snk ( PM)-  1 dill pickle spear, some olives D (7:30 PM)-  Steak&Shake:  Toss salad w/ 1 1/2 tbsp lite ranch dressing, chili w/ noodles, unsweetened tea  Snk ( PM)-  1 c quinoa salad (w/ blk beans & olive oil) Yesterday was a fairly low-intake day.  [name] usually eats breakfast ~6 X wk, even if it is just 100-kcal yogurt w/ fruit or 1/2 c of cottage cheese.    [name] is not exercising at all since about last summer.  She works ~20-25 hrs/wk from home, and she is taking 18 hrs credit of online business courses at [employer].   [name] has been using her CPAP machine regularly, and she is usually sleeping 7 hrs nightly; bedtime 2 - 4:30 AM.    Progress Towards Goal(s):  In progress.    Nutritional Diagnosis:  NB-2.1 Physical inactivity As related to tmie constraints.  As evidenced by no regular activity. NI-1.5 Excessive energy intake As related to expenditure.  As evidenced by 20-lb weight gain in the past 18 months.   Intervention:  Nutrition education.  Monitoring/Evaluation:  Dietary intake, exercise, and body weight in 3 week(s).

## 2012-03-18 NOTE — Patient Instructions (Addendum)
Dietary Plan for Weight Loss  1. THREE MEALS & 1-2 SNACKS PER DAY.   2. B-  Protein (at least 15 grams), fruit, whole grain  - 2 eggs, 1 slice whole-grain bread, fruit (non-banana) - 1-1  cups yogurt &/or cottage cheese with fruit (berries) & 1-2 tbsp unsalted nuts/seeds and -1 whole-wheat sandwich thins - Lunch or leftover dinner foods  3. Your journal:  Write down exactly what you have for breakfast, how much, and what time.  Then write down what time you first notice hunger.   - Include also:  If you do snack before you actually feel hungry, write down what led to your choice of eating.   - Also write about choices of eating sweets:  Why?  What prompted your choice?  What might you do differently the next time you are in this same situation? - Keep in mind the THREE Qs of a good food decision: i. How hungry am I? ii. What am I in the mood for? iii. What's good for me?  4. Try to reduce artificial sweetener use - slowly & progressively.    5. Come up with your own version of a meal plan.  We will use this as a starting point in developing your final plan.    6. Go get your gym membership, and walk when you can this week:  Record any minutes of exercise you do over the next few weeks.

## 2012-03-20 ENCOUNTER — Ambulatory Visit (INDEPENDENT_AMBULATORY_CARE_PROVIDER_SITE_OTHER): Payer: BC Managed Care – PPO | Admitting: Family Medicine

## 2012-03-20 ENCOUNTER — Encounter: Payer: Self-pay | Admitting: Family Medicine

## 2012-03-20 VITALS — BP 122/68 | HR 64 | Ht 68.5 in | Wt 378.0 lb

## 2012-03-20 DIAGNOSIS — R350 Frequency of micturition: Secondary | ICD-10-CM

## 2012-03-20 DIAGNOSIS — I10 Essential (primary) hypertension: Secondary | ICD-10-CM

## 2012-03-20 LAB — POCT URINALYSIS DIPSTICK
Bilirubin, UA: NEGATIVE
Blood, UA: NEGATIVE
Glucose, UA: NEGATIVE
Ketones, UA: NEGATIVE
Spec Grav, UA: 1.015
Urobilinogen, UA: 0.2

## 2012-03-20 NOTE — Patient Instructions (Addendum)
It was good to see you today.  Your blood pressure looks good, we don't need to make any changes.  Keep up the great work with dieting and seeing Dr. Gerilyn Pilgrim!  I will call if your urine is infected, otherwise assume it did not look like an infection.  We will keep a close eye on this and we can send you to the urologists or GYN doctors if it continues to be an issue for further evaluation.  Come back to see me in a month or so, mostly to see how the urine issue is doing and how your weight loss is going.

## 2012-03-20 NOTE — Assessment & Plan Note (Signed)
Feeling of incomplete emptying and discomfort with urination.  She does have known rectocele and cystocele, s/p hysterectomy.  Will monitor, but could consider referral to Uro now that pt has private insurance for evaluation.  UA today does not show evidence of infection.

## 2012-03-20 NOTE — Assessment & Plan Note (Signed)
Started diet, saw Dr. Gerilyn Pilgrim on 2/3, will continue with her.

## 2012-03-20 NOTE — Progress Notes (Signed)
S: Pt comes in today for follow up.  HYPERTENSION BP: 143/75 --> 122/68 Meds: lisino/HCTZ 10/25 (was previously on lasix 20 PRN, lisinopril 20, HCTZ 25 but meds were decreased due to hypoTN (SBP 89) in 04/2011, normotensive on current regimen 02/2012) Taking meds: Yes     # of doses missed/week: 0 Symptoms: Headache: No Dizziness: No Vision changes: No SOB:  No Chest pain: No LE swelling: No Tobacco use: No   OBESITY Patient is obese.  Had visit with Dr. Gerilyn Pilgrim 03/18/12  Related conditions: HTN, OSA  Wt Readings from Last 3 Encounters:  03/20/12 378 lb (171.46 kg)  03/18/12 381 lb 9.6 oz (173.093 kg)  02/23/12 373 lb (169.192 kg)    BLADDER ISSUE Has not been taking her ditropan because she feels like she is not completely emptying her bladder.  Has been present for the past week, some pain/itching with urination. + urgency.  No OTC medicines.     ROS: Per HPI  History  Smoking status  . Former Smoker  . Types: Cigarettes  . Quit date: 02/14/1995  Smokeless tobacco  . Never Used    O:  Filed Vitals:   03/20/12 1616  BP: 143/75  Pulse: 64    Gen: NAD CV: RRR, no murmur Pulm: CTA bilat, no wheezes or crackles Abd: soft, NT, no CVA tenderness  Ext: Warm, no edema   A/P: 47 y.o. female p/w HTN, obesity, urinary issue -See problem list -f/u in 3-4 weeks

## 2012-03-20 NOTE — Assessment & Plan Note (Signed)
Normotensive on recheck on lisino 10, HCTZ 25, continue this regimen, no need for changes.

## 2012-03-28 ENCOUNTER — Other Ambulatory Visit: Payer: Self-pay | Admitting: Family Medicine

## 2012-04-08 ENCOUNTER — Encounter: Payer: Self-pay | Admitting: Family Medicine

## 2012-04-08 ENCOUNTER — Ambulatory Visit (INDEPENDENT_AMBULATORY_CARE_PROVIDER_SITE_OTHER): Payer: BC Managed Care – PPO | Admitting: Family Medicine

## 2012-04-08 VITALS — Ht 68.5 in | Wt 377.8 lb

## 2012-04-08 DIAGNOSIS — I10 Essential (primary) hypertension: Secondary | ICD-10-CM

## 2012-04-08 NOTE — Patient Instructions (Addendum)
-   Experiment with different smoothie ingredients.  (Remember to try some kale leaves or other leafy greens.) - Make a list of snacks using the handout provided as a starting point.   - Restaurants:  Don't go over-hungry!  - Consciously slow down your eating!  (You will feel full sooner.) - Physical activity:  - GOAL:  4 X wk to the gym (or neighborhood walk for at least 30 min).    - Remember the 5-minute rule!   - Design an alterative workout for home, i.e., 3 min jumping jacks, 20 Say pushups, walk up & down front steps 2 X, 3 min walk in place (knees to hands), etc... For 15-20 min.    - Record # of minutes you exercise.    - Be as active as you can be throughout the day.

## 2012-04-08 NOTE — Progress Notes (Signed)
Medical Nutrition Therapy:  Appt start time: 1330 end time:  1430.  Assessment:  Primary concerns today: Weight management and hypertension.  Shenell has eaten breakfast consistently, which is a significant change for her - 2 eggs w/ toast OR 8 oz plain [place] yogurt w/ 5 oz sweetened yogurt w/ nuts, seeds, blueberries, and sandwich thin w/ Laughing Cow cheese (35 kcal).  She has been eating out ~4 X wk, usually with her in-laws, which has been challenging to her.  She has determined that she makes better choices if she is not over-hungry, so she knows she needs to plan her snacks better.   Tieasha has joined [employer], but has had difficulty finding the time to go; has gone 7 times in 3 wks.  She works out of her home, puts in long hours, and sometimes does not feel motivated to go.    Progress Towards Goal(s):  In progress.    Nutritional Diagnosis:  Minor progress on NB-2.1 Physical inactivity As related to time constraints.  As evidenced by no regular activity. NI-1.5 Excessive energy intake As related to expenditure.  As evidenced by 20-lb weight gain in the past 18 months.   Intervention:  Nutrition education.  Monitoring/Evaluation:  Dietary intake, exercise, and body weight in 3 week(s).

## 2012-04-22 ENCOUNTER — Ambulatory Visit: Payer: BC Managed Care – PPO | Admitting: Family Medicine

## 2012-04-30 ENCOUNTER — Ambulatory Visit (INDEPENDENT_AMBULATORY_CARE_PROVIDER_SITE_OTHER): Payer: BC Managed Care – PPO | Admitting: Family Medicine

## 2012-04-30 ENCOUNTER — Encounter: Payer: Self-pay | Admitting: Family Medicine

## 2012-04-30 VITALS — BP 125/76 | HR 56 | Ht 68.5 in | Wt 376.0 lb

## 2012-04-30 DIAGNOSIS — I10 Essential (primary) hypertension: Secondary | ICD-10-CM

## 2012-04-30 NOTE — Progress Notes (Signed)
S: Pt comes in today for follow up.  HYPERTENSION BP: 125/76 Meds: lisino/HCTZ 10/25  Taking meds: No    # of doses missed/week: 0 Symptoms: Headache: No Dizziness: No Vision changes: No SOB:  No Chest pain: No LE swelling: No Tobacco use: No   OBESITY Patient is obese.  Diet: seeing Dr. Gerilyn Pilgrim; trying to eat dedicated breakfast of protein; working on staying away from fast food, "bad" snacks-- this is more difficult for her Is thinking about weight loss surgery  Exercise: walking- usually 2-3d/week; 20 min b/c then leg hurts/goes numb  Related conditions: HTN, OSA (on CPAP), degenerative disc disease  Wt Readings from Last 3 Encounters:  04/30/12 376 lb (170.552 kg)  04/08/12 377 lb 12.8 oz (171.369 kg)  03/20/12 378 lb (171.46 kg)     ROS: Per HPI  History  Smoking status  . Former Smoker  . Types: Cigarettes  . Quit date: 02/14/1995  Smokeless tobacco  . Never Used    O:  Filed Vitals:   04/30/12 1606  BP: 125/76  Pulse: 56    Gen: NAD CV: RRR, no murmur Pulm: CTA bilat, no wheezes or crackles Ext: Warm, no chronic skin changes, no edema   A/P: 47 y.o. female p/w obesity, HTN -See problem list -f/u in 1-2 months

## 2012-04-30 NOTE — Patient Instructions (Signed)
It was good to see you today!  Call [facility] and see when their next weight loss surgery seminar is-- their phone number is [phone]. If you are interested, feel free to make an appointment to meet with one of the surgeons.  If you need a referral, just call and let me know.   Keep up the good work with trying to change you eating habits.  Keep trying to exercise!  Come back to see me in 1-2 months and we will see how things are going.  Let me know if there is anything else I can do to help support you!

## 2012-04-30 NOTE — Assessment & Plan Note (Addendum)
Minimal weight loss, is considering weight loss surgery. Will have pt go to [facility] seminar. Continue f/u with Dr. Gerilyn Pilgrim and myself.

## 2012-04-30 NOTE — Assessment & Plan Note (Signed)
Normotensive, continue lisino 10, HCTZ 25

## 2012-05-09 ENCOUNTER — Ambulatory Visit (INDEPENDENT_AMBULATORY_CARE_PROVIDER_SITE_OTHER): Payer: BC Managed Care – PPO | Admitting: Family Medicine

## 2012-05-09 ENCOUNTER — Encounter: Payer: Self-pay | Admitting: Family Medicine

## 2012-05-09 VITALS — Ht 68.5 in | Wt 375.5 lb

## 2012-05-09 DIAGNOSIS — I10 Essential (primary) hypertension: Secondary | ICD-10-CM

## 2012-05-09 NOTE — Patient Instructions (Addendum)
-   For the next week:  - Track the time at which you get up and move away from your desk during the day, from 10:30 AM-11:30 PM. At the end of the day, count the total number of times.   - Email this info to [place].  -  Walking goal:  At least 30 minutes 5 X wk.    - Write down your minutes of walking each time, and track the total number of times per week.    - Design & WRITE DOWN a home workout.   This could include stepping in place, jumping in place, knees to palms, [place] pushups, stair running (come down slowly), partial squats, bent-knee ab    work on bed, arm strength exercise w/ dumbells.  (Remember the music!)  - Substitute home workout routine on rainy days.   - OR GETTING TO THE GYM:  It won't happen unless you plan for it.   - Flavoring your yogurt:  Start out using only 3-4 oz plain to 6 oz sweetened.   - Explore water exercise classes at the [facility]:  [phone]. - Aim for veg's twice a day! - Be mindful in all food decisions and in eating (i.e., slowly).

## 2012-05-09 NOTE — Progress Notes (Signed)
Medical Nutrition Therapy:  Appt start time: 1430 end time:  1530.  Assessment:  Primary concerns today: Weight management and hypertension.  [name] has been doing a bit more exercise; used her 5-minute rule some.  She is walking ~25 min 2-3 X wk, usually with her husband.  She feels she has been more active throughout the day, including getting up while she is at the computer.  We spent a long time talking about getting more physical activity.  [name] has never liked to exercise, and she does not feel motivated b/c of feeling good after exercise.  In fact, exercise often leaves her sore and uncomfortable.  She is tracking food intake and exercise on most days, using MyFitnessPal app.  Kcal intake is usually ~1800/day.  Still eating out about 3 X wk.  At yesterday's dinner, she split an entree with her husband.  [name] is still staying up late, but is usually getting ~7 1/2 hrs of sleep/night.  With her CPAP, she is getting much better quality sleep.  [name] has not yet decided if she is going to apply for bariatric surgery.    24-hr recall suggests intake of 1600-1700 kcal:  (UP at 10:30 AM) B (11:30 AM)-  2-egg sandwich on sandw rounds w/ 1 tsp olive oil, spinach, Laughing Cow chs (35 kcal), diet g ale  Snk ( AM)-  water L (4 PM)-  1 c [place] chili, diet g ale            Snk ( PM)-  none                 D (7:30 PM)-  Salad w/ tomatoes, <1 tbsp feta chs, 1 tbsp ranch dressing, chx cacciatore (w/ chs; ~5 oz chx & 1 1/2 pasta),  Snk (11:30)-  6 oz Activia Greek yogurt  Snk (2 AM)-  2 sugar-free popsicles  Progress Towards Goal(s):  In progress.    Nutritional Diagnosis:  Stable progress on NB-2.1 Physical inactivity As related to time constraints.  As evidenced by no regular activity. NI-1.5 Excessive energy intake As related to expenditure.  As evidenced by 20-lb weight gain in the past 18 months. Some progress on NI-1.5 above as evidenced by weight maintenance or even slight decrease in past 2  months.    Intervention:  Nutrition education.  Monitoring/Evaluation:  Dietary intake, exercise, and body weight in 4 week(s).

## 2012-06-10 ENCOUNTER — Encounter: Payer: Self-pay | Admitting: Family Medicine

## 2012-06-10 ENCOUNTER — Ambulatory Visit (INDEPENDENT_AMBULATORY_CARE_PROVIDER_SITE_OTHER): Payer: BC Managed Care – PPO | Admitting: Family Medicine

## 2012-06-10 VITALS — Ht 68.5 in | Wt 374.8 lb

## 2012-06-10 DIAGNOSIS — I10 Essential (primary) hypertension: Secondary | ICD-10-CM

## 2012-06-10 NOTE — Progress Notes (Signed)
Medical Nutrition Therapy:  Appt start time: 1430 end time:  1530.  Assessment:  Primary concerns today: Weight management and hypertension.  [name] said she has struggled with food choices this month.  She has been doing the water exercise classes 3 X wk at the [facility], so she is now getting 60-90 min exercise 3 X wk.  She feels her appetite has increased with the exercise, however. Usual routine on exercise days includes breakfast ~10:30 AM, lunch ~3 pm, class at 5:30 (but she water-walks from 5-5:30), then gets home ~7, showers, sits down to dinner ~9 PM  feeling ravenous.  If she goes out to eat, she is often not making good choices.    24-hr recall:  (UP at 12:30 PM)    B (1 PM)-  6 oz [place] fruit yogurt w/ 1 tbsp pecans & chia seeds, [place] flax sandw thin (100), 1 Laughing Cow chs (35), water Snk ( PM)-  none L (5 PM)-  3 oz tuna salad on sandw thin w/ mustard, pickles, 1 tsp mayo, diet soda Snk ( PM)-  none D (8:30 PM)  1 grilled chx breast, salad w/ 1 tbsp light dressing Snk (1 AM)-  2 sugar-free popsicles Did not get to bed till 4 AM.  [name] was up late studying for exams.  Breakfsat is usually yogurt or cottage cheese or eggs of some kind.  Lunch is sometimes deli meat sandwich w/ mustard, sometimes salad w/ meat or a [employer] chili.  Progress Towards Goal(s):  In progress.    Nutritional Diagnosis:  Good progress on NB-2.1 Physical inactivity As related to time constraints.  As evidenced by water exercise 60-90 min 3 X wk now. NI-1.5 Excessive energy intake As related to expenditure.  As evidenced by sense of not being in control of appetite on some days.    Intervention:  Nutrition education.  Monitoring/Evaluation:  Dietary intake, exercise, and body weight in 4 week(s).

## 2012-06-10 NOTE — Patient Instructions (Addendum)
-   Studying or otherwise, try to stay on a relatively consistent sleep schedule.   - Post-exercise snack:  1/2 peanut butter sandwich; 8 oz chocolate (fat-free) milk (made with least amount of Ovaltine you can use); string cheese w/ piece of fruit; 2-3 tbsp nuts w/ a piece of fruit.  - When making dinner:  Have some carrots or other raw veg's handy to snack on if you are still hungry.   - Food decisions:  Remember the 3 Qs of a good food decision:  - What am I in the mood for?; How hungry am I?; What's good for me?  - Also remember your food decisions algorithm.   - KEEP TEMPTING FOODS OUT OF THE HOUSE.  If you must have these foods around, try to at least freeze them, hide them, or keep small quantities.   - During your week break between classes, check out this website:  [email or web address]/.  Click on "Teaching Resources," then on "New Discoveries" (or similar words).   - After May 5, each time you make a food choice you regret, write about it:  What led to that choice?  What were your options?  Knowing what you know now, what would you do differently given a similar situation?  Bring to follow-up appt.    - Great job on the water exercise!

## 2012-06-24 ENCOUNTER — Other Ambulatory Visit: Payer: Self-pay | Admitting: Family Medicine

## 2012-07-11 ENCOUNTER — Encounter: Payer: Self-pay | Admitting: Family Medicine

## 2012-07-11 ENCOUNTER — Ambulatory Visit (INDEPENDENT_AMBULATORY_CARE_PROVIDER_SITE_OTHER): Payer: BC Managed Care – PPO | Admitting: Family Medicine

## 2012-07-11 VITALS — BP 103/74 | HR 66 | Ht 68.5 in | Wt 373.0 lb

## 2012-07-11 DIAGNOSIS — I10 Essential (primary) hypertension: Secondary | ICD-10-CM

## 2012-07-11 MED ORDER — HYDROCHLOROTHIAZIDE 25 MG PO TABS: 25.0000 mg | ORAL_TABLET | Freq: Every day | ORAL | Status: DC

## 2012-07-11 MED ORDER — LISINOPRIL 10 MG PO TABS: ORAL_TABLET | ORAL | Status: DC

## 2012-07-11 NOTE — Progress Notes (Signed)
S: Pt comes in today for follow up.  HYPERTENSION BP: 103/74 Meds: lisino 10, HCTZ 25 Taking meds: Yes     # of doses missed/week: 0 Symptoms: Headache: No Dizziness: No Vision changes: No SOB:  No Chest pain: No LE swelling: No Tobacco use: No   ROS: Per HPI  History  Smoking status  . Former Smoker  . Types: Cigarettes  . Quit date: 02/14/1995  Smokeless tobacco  . Never Used    O:  Filed Vitals:   07/11/12 1416  BP: 103/74  Pulse: 66    Gen: NAD CV: RRR, no murmur Pulm: CTA bilat, no wheezes or crackles Ext: Warm, no edema   A/P: 47 y.o. female p/w HTN -See problem list -f/u in 3 months

## 2012-07-11 NOTE — Patient Instructions (Signed)
It was good to see you.  Your blood pressure looks great!  Let us know if you start having any dizziness.  I would go ahead and schedule an appointment to talk with the surgeons!  Come back in 3-6 months for your blood pressure, sooner for any issues.

## 2012-07-11 NOTE — Assessment & Plan Note (Signed)
Normotensive, borderline on the low side.  Pt would like to continue both lisino and HCTZ for now-- would d/c lisino first as pt has good benefit from HCTZ on ankle swelling.

## 2012-07-16 ENCOUNTER — Ambulatory Visit (INDEPENDENT_AMBULATORY_CARE_PROVIDER_SITE_OTHER): Payer: BC Managed Care – PPO | Admitting: Family Medicine

## 2012-07-16 ENCOUNTER — Encounter: Payer: Self-pay | Admitting: Family Medicine

## 2012-07-16 VITALS — Ht 68.5 in | Wt 368.4 lb

## 2012-07-16 DIAGNOSIS — I10 Essential (primary) hypertension: Secondary | ICD-10-CM

## 2012-07-16 NOTE — Patient Instructions (Signed)
-   Continue to practice mindfulness w/ respect to food choices.   - Breakfast:  Include protein.   - Continue water exercise 3 X wk.  - Veg's 2 X day.    - The Three Ds of emotional eating:  - Delay, Distance, Distract.  - Develop a list of alternative activities that may help distract, and keep it handy   - Remember to include something physically active as an option   - Try writing not whole sentences, but feelings  - Delay:  Set a timer / alarm.     - Google 5:2 diet.  Keep looking until you find a good explanation of it from a reputable source Market researcher).    - [employer] your thoughts.

## 2012-07-16 NOTE — Progress Notes (Signed)
Medical Nutrition Therapy:  Appt start time: 1430 end time:  1530.  Assessment:  Primary concerns today: Weight management and hypertension.  Mee has been under a lot of stress; her daughter is pregnant, and expecting in September.  This will mean strained finances as well as time commitments anticipating helping to care for the baby.  She has usually managed to continue her water exercise most weeks (usually 3 X wk).  She has been going to bed ~2 or 3 AM, and is sleeping at least 7 hrs consistently.  She is using her CPAP consistently.   Trouble time for eating is after dinner, when Samiya wants to snack a lot.  She has been experiencing a burning sensation in her stomach, about 3-4 X wk, which seems to be alleviated by only junky foods rather than by fruits.  It actually seems to be more a function of keeping something in her stomach.    24-hr recall:  (UP at 10:30 AM)  B (11:30 AM)-  6 oz yogurt, 1 pkt instant plain oatmeal, <1 tbsp pecans, water Snk ( AM)-  diet ginger ale L (3 PM)-  3 oz tuna salad, 1 tsp mayo, 1 tbsp lite [place], pickles, on lettuce, carrots, mushrms, tom's Snk (5 PM)-  1 string cheese  D (7:30 PM)-  1 chsburger, let, tom, hot chips, unsweet tea Snk (11 PM)-  2 tbsp peanut butter, sandw thin, water Yesterday was normal except she rarely has hamburgers.  More typical is chx breast with salad. On some days she will eat more at night time, especially if her daughter cooks, and there are leftovers.    Progress Towards Goal(s):  In progress.    Nutritional Diagnosis:  Stable progress on NB-2.1 Physical inactivity As related to time constraints.  As evidenced by water exercise 60-90 min 3 X wk now. NI-1.5 Excessive energy intake As related to expenditure.  As evidenced by sense of not being in control of appetite on some days.    Intervention:  Nutrition education.  Monitoring/Evaluation:  Dietary intake, exercise, and body weight in 4 week(s).

## 2012-07-17 ENCOUNTER — Ambulatory Visit (INDEPENDENT_AMBULATORY_CARE_PROVIDER_SITE_OTHER): Payer: BC Managed Care – PPO | Admitting: Family Medicine

## 2012-07-17 VITALS — BP 132/64 | HR 65 | Temp 98.8°F | Ht 68.5 in | Wt 372.0 lb

## 2012-07-17 DIAGNOSIS — H01006 Unspecified blepharitis left eye, unspecified eyelid: Secondary | ICD-10-CM

## 2012-07-17 DIAGNOSIS — H01009 Unspecified blepharitis unspecified eye, unspecified eyelid: Secondary | ICD-10-CM

## 2012-07-17 MED ORDER — ERYTHROMYCIN 5 MG/GM OP OINT: TOPICAL_OINTMENT | Freq: Every day | OPHTHALMIC | Status: DC

## 2012-07-17 NOTE — Progress Notes (Signed)
  Subjective:    Patient ID: [name], female    DOB: 04/01/1965, 47 y.o.   MRN: [id number]  Conjunctivitis     1. Eyelid pain. 47 -year-old female who wears glasses, presents with left lateral eyelid pain. She noticed this upon awakening today. States that last night her eyes were itchy and she was rubbing them before. This morning she notes some upper eyelid puffiness and mild 4/10 pain.   She has not noticed any redness to the sclera, declining vision, headache, nausea, fever, chills, crest, discharge, photophobia. No known contacts with conjunctivitis. No history eye disorders. She does not wear contacts. She does sometimes work out in the pool 2-3 times per week.  Review of Systems See HPI otherwise negative.  reports that she quit smoking about 17 years ago. Her smoking use included Cigarettes. She smoked 0.00 packs per day. She has never used smokeless tobacco.     Objective:   Physical Exam  Vitals reviewed. Constitutional: She appears well-developed and well-nourished. No distress.  HENT:  No orbital tenderness to palpation, tearing, temporal tenderness.  No photophobia.   Eyes: Conjunctivae and EOM are normal. Pupils are equal, round, and reactive to light.  No conjunctival irritation or injection. PERRLA. EOMI. Mild tenderness at the superior lateral margin of the left eyelid, with slight edematous appearance. No lesions or abscesses palpated.  Visual fields intact to confrontation.  Florescein testing negative for abrasions or dendritic lesions.  Skin: She is not diaphoretic.        Assessment & Plan:

## 2012-07-17 NOTE — Patient Instructions (Addendum)
You may have some irritation of the eyelid margin/blepharitis. No sign of scratch or infection. Use the erythromycin ointment for 3-5 days. Use warm compresses three times daily.  Avoid eye makeup or lotions that could irritate. If you have vision problems, worsened symptoms, fever or oozing, then call doctor as these are signs of infection.   Blepharitis Blepharitis is a skin problem that makes your eyelids watery, red, puffy (swollen), crusty, scaly, or painful. It may also make your eyes itch. You may lose eyelashes. HOME CARE  Keep your hands clean.  Use a clean towel each time you dry your eyelids. Do not share towels or makeup with anyone.  Carefully wash your eyelids and eyelashes 2 times a day. Use warm water and baby shampoo or just water.  Wash your face and eyebrows at least once a day.  Hold a folded washcloth under warm water. Squeeze the water out. Put the warm washcloth on your eyes 2 times a day for 10 minutes, or as told by your doctor.  Apply medicated cream as told by your doctor.  Avoid rubbing your eyes.  Avoid wearing makeup until you get better.  Follow up with your doctor as told. GET HELP RIGHT AWAY IF:  Your pain, redness, or puffiness gets worse.  Your pain, redness, or puffiness spreads to other parts of your face.  Your vision changes, or you have pain when looking at lights or moving objects.  You have a fever.  You do not get better after 2 to 4 days. MAKE SURE YOU:  Understand these instructions.  Will watch your condition.  Will get help right away if you are not doing well or get worse. Document Released: 11/09/2007 Document Revised: 04/24/2011 Document Reviewed: 03/09/2010 [facility] Patient Information 2014 [place], [place].

## 2012-07-17 NOTE — Assessment & Plan Note (Signed)
No sign of trauma or orbital/ophthalmologic infection. Normal florescein exam. Likely represents an irritation of the external eyelid vs contact irritation. Will treat with topical warm compresses, erythromycin ointment as needed. Discussed signs of infection for which to seek return to care. Patient will follow up with PCP next week.

## 2012-07-22 ENCOUNTER — Encounter: Payer: Self-pay | Admitting: Family Medicine

## 2012-07-22 ENCOUNTER — Ambulatory Visit (INDEPENDENT_AMBULATORY_CARE_PROVIDER_SITE_OTHER): Payer: BC Managed Care – PPO | Admitting: Family Medicine

## 2012-07-22 ENCOUNTER — Ambulatory Visit: Payer: BC Managed Care – PPO | Admitting: Obstetrics & Gynecology

## 2012-07-22 VITALS — BP 133/84 | HR 71 | Ht 68.0 in | Wt 374.0 lb

## 2012-07-22 DIAGNOSIS — R198 Other specified symptoms and signs involving the digestive system and abdomen: Secondary | ICD-10-CM

## 2012-07-22 DIAGNOSIS — I10 Essential (primary) hypertension: Secondary | ICD-10-CM

## 2012-07-22 DIAGNOSIS — K219 Gastro-esophageal reflux disease without esophagitis: Secondary | ICD-10-CM

## 2012-07-22 DIAGNOSIS — R194 Change in bowel habit: Secondary | ICD-10-CM

## 2012-07-22 HISTORY — DX: Gastro-esophageal reflux disease without esophagitis: K21.9

## 2012-07-22 MED ORDER — PANTOPRAZOLE SODIUM 40 MG PO TBEC: 40.0000 mg | DELAYED_RELEASE_TABLET | Freq: Two times a day (BID) | ORAL | Status: DC

## 2012-07-22 NOTE — Assessment & Plan Note (Signed)
Pt reports IBS diagnosis- likely the cause of her current diarrhea given her increased stress.  Could refer back to Dr. Ewing Schlein.

## 2012-07-22 NOTE — Assessment & Plan Note (Signed)
Normotensive tend, cont lisino/HCTZ

## 2012-07-22 NOTE — Assessment & Plan Note (Signed)
Start daily PPI with intention to increase to BID if needed. Will send back to [facility] if this does not help for potential scope to r/o ulcer.

## 2012-07-22 NOTE — Patient Instructions (Addendum)
It was good to see you today.  For this stomach pain, take the protonix- you can start once per day but can increase to twice if needed.  Keep track of what you are eating before you have the bowel urgency.  We will see if there is a common thread.   Come back in a few weeks and we will see how you are doing.  If needed, we can send you back to the GI doctor.

## 2012-07-22 NOTE — Progress Notes (Signed)
S: Pt comes in today for follow up.  HYPERTENSION BP: 133/84 Meds: lisino 10, HCTZ 25 Taking meds: Yes     # of doses missed/week: 0 Symptoms: Headache: No Dizziness: No Vision changes: No SOB:  No Chest pain: No LE swelling: No Tobacco use: No   STOMACH ISSUES Saw Dr. Gerilyn Pilgrim last week who thought she needed to see MD.  She has noticed problems with her stomach feeling angry at night time; the only thing that helps is to keep it full.  Is over eating at night because she is trying to find something that calms her stomach down; has been going on for about 2 weeks.  Angry is kind of like a burning sensation.  Rarely gets nauseated, but no vomiting ever.  Takes prilosec PRN.  Feels like her IBS has come back again-- has diarrhea immediately after eating, has been happening again for the past 2-3 weeks.  Is not having diarrhea after every meal.  Does have urgency after some meals, usually after evening meal/dinner.  Has issues with stomach at least every other day, where she needs to take an acid reducer such as her prilosec or rolaids.  Laying on left side seems to help the angry feeling.  Eating helps the angry feeling, sometimes the acid reducer will help but not always.    No blood in her stool, but does have hemorrhoids and will occasionally have blood on the toilet paper, esp when her diarrhea gets worse.  No dark, tarry black stools.  No vomiting.   Has been under more stress than normal for the past month (when she found out her daughter is pregnant).     ROS: Per HPI  History  Smoking status  . Former Smoker  . Types: Cigarettes  . Quit date: 02/14/1995  Smokeless tobacco  . Never Used    O:  Filed Vitals:   07/22/12 1500  BP: 133/84  Pulse: 71    Gen: NAD CV: RRR, no murmur Pulm: CTA bilat, no wheezes or crackles Abd: soft, NT, normoactive BS   A/P: 47 y.o. female p/w reflux, IBS, HTN -See problem list -f/u in a few weeks

## 2012-07-25 ENCOUNTER — Telehealth: Payer: Self-pay | Admitting: Family Medicine

## 2012-07-25 NOTE — Telephone Encounter (Signed)
Patient woke up this morning running a 101 temp and generally feeling week.  She doesn't have any other symptoms really, but she would like to speak to the triage nurse to find out what other symptoms should bring her in to see the doctor.

## 2012-07-25 NOTE — Telephone Encounter (Signed)
Returning call from pt - " I woke up running a fever and just feeling bad - I don't have any respiratory symptoms" Recommended alternating Motrin and tylenol and encourage fluids and rest. Call for worsening symptoms, increased fever, lethargy, colored sputum. Pt verbalized understanding. Wyatt Haste, RN-BSN

## 2012-08-12 ENCOUNTER — Encounter: Payer: BC Managed Care – PPO | Admitting: Obstetrics & Gynecology

## 2012-08-13 NOTE — Progress Notes (Signed)
This encounter was created in error - please disregard.

## 2012-08-27 ENCOUNTER — Ambulatory Visit: Payer: BC Managed Care – PPO | Admitting: Family Medicine

## 2012-10-07 ENCOUNTER — Other Ambulatory Visit: Payer: Self-pay | Admitting: Obstetrics & Gynecology

## 2012-10-08 ENCOUNTER — Other Ambulatory Visit: Payer: Self-pay | Admitting: Family Medicine

## 2012-10-08 DIAGNOSIS — R748 Abnormal levels of other serum enzymes: Secondary | ICD-10-CM

## 2012-10-09 ENCOUNTER — Ambulatory Visit
Admission: RE | Admit: 2012-10-09 | Discharge: 2012-10-09 | Disposition: A | Discharge status: Home / Self Care | Payer: BC Managed Care – PPO | Source: Ambulatory Visit | Attending: Family Medicine | Admitting: Family Medicine

## 2012-10-09 ENCOUNTER — Encounter (INDEPENDENT_AMBULATORY_CARE_PROVIDER_SITE_OTHER): Payer: Self-pay | Admitting: General Surgery

## 2012-10-09 ENCOUNTER — Ambulatory Visit (INDEPENDENT_AMBULATORY_CARE_PROVIDER_SITE_OTHER): Payer: BC Managed Care – PPO | Admitting: General Surgery

## 2012-10-09 VITALS — BP 128/86 | HR 71 | Temp 97.6°F | Resp 16 | Ht 68.0 in | Wt 371.6 lb

## 2012-10-09 DIAGNOSIS — G4733 Obstructive sleep apnea (adult) (pediatric): Secondary | ICD-10-CM

## 2012-10-09 DIAGNOSIS — E785 Hyperlipidemia, unspecified: Secondary | ICD-10-CM

## 2012-10-09 DIAGNOSIS — I10 Essential (primary) hypertension: Secondary | ICD-10-CM

## 2012-10-09 DIAGNOSIS — K219 Gastro-esophageal reflux disease without esophagitis: Secondary | ICD-10-CM

## 2012-10-09 DIAGNOSIS — R748 Abnormal levels of other serum enzymes: Secondary | ICD-10-CM

## 2012-10-09 NOTE — Progress Notes (Signed)
Patient ID: [name], female   DOB: [birth year], 47 y.o.   MRN: [id number]  Chief Complaint  Patient presents with  . Weight Loss Surgery    HPI [name] is a 47 y.o. female.  This patient presents for her initial weight loss surgery evaluation. She has a tender information session and is interested in either the sleeve gastrectomy for gastric bypass. She has a BMI of 56 with obesity related comorbidities of hypertension, GERD, obstructive sleep apnea requiring CPAP, and hyperlipidemia. She is troubled with her weight Avelino Leeds she was 47 years old and has gradually increased her weight since then. She has tried several diets including Phen-Fen in the 1990s as well as Meridia and hypnosis. She says that she does do water aerobics 3 times per week but has backed off of this recently. She can walk but is limited by her back pain. She also says that she does have "bad" reflux but she is not currently taking her Protonix because this made her nauseated. HPI  Past Medical History  Diagnosis Date  . Hypertension   . Cystocele   . Rectocele   . Dysfunctional uterine bleeding   . Anemia     Hgb 5 02/18/10, blood transfusion 02/18/10  . Depression   . PCOS (polycystic ovarian syndrome)   . Obesity, Class III, BMI 40-49.9 (morbid obesity)   . Cervical radiculopathy at C6 01/25/2011  . Lateral epicondylitis  of elbow 02/01/2011  . Acid reflux 07/22/2012  . OSA on CPAP   . Morbid obesity     Past Surgical History  Procedure Laterality Date  . Cholecystectomy  1993  . Vaginal delivery  1993  . Abdominal hysterectomy      Family History  Problem Relation Age of Onset  . Colon cancer Maternal Uncle   . Hyperlipidemia Mother   . Hypertension Mother   . Hyperlipidemia Father   . Hypertension Father   . Heart attack Father 77    5-bypass surgery  . Heart disease Father   . Diabetes Maternal Aunt   . Cancer Paternal Uncle     colon    Social History History  Substance Use Topics  .  Smoking status: Former Smoker    Types: Cigarettes    Quit date: 02/14/1995  . Smokeless tobacco: Never Used  . Alcohol Use: No    No Known Allergies  Current Outpatient Prescriptions  Medication Sig Dispense Refill  . erythromycin ophthalmic ointment Place into the left eye at bedtime.  3.5 g  0  . hydrochlorothiazide (HYDRODIURIL) 25 MG tablet Take 1 tablet (25 mg total) by mouth daily.  90 tablet  2  . lisinopril (PRINIVIL,ZESTRIL) 10 MG tablet TAKE ONE TABLET BY MOUTH EVERY DAY  90 tablet  2  . oxybutynin (DITROPAN) 5 MG tablet Take 1 tablet (5 mg total) by mouth 2 (two) times daily.  60 tablet  12  . estradiol (ESTRACE) 1 MG tablet TAKE TWO TABLETS BY MOUTH EVERY DAY  30 tablet  0  . furosemide (LASIX) 20 MG tablet Take 1 tablet (20 mg total) by mouth daily.  90 tablet  1  . gabapentin (NEURONTIN) 300 MG capsule Take 1 capsule (300 mg total) by mouth at bedtime.  30 capsule  1  . omeprazole (PRILOSEC) 20 MG capsule Take 1 capsule (20 mg total) by mouth daily.  30 capsule  0  . pantoprazole (PROTONIX) 40 MG tablet Take 1 tablet (40 mg total) by mouth 2 (two) times  daily.  60 tablet  3   No current facility-administered medications for this visit.    Review of Systems Review of Systems All other review of systems negative or noncontributory except as stated in the HPI  Blood pressure 128/86, pulse 71, temperature 97.6 F (36.4 C), temperature source Temporal, resp. rate 16, height 5\' 8"  (1.727 m), weight 371 lb 9.6 oz (168.557 kg), last menstrual period 03/21/2010.  Physical Exam Physical Exam Physical Exam  Nursing note and vitals reviewed. Constitutional: She is oriented to person, place, and time. She appears well-developed and well-nourished. No distress.  HENT:  Head: Normocephalic and atraumatic.  Mouth/Throat: No oropharyngeal exudate.  Eyes: Conjunctivae and EOM are normal. Pupils are equal, round, and reactive to light. Right eye exhibits no discharge. Left eye  exhibits no discharge. No scleral icterus.  Neck: Normal range of motion. Neck supple. No tracheal deviation present.  Cardiovascular: Normal rate, regular rhythm, normal heart sounds and intact distal pulses.   Pulmonary/Chest: Effort normal and breath sounds normal. No stridor. No respiratory distress. She has no wheezes.  Abdominal: Soft. Bowel sounds are normal. She exhibits no distension and no mass. There is no tenderness. There is no rebound and no guarding. Lower midline incision Musculoskeletal: Normal range of motion. She exhibits no edema and no tenderness.  Neurological: She is alert and oriented to person, place, and time.  Skin: Skin is warm and dry. No rash noted. She is not diaphoretic. No erythema. No pallor.  Psychiatric: She has a normal mood and affect. Her behavior is normal. Judgment and thought content normal.    Data Reviewed   Assessment    Morbid obesity with a BMI of 56 and obstructive sleep apnea, hypertension, GERD, and hyperlipidemia We are long discussion about the medical and surgical options for weight loss.  We discussed the risks and benefits of the lap band, sleeve gastrectomy, and Roux-en-Y gastric bypass. She is leaning towards gastric bypass after our discussion which I think would probably be the best option for her given her relative immobility and her reflux. Counseled her that her reflux may be worse with a sleeve gastrectomy and I'm not sure that these procedures would get her to the weight that she desires. The risks of infection, bleeding, pain, scarring, weight regain, too little or too much weight loss, vitamin deficiencies and need for lifelong vitamin supplementation, hair loss, need for protein supplementation, leaks, stricture, reflux, food intolerance, need for reoperation , need for open surgery, injury to spleen or surrounding structures, DVT's, PE, and death again discussed with the patient and the patient expressed understanding and desires to  proceed with laparoscopic RYGB, possible open, intraoperative endoscopy.     Plan    We will set her up with the nutrition and psychology evaluation and preoperative labs and UGI.  She will also need echo due to history of phen fen.        Lodema Pilot DAVID 10/09/2012, 5:35 PM

## 2012-10-10 LAB — LIPID PANEL
Cholesterol: 166 mg/dL (ref 0–200)
HDL: 44 mg/dL (ref >39)
Total CHOL/HDL Ratio: 3.8 Ratio
VLDL: 26 mg/dL (ref 0–40)

## 2012-10-10 LAB — COMPREHENSIVE METABOLIC PANEL
ALT: 49 U/L — ABNORMAL HIGH (ref 0–35)
CO2: 29 mEq/L (ref 19–32)
Calcium: 9.3 mg/dL (ref 8.4–10.5)
Chloride: 103 mEq/L (ref 96–112)
Creat: 0.98 mg/dL (ref 0.50–1.10)
Glucose, Bld: 100 mg/dL — ABNORMAL HIGH (ref 70–99)
Sodium: 140 mEq/L (ref 135–145)
Total Protein: 7.1 g/dL (ref 6.0–8.3)

## 2012-10-10 LAB — CBC WITH DIFFERENTIAL/PLATELET
Eosinophils Absolute: 0.3 10*3/uL (ref 0.0–0.7)
Eosinophils Relative: 5 % (ref 0–5)
Hemoglobin: 14.9 g/dL (ref 12.0–15.0)
Lymphocytes Relative: 30 % (ref 12–46)
Lymphs Abs: 1.8 10*3/uL (ref 0.7–4.0)
MCH: 30.8 pg (ref 26.0–34.0)
MCV: 90.7 fL (ref 78.0–100.0)
Monocytes Relative: 9 % (ref 3–12)
Platelets: 274 10*3/uL (ref 150–400)
RBC: 4.84 MIL/uL (ref 3.87–5.11)
WBC: 6.1 10*3/uL (ref 4.0–10.5)

## 2012-10-10 LAB — TSH: TSH: 2.974 u[IU]/mL (ref 0.350–4.500)

## 2012-10-10 LAB — HEMOGLOBIN A1C: Mean Plasma Glucose: 123 mg/dL — ABNORMAL HIGH (ref <117)

## 2012-10-16 ENCOUNTER — Other Ambulatory Visit: Payer: Self-pay

## 2012-10-16 ENCOUNTER — Ambulatory Visit (HOSPITAL_COMMUNITY)
Admission: RE | Admit: 2012-10-16 | Discharge: 2012-10-16 | Disposition: A | Discharge status: Home / Self Care | Payer: BC Managed Care – PPO | Source: Ambulatory Visit | Attending: General Surgery | Admitting: General Surgery

## 2012-10-16 DIAGNOSIS — K589 Irritable bowel syndrome without diarrhea: Secondary | ICD-10-CM | POA: Insufficient documentation

## 2012-10-16 DIAGNOSIS — Z9089 Acquired absence of other organs: Secondary | ICD-10-CM | POA: Insufficient documentation

## 2012-10-16 DIAGNOSIS — Z6841 Body Mass Index (BMI) 40.0 and over, adult: Secondary | ICD-10-CM | POA: Insufficient documentation

## 2012-10-16 DIAGNOSIS — G4733 Obstructive sleep apnea (adult) (pediatric): Secondary | ICD-10-CM | POA: Insufficient documentation

## 2012-10-16 DIAGNOSIS — I10 Essential (primary) hypertension: Secondary | ICD-10-CM | POA: Insufficient documentation

## 2012-10-16 DIAGNOSIS — E785 Hyperlipidemia, unspecified: Secondary | ICD-10-CM | POA: Insufficient documentation

## 2012-10-16 DIAGNOSIS — K219 Gastro-esophageal reflux disease without esophagitis: Secondary | ICD-10-CM | POA: Insufficient documentation

## 2012-10-28 ENCOUNTER — Other Ambulatory Visit (HOSPITAL_COMMUNITY): Payer: Self-pay | Admitting: Family Medicine

## 2012-10-28 DIAGNOSIS — Z1231 Encounter for screening mammogram for malignant neoplasm of breast: Secondary | ICD-10-CM

## 2012-11-04 ENCOUNTER — Encounter: Payer: Self-pay | Admitting: Dietician

## 2012-11-04 ENCOUNTER — Encounter: Payer: BC Managed Care – PPO | Attending: General Surgery | Admitting: Dietician

## 2012-11-04 DIAGNOSIS — Z713 Dietary counseling and surveillance: Secondary | ICD-10-CM | POA: Insufficient documentation

## 2012-11-04 NOTE — Progress Notes (Signed)
  Pre-Op Assessment Visit:  Pre-Operative RYGB or Sleeve Gastrectomy Surgery  Medical Nutrition Therapy:  Appt start time: 1130   End time:  1215.  Patient was seen on 11/04/2012 for Pre-Operative Bariatric Nutrition Assessment. Assessment and letter of approval faxed to [facility] Bariatric Surgery Program coordinator on 11/04/2012.   Handouts given during visit include:  Pre-Op Goals Bariatric Surgery Protein Shakes  Supplements given during visit include:  PB2: Exp 02/25/2013, no Lot #  Patient to call the [facility] to enroll in Pre-Op and Post-Op Nutrition Education when surgery date is scheduled.

## 2012-11-04 NOTE — Patient Instructions (Addendum)
Patient to call the [facility] to enroll in Pre-Op and Post-Op Nutrition Education when surgery date is scheduled. 

## 2012-11-20 ENCOUNTER — Other Ambulatory Visit (INDEPENDENT_AMBULATORY_CARE_PROVIDER_SITE_OTHER): Payer: Self-pay | Admitting: General Surgery

## 2012-11-29 ENCOUNTER — Ambulatory Visit (HOSPITAL_COMMUNITY)
Admission: RE | Admit: 2012-11-29 | Discharge: 2012-11-29 | Disposition: A | Discharge status: Home / Self Care | Payer: BC Managed Care – PPO | Source: Ambulatory Visit | Attending: Family Medicine | Admitting: Family Medicine

## 2012-11-29 DIAGNOSIS — Z1231 Encounter for screening mammogram for malignant neoplasm of breast: Secondary | ICD-10-CM

## 2012-12-12 ENCOUNTER — Encounter (HOSPITAL_COMMUNITY): Payer: Self-pay | Admitting: Pharmacy Technician

## 2012-12-12 ENCOUNTER — Encounter: Payer: BC Managed Care – PPO | Attending: General Surgery

## 2012-12-12 DIAGNOSIS — Z713 Dietary counseling and surveillance: Secondary | ICD-10-CM | POA: Insufficient documentation

## 2012-12-13 NOTE — Progress Notes (Signed)
Pre-Operative Nursing and Nutrition Class:  Appt start time: 1730   End time:  1930. Patient was seen on 12/12/12 for Pre-Operative Bariatric Surgery Education at the [facility].  Surgery date: 12/23/12 Surgery type: RYGB The following the learning objectives were met by the patient during this course: Identify Pre-Op Dietary Goals and will begin 2 weeks pre-operatively Identify appropriate sources of fluids and proteins   State protein recommendations and appropriate sources pre and post-operatively Identify Post-Operative Dietary Goals and will follow for 2 weeks post-operatively Identify appropriate multivitamin and calcium sources Describe the need for physical activity post-operatively and will follow MD recommendations State when to call healthcare provider regarding medication questions or post-operative complications Handouts given during class include: Pre-Op Bariatric Surgery Diet Handout Protein Shake Handout Post-Op Bariatric Surgery Nutrition Handout BELT Program Information Flyer Support Group Information Flyer [facility] Bariatric Supplements Price List Follow-Up Plan: Patient will follow-up at [facility] 2 weeks post operatively for diet advancement per MD. Patient Instructions Follow:  Pre-Op Diet per MD 2 weeks prior to surgery   Phase 2- Liquids (clear/full) 2 weeks after surgery   Vitamin/Mineral/Calcium guidelines for purchasing bariatric supplements   Exercise guidelines pre and post-op per MD   Follow-up at [facility] in 2 weeks post-op for diet advancement. Contact [facility] at [phone] or Skip Estimable [phone] [email or web address] with any questions or concerns Follow-up and Disposition    Return in about 2 weeks (01/06/13) for 2 Week Post-Op Class.

## 2012-12-17 ENCOUNTER — Encounter (HOSPITAL_COMMUNITY): Payer: Self-pay

## 2012-12-17 ENCOUNTER — Encounter (HOSPITAL_COMMUNITY)
Admission: RE | Admit: 2012-12-17 | Discharge: 2012-12-17 | Disposition: A | Discharge status: Home / Self Care | Payer: BC Managed Care – PPO | Source: Ambulatory Visit | Attending: General Surgery | Admitting: General Surgery

## 2012-12-17 ENCOUNTER — Ambulatory Visit (HOSPITAL_COMMUNITY)
Admission: RE | Admit: 2012-12-17 | Discharge: 2012-12-17 | Disposition: A | Discharge status: Home / Self Care | Payer: BC Managed Care – PPO | Source: Ambulatory Visit | Attending: General Surgery | Admitting: General Surgery

## 2012-12-17 DIAGNOSIS — Z01818 Encounter for other preprocedural examination: Secondary | ICD-10-CM | POA: Insufficient documentation

## 2012-12-17 DIAGNOSIS — Z01812 Encounter for preprocedural laboratory examination: Secondary | ICD-10-CM | POA: Insufficient documentation

## 2012-12-17 HISTORY — DX: Other specified postprocedural states: R11.2

## 2012-12-17 HISTORY — DX: Nausea with vomiting, unspecified: Z98.890

## 2012-12-17 LAB — COMPREHENSIVE METABOLIC PANEL
ALT: 55 U/L — ABNORMAL HIGH (ref 0–35)
Albumin: 4 g/dL (ref 3.5–5.2)
Alkaline Phosphatase: 61 U/L (ref 39–117)
BUN: 23 mg/dL (ref 6–23)
Chloride: 95 mEq/L — ABNORMAL LOW (ref 96–112)
GFR calc Af Amer: 77 mL/min — ABNORMAL LOW (ref >90)
Glucose, Bld: 90 mg/dL (ref 70–99)
Potassium: 4 mEq/L (ref 3.5–5.1)
Sodium: 134 mEq/L — ABNORMAL LOW (ref 135–145)
Total Bilirubin: 0.4 mg/dL (ref 0.3–1.2)

## 2012-12-17 LAB — CBC WITH DIFFERENTIAL/PLATELET
Basophils Absolute: 0 K/uL (ref 0.0–0.1)
Basophils Relative: 0 % (ref 0–1)
Eosinophils Absolute: 0.3 K/uL (ref 0.0–0.7)
Eosinophils Relative: 3 % (ref 0–5)
HCT: 45.1 % (ref 36.0–46.0)
Hemoglobin: 15.4 g/dL — ABNORMAL HIGH (ref 12.0–15.0)
Lymphocytes Relative: 29 % (ref 12–46)
Lymphs Abs: 2.4 K/uL (ref 0.7–4.0)
MCH: 31.4 pg (ref 26.0–34.0)
MCHC: 34.1 g/dL (ref 30.0–36.0)
MCV: 91.9 fL (ref 78.0–100.0)
Monocytes Absolute: 0.7 K/uL (ref 0.1–1.0)
Monocytes Relative: 8 % (ref 3–12)
Neutro Abs: 4.9 K/uL (ref 1.7–7.7)
Neutrophils Relative %: 59 % (ref 43–77)
Platelets: 286 K/uL (ref 150–400)
RBC: 4.91 MIL/uL (ref 3.87–5.11)
RDW: 12.7 % (ref 11.5–15.5)
WBC: 8.3 K/uL (ref 4.0–10.5)

## 2012-12-17 NOTE — Patient Instructions (Addendum)
20 [name]  12/17/2012   Your procedure is scheduled on:  11-10 -2014  Report to [name] at     1045   AM   Call this number if you have problems the morning of surgery: [phone]  Or Presurgical Testing [phone]([name])   Remember: Follow any bowel prep instructions per MD office. For Cpap use: Bring mask and tubing only.   Do not eat food:After Midnight.    Take these medicines the morning of surgery with A SIP OF WATER: none   Do not wear jewelry, make-up or nail polish.  Do not wear lotions, powders, or perfumes. You may wear deodorant.  Do not shave 12 hours prior to first CHG shower(legs and under arms).(face and neck okay.)  Do not bring valuables to the hospital.  Contacts, dentures or removable bridgework, body piercing, hair pins may not be worn into surgery.  Leave suitcase in the car. After surgery it may be brought to your room.  For patients admitted to the hospital, checkout time is 11:00 AM the day of discharge.   Patients discharged the day of surgery will not be allowed to drive home. Must have responsible person with you x 24 hours once discharged.  Name and phone number of your driver: [name] [phone] cell  Special Instructions: CHG(Chlorhedine 4%-"Hibiclens","Betasept","Aplicare") Shower Use Special Wash: see special instructions.(avoid face and genitals)   Please read over the following fact sheets that you were given:  Incentive Spirometry Instruction.    Failure to follow these instructions may result in Cancellation of your surgery.   Patient signature_______________________________________________________

## 2012-12-17 NOTE — Pre-Procedure Instructions (Addendum)
12-17-12 EKG 9'14. CXR done today. Per Dr. Council Mechanic -will see patient Am of of surgery day. 12-18-12 1505 Note to Dr. Delice Lesch office to note labs viewable in Epic. 12-20-12 1020 Patient notified of surgery time change to 0715 AM , to arrive at 0515 AM to short stay , Nothing by mouth past midnight.

## 2012-12-18 NOTE — Progress Notes (Signed)
12-18-12 labs viewable in Epic.

## 2012-12-19 ENCOUNTER — Other Ambulatory Visit: Payer: Self-pay

## 2012-12-19 ENCOUNTER — Encounter (INDEPENDENT_AMBULATORY_CARE_PROVIDER_SITE_OTHER): Payer: Self-pay | Admitting: General Surgery

## 2012-12-19 ENCOUNTER — Other Ambulatory Visit (INDEPENDENT_AMBULATORY_CARE_PROVIDER_SITE_OTHER): Payer: Self-pay

## 2012-12-19 ENCOUNTER — Ambulatory Visit (INDEPENDENT_AMBULATORY_CARE_PROVIDER_SITE_OTHER): Payer: BC Managed Care – PPO | Admitting: General Surgery

## 2012-12-19 DIAGNOSIS — Z6841 Body Mass Index (BMI) 40.0 and over, adult: Secondary | ICD-10-CM

## 2012-12-19 DIAGNOSIS — I10 Essential (primary) hypertension: Secondary | ICD-10-CM

## 2012-12-19 DIAGNOSIS — Z01810 Encounter for preprocedural cardiovascular examination: Secondary | ICD-10-CM

## 2012-12-19 NOTE — Progress Notes (Signed)
Patient ID: [name], female   DOB: 01/24/1966, 47 y.o.   MRN: [id number]  No chief complaint on file.   HPI [name] is a 47 y.o. female.  Patient comes in today for her preoperative surgery evaluation in preparation for Roux-en-Y gastric bypass. She has a BMI of 56 with her obesity related comorbidities of hypertension, GERD, obstructive sleep apnea and hyperlipidemia. She has completed her preoperative surgery evaluation except for the echocardiogram. This was denied by her insurance. She denies any heart problems and says that she has a good exercise tolerance. She continues to have intermittent reflux it has decided on Roux-en-Y gastric bypass do to her reflux and in order to achieve maximal weight loss. HPI  Past Medical History  Diagnosis Date  . Hypertension   . Cystocele   . Rectocele     stress incontinence  . Dysfunctional uterine bleeding     resolved after  Hysterectomy(hx. fibroids)  . Anemia     Hgb 5 02/18/10, blood transfusion 02/18/10  . Depression   . PCOS (polycystic ovarian syndrome)   . Obesity, Class III, BMI 40-49.9 (morbid obesity)   . Cervical radiculopathy at C6 01/25/2011  . Lateral epicondylitis  of elbow 02/01/2011  . Morbid obesity   . OSA on CPAP     cpap used nightly, settings 13  . PONV (postoperative nausea and vomiting)   . Acid reflux 07/22/2012    much improved with Bariatric diet    Past Surgical History  Procedure Laterality Date  . Vaginal delivery  1993  . Abdominal hysterectomy    . Cholecystectomy  1993    laparoscopic    Family History  Problem Relation Age of Onset  . Colon cancer Maternal Uncle   . Hyperlipidemia Mother   . Hypertension Mother   . Hyperlipidemia Father   . Hypertension Father   . Heart attack Father 73    5-bypass surgery  . Heart disease Father   . Diabetes Maternal Aunt   . Cancer Paternal Uncle     colon    Social History History  Substance Use Topics  . Smoking status: Former Smoker   Types: Cigarettes    Quit date: 02/14/1995  . Smokeless tobacco: Never Used  . Alcohol Use: No    No Known Allergies  Current Outpatient Prescriptions  Medication Sig Dispense Refill  . cyanocobalamin 500 MCG tablet Take 500 mcg by mouth daily.      . estradiol (ESTRACE) 1 MG tablet Take 1 mg by mouth daily as needed.      . hydrochlorothiazide (HYDRODIURIL) 25 MG tablet Take 25 mg by mouth daily.      . lisinopril (PRINIVIL,ZESTRIL) 10 MG tablet Take 10 mg by mouth daily. 12-16-12 Lisinopril dose decreased to 5mg  Orally  Daily.      . omeprazole (PRILOSEC) 20 MG capsule Take 20 mg by mouth daily as needed (heart burn).      . oxybutynin (DITROPAN) 5 MG tablet Take 5 mg by mouth daily as needed (urinary).      . pantoprazole (PROTONIX) 40 MG tablet Take 40 mg by mouth daily as needed (heartburn).       No current facility-administered medications for this visit.    Review of Systems Review of Systems All other review of systems negative or noncontributory except as stated in the HPI  Last menstrual period 03/21/2010.  Physical Exam Physical Exam Physical Exam  Nursing note and vitals reviewed. Constitutional: She is oriented   to person, place, and time. She appears well-developed and well-nourished. No distress.  HENT:  Head: Normocephalic and atraumatic.  Mouth/Throat: No oropharyngeal exudate.  Eyes: Conjunctivae and EOM are normal. Pupils are equal, round, and reactive to light. Right eye exhibits no discharge. Left eye exhibits no discharge. No scleral icterus.  Neck: Normal range of motion. Neck supple. No tracheal deviation present.  Cardiovascular: Normal rate, regular rhythm, normal heart sounds and intact distal pulses.   Pulmonary/Chest: Effort normal and breath sounds normal. No stridor. No respiratory distress. She has no wheezes.  Abdominal: Soft. Bowel sounds are normal. She exhibits no distension and no mass. There is no tenderness. There is no rebound and no  guarding.  Musculoskeletal: Normal range of motion. She exhibits no edema and no tenderness.  Neurological: She is alert and oriented to person, place, and time.  Skin: Skin is warm and dry. No rash noted. She is not diaphoretic. No erythema. No pallor.  Psychiatric: She has a normal mood and affect. Her behavior is normal. Judgment and thought content normal.    Data Reviewed   Assessment    Morbid obesity with a BMI of 56 and obesity related comorbidities of hypertension, hyperlipidemia, GERD, and obstructive sleep apnea We again discussed the options for weight loss and she remains interested in Roux-en-Y gastric bypass. We discussed the procedure and its risks in the perioperative course. The risks of infection, bleeding, pain, scarring, weight regain, too little or too much weight loss, vitamin deficiencies and need for lifelong vitamin supplementation, hair loss, need for protein supplementation, leaks, stricture, reflux, food intolerance, need for reoperation , need for open surgery, injury to spleen or surrounding structures, DVT's, PE, and death again discussed with the patient and the patient expressed understanding and desires to proceed with laparoscopic RYGB, possible open, intraoperative endoscopy.  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 We have her set up for this tomorrow and if this is significantly abnormal we will need to hold off on her procedure but otherwise we will proceed with Roux-en-Y gastric bypass on Monday.       Plan    we will plan for Roux-en-Y gastric bypass on Monday and plan for echocardiogram tomorrow.         Semaje Kinker DAVID 12/19/2012, 4:48 PM    

## 2012-12-20 ENCOUNTER — Ambulatory Visit (HOSPITAL_COMMUNITY)
Admission: RE | Admit: 2012-12-20 | Discharge: 2012-12-20 | Disposition: A | Discharge status: Home / Self Care | Payer: BC Managed Care – PPO | Source: Ambulatory Visit | Attending: General Surgery | Admitting: General Surgery

## 2012-12-20 DIAGNOSIS — I10 Essential (primary) hypertension: Secondary | ICD-10-CM | POA: Insufficient documentation

## 2012-12-20 DIAGNOSIS — Z01818 Encounter for other preprocedural examination: Secondary | ICD-10-CM | POA: Insufficient documentation

## 2012-12-20 DIAGNOSIS — E785 Hyperlipidemia, unspecified: Secondary | ICD-10-CM | POA: Insufficient documentation

## 2012-12-20 DIAGNOSIS — I517 Cardiomegaly: Secondary | ICD-10-CM

## 2012-12-20 DIAGNOSIS — K219 Gastro-esophageal reflux disease without esophagitis: Secondary | ICD-10-CM | POA: Insufficient documentation

## 2012-12-20 DIAGNOSIS — I059 Rheumatic mitral valve disease, unspecified: Secondary | ICD-10-CM | POA: Insufficient documentation

## 2012-12-20 DIAGNOSIS — Z01810 Encounter for preprocedural cardiovascular examination: Secondary | ICD-10-CM | POA: Insufficient documentation

## 2012-12-22 ENCOUNTER — Telehealth (INDEPENDENT_AMBULATORY_CARE_PROVIDER_SITE_OTHER): Payer: Self-pay | Admitting: General Surgery

## 2012-12-22 NOTE — Telephone Encounter (Signed)
I updated the patient on her echo results and reminded her to stay on liquids only today.

## 2012-12-23 ENCOUNTER — Encounter (HOSPITAL_COMMUNITY): Payer: BC Managed Care – PPO | Admitting: Anesthesiology

## 2012-12-23 ENCOUNTER — Encounter (HOSPITAL_COMMUNITY): Payer: Self-pay | Admitting: *Deleted

## 2012-12-23 ENCOUNTER — Inpatient Hospital Stay (HOSPITAL_COMMUNITY)
Admission: RE | Admit: 2012-12-23 | Discharge: 2012-12-25 | DRG: 621 | Disposition: A | Discharge status: Home / Self Care | Payer: BC Managed Care – PPO | Source: Ambulatory Visit | Attending: General Surgery | Admitting: General Surgery

## 2012-12-23 ENCOUNTER — Inpatient Hospital Stay (HOSPITAL_COMMUNITY): Payer: BC Managed Care – PPO | Admitting: Anesthesiology

## 2012-12-23 ENCOUNTER — Encounter (HOSPITAL_COMMUNITY)
Admission: RE | Disposition: A | Discharge status: Home / Self Care | Payer: Self-pay | Source: Ambulatory Visit | Attending: General Surgery

## 2012-12-23 DIAGNOSIS — M5412 Radiculopathy, cervical region: Secondary | ICD-10-CM | POA: Diagnosis present

## 2012-12-23 DIAGNOSIS — E785 Hyperlipidemia, unspecified: Secondary | ICD-10-CM | POA: Diagnosis present

## 2012-12-23 DIAGNOSIS — I10 Essential (primary) hypertension: Secondary | ICD-10-CM

## 2012-12-23 DIAGNOSIS — K429 Umbilical hernia without obstruction or gangrene: Secondary | ICD-10-CM | POA: Diagnosis present

## 2012-12-23 DIAGNOSIS — Z800 Family history of malignant neoplasm of digestive organs: Secondary | ICD-10-CM

## 2012-12-23 DIAGNOSIS — G4733 Obstructive sleep apnea (adult) (pediatric): Secondary | ICD-10-CM

## 2012-12-23 DIAGNOSIS — Z87891 Personal history of nicotine dependence: Secondary | ICD-10-CM

## 2012-12-23 DIAGNOSIS — Z6841 Body Mass Index (BMI) 40.0 and over, adult: Secondary | ICD-10-CM

## 2012-12-23 DIAGNOSIS — Z833 Family history of diabetes mellitus: Secondary | ICD-10-CM

## 2012-12-23 DIAGNOSIS — Z90710 Acquired absence of both cervix and uterus: Secondary | ICD-10-CM

## 2012-12-23 DIAGNOSIS — K219 Gastro-esophageal reflux disease without esophagitis: Secondary | ICD-10-CM | POA: Diagnosis present

## 2012-12-23 DIAGNOSIS — K432 Incisional hernia without obstruction or gangrene: Secondary | ICD-10-CM | POA: Diagnosis present

## 2012-12-23 DIAGNOSIS — R110 Nausea: Secondary | ICD-10-CM | POA: Diagnosis present

## 2012-12-23 DIAGNOSIS — Z8249 Family history of ischemic heart disease and other diseases of the circulatory system: Secondary | ICD-10-CM

## 2012-12-23 HISTORY — PX: GASTRIC ROUX-EN-Y: SHX5262

## 2012-12-23 HISTORY — PX: UPPER GI ENDOSCOPY: SHX6162

## 2012-12-23 SURGERY — LAPAROSCOPIC ROUX-EN-Y GASTRIC BYPASS WITH UPPER ENDOSCOPY
Anesthesia: General | Site: Abdomen | Wound class: Clean Contaminated

## 2012-12-23 MED ORDER — LIDOCAINE-EPINEPHRINE 1 %-1:100000 IJ SOLN
INTRAMUSCULAR | Status: DC | PRN
  Administered 2012-12-23: 20 mL

## 2012-12-23 MED ORDER — ONDANSETRON HCL 4 MG/2ML IJ SOLN
INTRAMUSCULAR | Status: DC | PRN
  Administered 2012-12-23: 4 mg via INTRAVENOUS

## 2012-12-23 MED ORDER — UNJURY CHICKEN SOUP POWDER: 2.0000 [oz_av] | Freq: Four times a day (QID) | ORAL | Status: DC

## 2012-12-23 MED ORDER — FENTANYL CITRATE 0.05 MG/ML IJ SOLN
INTRAMUSCULAR | Status: DC | PRN
  Administered 2012-12-23 (×2): 50 ug via INTRAVENOUS

## 2012-12-23 MED ORDER — DEXAMETHASONE SODIUM PHOSPHATE 4 MG/ML IJ SOLN
INTRAMUSCULAR | Status: DC | PRN
  Administered 2012-12-23: 10 mg via INTRAVENOUS

## 2012-12-23 MED ORDER — HYDROMORPHONE HCL PF 1 MG/ML IJ SOLN
INTRAMUSCULAR | Status: DC | PRN
  Administered 2012-12-23 (×2): 1 mg via INTRAVENOUS

## 2012-12-23 MED ORDER — HYDROMORPHONE HCL PF 1 MG/ML IJ SOLN
0.2500 mg | INTRAMUSCULAR | Status: DC | PRN
  Administered 2012-12-23: 0.5 mg via INTRAVENOUS

## 2012-12-23 MED ORDER — SODIUM CHLORIDE 0.9 % IV SOLN
1.0000 g | Freq: Once | INTRAVENOUS | Status: AC
  Administered 2012-12-23: 1 g via INTRAVENOUS
  Filled 2012-12-23: qty 1

## 2012-12-23 MED ORDER — SCOPOLAMINE 1 MG/3DAYS TD PT72
MEDICATED_PATCH | TRANSDERMAL | Status: AC
  Filled 2012-12-23: qty 1

## 2012-12-23 MED ORDER — MORPHINE SULFATE 2 MG/ML IJ SOLN
2.0000 mg | INTRAMUSCULAR | Status: DC | PRN
  Administered 2012-12-23 – 2012-12-24 (×5): 4 mg via INTRAVENOUS
  Filled 2012-12-23 (×5): qty 2

## 2012-12-23 MED ORDER — KETAMINE HCL 10 MG/ML IJ SOLN
INTRAMUSCULAR | Status: DC | PRN
  Administered 2012-12-23: 150 mg via INTRAVENOUS
  Administered 2012-12-23: 20 mg via INTRAVENOUS

## 2012-12-23 MED ORDER — GLYCOPYRROLATE 0.2 MG/ML IJ SOLN
INTRAMUSCULAR | Status: DC | PRN
  Administered 2012-12-23: 0.6 mg via INTRAVENOUS
  Administered 2012-12-23: 0.1 mg via INTRAVENOUS

## 2012-12-23 MED ORDER — NEOSTIGMINE METHYLSULFATE 1 MG/ML IJ SOLN
INTRAMUSCULAR | Status: DC | PRN
  Administered 2012-12-23: 5 mg via INTRAVENOUS

## 2012-12-23 MED ORDER — UNJURY CHOCOLATE CLASSIC POWDER: 2.0000 [oz_av] | Freq: Four times a day (QID) | ORAL | Status: DC

## 2012-12-23 MED ORDER — TISSEEL VH 10 ML EX KIT
PACK | CUTANEOUS | Status: AC
  Filled 2012-12-23: qty 2

## 2012-12-23 MED ORDER — PROPOFOL 10 MG/ML IV BOLUS
INTRAVENOUS | Status: DC | PRN
  Administered 2012-12-23: 200 mg via INTRAVENOUS

## 2012-12-23 MED ORDER — LIDOCAINE-EPINEPHRINE 1 %-1:100000 IJ SOLN
INTRAMUSCULAR | Status: AC
  Filled 2012-12-23: qty 1

## 2012-12-23 MED ORDER — PHENYLEPHRINE HCL 10 MG/ML IJ SOLN
INTRAMUSCULAR | Status: DC | PRN
  Administered 2012-12-23 (×2): 80 ug via INTRAVENOUS

## 2012-12-23 MED ORDER — BUPIVACAINE HCL (PF) 0.25 % IJ SOLN
INTRAMUSCULAR | Status: AC
  Filled 2012-12-23: qty 30

## 2012-12-23 MED ORDER — SCOPOLAMINE 1 MG/3DAYS TD PT72
MEDICATED_PATCH | TRANSDERMAL | Status: DC | PRN
  Administered 2012-12-23: 1 via TRANSDERMAL

## 2012-12-23 MED ORDER — LACTATED RINGERS IR SOLN
Status: DC | PRN
  Administered 2012-12-23: 1000 mL

## 2012-12-23 MED ORDER — KCL IN DEXTROSE-NACL 20-5-0.9 MEQ/L-%-% IV SOLN
INTRAVENOUS | Status: DC
  Administered 2012-12-23 – 2012-12-25 (×4): via INTRAVENOUS
  Filled 2012-12-23 (×8): qty 1000

## 2012-12-23 MED ORDER — BUPIVACAINE HCL 0.25 % IJ SOLN
INTRAMUSCULAR | Status: DC | PRN
  Administered 2012-12-23: 20 mL

## 2012-12-23 MED ORDER — SUCCINYLCHOLINE CHLORIDE 20 MG/ML IJ SOLN
INTRAMUSCULAR | Status: DC | PRN
  Administered 2012-12-23: 100 mg via INTRAVENOUS

## 2012-12-23 MED ORDER — ACETAMINOPHEN 160 MG/5ML PO SOLN: 650.0000 mg | ORAL | Status: DC | PRN

## 2012-12-23 MED ORDER — HEPARIN SODIUM (PORCINE) 5000 UNIT/ML IJ SOLN
5000.0000 [IU] | Freq: Once | INTRAMUSCULAR | Status: AC
  Administered 2012-12-23: 5000 [IU] via SUBCUTANEOUS
  Filled 2012-12-23: qty 1

## 2012-12-23 MED ORDER — EPHEDRINE SULFATE 50 MG/ML IJ SOLN
INTRAMUSCULAR | Status: DC | PRN
  Administered 2012-12-23: 10 mg via INTRAVENOUS
  Administered 2012-12-23 (×3): 5 mg via INTRAVENOUS
  Administered 2012-12-23: 10 mg via INTRAVENOUS

## 2012-12-23 MED ORDER — ENOXAPARIN SODIUM 40 MG/0.4ML ~~LOC~~ SOLN
40.0000 mg | Freq: Two times a day (BID) | SUBCUTANEOUS | Status: DC
  Administered 2012-12-24 – 2012-12-25 (×3): 40 mg via SUBCUTANEOUS
  Filled 2012-12-23 (×6): qty 0.4

## 2012-12-23 MED ORDER — SODIUM CHLORIDE 0.9 % IV SOLN
INTRAVENOUS | Status: AC
  Filled 2012-12-23: qty 1

## 2012-12-23 MED ORDER — HYDROMORPHONE HCL PF 1 MG/ML IJ SOLN
INTRAMUSCULAR | Status: AC
  Filled 2012-12-23: qty 1

## 2012-12-23 MED ORDER — CISATRACURIUM BESYLATE (PF) 10 MG/5ML IV SOLN
INTRAVENOUS | Status: DC | PRN
  Administered 2012-12-23: 4 mg via INTRAVENOUS
  Administered 2012-12-23: 10 mg via INTRAVENOUS
  Administered 2012-12-23 (×2): 4 mg via INTRAVENOUS
  Administered 2012-12-23: 2 mg via INTRAVENOUS

## 2012-12-23 MED ORDER — UNJURY VANILLA POWDER
2.0000 [oz_av] | Freq: Four times a day (QID) | ORAL | Status: DC
  Administered 2012-12-25: 2 [oz_av] via ORAL

## 2012-12-23 MED ORDER — ONDANSETRON HCL 4 MG/2ML IJ SOLN
4.0000 mg | INTRAMUSCULAR | Status: DC | PRN
  Administered 2012-12-23 – 2012-12-25 (×4): 4 mg via INTRAVENOUS
  Filled 2012-12-23 (×4): qty 2

## 2012-12-23 MED ORDER — MIDAZOLAM HCL 5 MG/5ML IJ SOLN
INTRAMUSCULAR | Status: DC | PRN
  Administered 2012-12-23: 2 mg via INTRAVENOUS

## 2012-12-23 MED ORDER — LACTATED RINGERS IV SOLN: INTRAVENOUS | Status: DC

## 2012-12-23 MED ORDER — LACTATED RINGERS IV SOLN
INTRAVENOUS | Status: DC | PRN
  Administered 2012-12-23 (×3): via INTRAVENOUS

## 2012-12-23 MED ORDER — METOCLOPRAMIDE HCL 5 MG/ML IJ SOLN
INTRAMUSCULAR | Status: DC | PRN
  Administered 2012-12-23: 10 mg via INTRAVENOUS

## 2012-12-23 SURGICAL SUPPLY — 69 items
APL SRG 32X5 SNPLK LF DISP (MISCELLANEOUS) ×2
APPLIER CLIP ROT 13.4 12 LRG (CLIP)
APPLIER CLIP UNV 5X34 EPIX (ENDOMECHANICALS) IMPLANT
APR CLP LRG 13.4X12 ROT 20 MLT (CLIP)
APR XCLPCLP 20M/L UNV 34X5 (ENDOMECHANICALS)
BAG SPEC RTRVL LRG 6X4 10 (ENDOMECHANICALS) ×2
BLADE SURG 15 STRL LF DISP TIS (BLADE) ×2 IMPLANT
BLADE SURG 15 STRL SS (BLADE) ×3
CABLE HIGH FREQUENCY MONO STRZ (ELECTRODE) ×1 IMPLANT
CANISTER SUCTION 2500CC (MISCELLANEOUS) ×3 IMPLANT
CHLORAPREP W/TINT 26ML (MISCELLANEOUS) ×6 IMPLANT
CLIP APPLIE ROT 13.4 12 LRG (CLIP) IMPLANT
COVER PROBE U/S 5X48 (MISCELLANEOUS) ×3 IMPLANT
DEVICE SUTURE ENDOST 10MM (ENDOMECHANICALS) ×3 IMPLANT
DISSECTOR BLUNT TIP ENDO 5MM (MISCELLANEOUS) IMPLANT
DRAIN CHANNEL 19F RND (DRAIN) ×3 IMPLANT
DRAPE CAMERA CLOSED 9X96 (DRAPES) ×3 IMPLANT
EVACUATOR SILICONE 100CC (DRAIN) ×3 IMPLANT
GAUZE SPONGE 4X4 16PLY XRAY LF (GAUZE/BANDAGES/DRESSINGS) ×3 IMPLANT
GLOVE SURG SS PI 7.5 STRL IVOR (GLOVE) ×6 IMPLANT
GOWN STRL REIN XL XLG (GOWN DISPOSABLE) ×12 IMPLANT
HOVERMATT SINGLE USE (MISCELLANEOUS) ×3 IMPLANT
KIT BASIN OR (CUSTOM PROCEDURE TRAY) ×3 IMPLANT
KIT GASTRIC LAVAGE 34FR ADT (SET/KITS/TRAYS/PACK) ×3 IMPLANT
MARKER SKIN DUAL TIP RULER LAB (MISCELLANEOUS) ×3 IMPLANT
NDL SPNL 22GX3.5 QUINCKE BK (NEEDLE) ×2 IMPLANT
NEEDLE SPNL 22GX3.5 QUINCKE BK (NEEDLE) ×3 IMPLANT
NS IRRIG 1000ML POUR BTL (IV SOLUTION) ×3 IMPLANT
PACK CARDIOVASCULAR III (CUSTOM PROCEDURE TRAY) ×3 IMPLANT
PENCIL BUTTON HOLSTER BLD 10FT (ELECTRODE) ×3 IMPLANT
POUCH SPECIMEN RETRIEVAL 10MM (ENDOMECHANICALS) ×3 IMPLANT
RELOAD BLUE (STAPLE) ×3 IMPLANT
RELOAD ENDO STITCH (ENDOMECHANICALS) ×18 IMPLANT
RELOAD ENDO STITCH 2.0 (ENDOMECHANICALS)
RELOAD GOLD (STAPLE) ×8 IMPLANT
RELOAD SUT SNGL STCH ABSRB 2-0 (ENDOMECHANICALS) IMPLANT
RELOAD SUT SNGL STCH BLK 2-0 (ENDOMECHANICALS) IMPLANT
RELOAD SUT TRIPLE-STITCH 2-0 (ENDOMECHANICALS) ×6 IMPLANT
RELOAD WHITE ECR60W (STAPLE) ×8 IMPLANT
SCISSORS LAP 5X45 EPIX DISP (ENDOMECHANICALS) ×1 IMPLANT
SEALANT SURGICAL APPL DUAL CAN (MISCELLANEOUS) ×1 IMPLANT
SET IRRIG TUBING LAPAROSCOPIC (IRRIGATION / IRRIGATOR) ×3 IMPLANT
SHEARS CURVED HARMONIC AC 45CM (MISCELLANEOUS) ×3 IMPLANT
SOLUTION ANTI FOG 6CC (MISCELLANEOUS) ×3 IMPLANT
SPONGE GAUZE 4X4 12PLY (GAUZE/BANDAGES/DRESSINGS) IMPLANT
STAPLE ECHEON FLEX 60 POW ENDO (STAPLE) ×4 IMPLANT
STAPLER CIRC 21 3.5 SULU (STAPLE) ×3 IMPLANT
STAPLER TRANS-ORAL 21MM DST (STAPLE) ×3 IMPLANT
STAPLER VISISTAT 35W (STAPLE) ×3 IMPLANT
STRIP PERI DRY VERITAS 45 (STAPLE) ×1 IMPLANT
STRIP PERI DRY VERITAS 60 (STAPLE) ×6 IMPLANT
SUT ETHILON 3 0 PS 1 (SUTURE) ×3 IMPLANT
SUT MNCRL AB 4-0 PS2 18 (SUTURE) ×3 IMPLANT
SUT RELOAD ENDO STITCH 2 48X1 (ENDOMECHANICALS)
SUT RELOAD ENDO STITCH 2.0 (ENDOMECHANICALS)
SUT VICRYL 0 UR6 27IN ABS (SUTURE) ×6 IMPLANT
SUTURE RELOAD END STTCH 2 48X1 (ENDOMECHANICALS) IMPLANT
SUTURE RELOAD ENDO STITCH 2.0 (ENDOMECHANICALS) IMPLANT
SYR 20CC LL (SYRINGE) ×6 IMPLANT
SYR 50ML LL SCALE MARK (SYRINGE) ×3 IMPLANT
TOWEL OR 17X26 10 PK STRL BLUE (TOWEL DISPOSABLE) ×3 IMPLANT
TRAY FOLEY CATH 14FRSI W/METER (CATHETERS) ×3 IMPLANT
TROCAR BLADELESS 15MM (ENDOMECHANICALS) ×3 IMPLANT
TROCAR BLADELESS OPT 5 100 (ENDOMECHANICALS) ×3 IMPLANT
TROCAR XCEL NON-BLD 5MMX100MML (ENDOMECHANICALS) ×6 IMPLANT
TUBING CONNECTING 10 (TUBING) ×3 IMPLANT
TUBING ENDO SMARTCAP (MISCELLANEOUS) ×3 IMPLANT
TUBING FILTER THERMOFLATOR (ELECTROSURGICAL) ×1 IMPLANT
TUBING IRRIGATION ENDOGATOR (MISCELLANEOUS) ×1 IMPLANT

## 2012-12-23 NOTE — Brief Op Note (Signed)
12/23/2012  10:31 AM  PATIENT:  [name]  47 y.o. female  PRE-OPERATIVE DIAGNOSIS:  morbid obesity   POST-OPERATIVE DIAGNOSIS:  morbid obesity   PROCEDURE:  Procedure(s): LAPAROSCOPIC ROUX-EN-Y GASTRIC BYPASS WITH UPPER ENDOSCOPY (N/A) UPPER GI ENDOSCOPY (N/A)  SURGEON:  Surgeon(s) and Role:    * Lodema Pilot, DO - Primary  PHYSICIAN ASSISTANT:   ASSISTANTS: [place]   ANESTHESIA:   general  EBL:  Total I/O In: 2000 [I.V.:2000] Out: 450 [Urine:400; Blood:50]  BLOOD ADMINISTERED:none  DRAINS: (52F) Jackson-Pratt drain(s) with closed bulb suction in the GJ junction   LOCAL MEDICATIONS USED:  MARCAINE    and LIDOCAINE   SPECIMEN:  No Specimen  DISPOSITION OF SPECIMEN:  N/A  COUNTS:  YES  TOURNIQUET:  * No tourniquets in log *  DICTATION: .Other Dictation: Dictation Number [id number]  PLAN OF CARE: Admit to inpatient   PATIENT DISPOSITION:  PACU - hemodynamically stable.   Delay start of Pharmacological VTE agent (>24hrs) due to surgical blood loss or risk of bleeding: no

## 2012-12-23 NOTE — Progress Notes (Signed)
She looks fine.  Not much discomfort.  Vitals okay

## 2012-12-23 NOTE — Anesthesia Postprocedure Evaluation (Signed)
  Anesthesia Post-op Note  Patient: [name]  Procedure(s) Performed: Procedure(s) (LRB): LAPAROSCOPIC ROUX-EN-Y GASTRIC BYPASS WITH UPPER ENDOSCOPY (N/A) UPPER GI ENDOSCOPY (N/A)  Patient Location: PACU  Anesthesia Type: General  Level of Consciousness: awake and alert   Airway and Oxygen Therapy: Patient Spontanous Breathing  Post-op Pain: mild  Post-op Assessment: Post-op Vital signs reviewed, Patient's Cardiovascular Status Stable, Respiratory Function Stable, Patent Airway and No signs of Nausea or vomiting  Last Vitals:  Filed Vitals:   12/23/12 1100  BP: 135/55  Pulse: 89  Temp: 37.2 C  Resp: 19    Post-op Vital Signs: stable   Complications: No apparent anesthesia complications

## 2012-12-23 NOTE — Op Note (Signed)
Upper GI endoscopy is performed at the completion of laparoscopic Roux-en-Y gastric bypass by Dr. Biagio Quint. The Olympus video endoscope was inserted into the upper esophagus and then passed under direct vision to the EG junction. The small gastric pouch was insufflated with air while the gastric outlet was clamped under irrigation by the operating surgeon. There was no evidence of leak. The anastomosis was visualized and was patent. Suture and staple lines were intact and without bleeding. The pouch was tubular and measured 4-5 cm in length. At the completion of the procedure the pouch was desufflated and the scope withdrawn.

## 2012-12-23 NOTE — Anesthesia Preprocedure Evaluation (Addendum)
Anesthesia Evaluation  Patient identified by MRN, date of birth, ID band Patient awake    Reviewed: Allergy & Precautions, H&P , NPO status , Patient's Chart, lab work & pertinent test results  History of Anesthesia Complications (+) PONV  Airway Mallampati: III TM Distance: >3 FB Neck ROM: full    Dental  (+) Caps and Dental Advisory Given All upper front teeth are veneers:   Pulmonary sleep apnea and Continuous Positive Airway Pressure Ventilation ,  breath sounds clear to auscultation  Pulmonary exam normal       Cardiovascular Exercise Tolerance: Good hypertension, Pt. on medications Rhythm:regular Rate:Normal     Neuro/Psych C6 radiculopathy negative neurological ROS  negative psych ROS   GI/Hepatic negative GI ROS, Neg liver ROS, GERD-  Controlled and Medicated,  Endo/Other  negative endocrine ROSMorbid obesity  Renal/GU negative Renal ROS  negative genitourinary   Musculoskeletal   Abdominal (+) + obese,   Peds  Hematology negative hematology ROS (+)   Anesthesia Other Findings   Reproductive/Obstetrics negative OB ROS                          Anesthesia Physical Anesthesia Plan  ASA: III  Anesthesia Plan: General   Post-op Pain Management:    Induction: Intravenous  Airway Management Planned: Oral ETT  Additional Equipment:   Intra-op Plan:   Post-operative Plan: Extubation in OR  Informed Consent: I have reviewed the patients History and Physical, chart, labs and discussed the procedure including the risks, benefits and alternatives for the proposed anesthesia with the patient or authorized representative who has indicated his/her understanding and acceptance.   Dental Advisory Given  Plan Discussed with: CRNA and Surgeon  Anesthesia Plan Comments:         Anesthesia Quick Evaluation

## 2012-12-23 NOTE — Preoperative (Signed)
Beta Blockers   Reason not to administer Beta Blockers:Not Applicable, not on home BB 

## 2012-12-23 NOTE — Transfer of Care (Signed)
Immediate Anesthesia Transfer of Care Note  Patient: [name]  Procedure(s) Performed: Procedure(s): LAPAROSCOPIC ROUX-EN-Y GASTRIC BYPASS WITH UPPER ENDOSCOPY (N/A) UPPER GI ENDOSCOPY (N/A)  Patient Location: PACU  Anesthesia Type:General  Level of Consciousness: Patient easily awoken, sedated, comfortable, cooperative, following commands, responds to stimulation.   Airway & Oxygen Therapy: Patient spontaneously breathing, ventilating well, oxygen via simple oxygen mask.  Post-op Assessment: Report given to PACU RN, vital signs reviewed and stable, moving all extremities.   Post vital signs: Reviewed and stable.  Complications: No apparent anesthesia complications

## 2012-12-23 NOTE — H&P (View-Only) (Signed)
Patient ID: [name], female   DOB: Sep 15, 1965, 47 y.o.   MRN: [id number]  No chief complaint on file.   HPI [name] is a 47 y.o. female.  Patient comes in today for her preoperative surgery evaluation in preparation for Roux-en-Y gastric bypass. She has a BMI of 56 with her obesity related comorbidities of hypertension, GERD, obstructive sleep apnea and hyperlipidemia. She has completed her preoperative surgery evaluation except for the echocardiogram. This was denied by her insurance. She denies any heart problems and says that she has a good exercise tolerance. She continues to have intermittent reflux it has decided on Roux-en-Y gastric bypass do to her reflux and in order to achieve maximal weight loss. HPI  Past Medical History  Diagnosis Date  . Hypertension   . Cystocele   . Rectocele     stress incontinence  . Dysfunctional uterine bleeding     resolved after  Hysterectomy(hx. fibroids)  . Anemia     Hgb 5 02/18/10, blood transfusion 02/18/10  . Depression   . PCOS (polycystic ovarian syndrome)   . Obesity, Class III, BMI 40-49.9 (morbid obesity)   . Cervical radiculopathy at C6 01/25/2011  . Lateral epicondylitis  of elbow 02/01/2011  . Morbid obesity   . OSA on CPAP     cpap used nightly, settings 13  . PONV (postoperative nausea and vomiting)   . Acid reflux 07/22/2012    much improved with Bariatric diet    Past Surgical History  Procedure Laterality Date  . Vaginal delivery  1993  . Abdominal hysterectomy    . Cholecystectomy  1993    laparoscopic    Family History  Problem Relation Age of Onset  . Colon cancer Maternal Uncle   . Hyperlipidemia Mother   . Hypertension Mother   . Hyperlipidemia Father   . Hypertension Father   . Heart attack Father 11    5-bypass surgery  . Heart disease Father   . Diabetes Maternal Aunt   . Cancer Paternal Uncle     colon    Social History History  Substance Use Topics  . Smoking status: Former Smoker   Types: Cigarettes    Quit date: 02/14/1995  . Smokeless tobacco: Never Used  . Alcohol Use: No    No Known Allergies  Current Outpatient Prescriptions  Medication Sig Dispense Refill  . cyanocobalamin 500 MCG tablet Take 500 mcg by mouth daily.      Marland Kitchen estradiol (ESTRACE) 1 MG tablet Take 1 mg by mouth daily as needed.      . hydrochlorothiazide (HYDRODIURIL) 25 MG tablet Take 25 mg by mouth daily.      Marland Kitchen lisinopril (PRINIVIL,ZESTRIL) 10 MG tablet Take 10 mg by mouth daily. 12-16-12 Lisinopril dose decreased to 5mg   Orally  Daily.      Marland Kitchen omeprazole (PRILOSEC) 20 MG capsule Take 20 mg by mouth daily as needed (heart burn).      Marland Kitchen oxybutynin (DITROPAN) 5 MG tablet Take 5 mg by mouth daily as needed (urinary).      . pantoprazole (PROTONIX) 40 MG tablet Take 40 mg by mouth daily as needed (heartburn).       No current facility-administered medications for this visit.    Review of Systems Review of Systems All other review of systems negative or noncontributory except as stated in the HPI  Last menstrual period 03/21/2010.  Physical Exam Physical Exam Physical Exam  Nursing note and vitals reviewed. Constitutional: She is oriented  to person, place, and time. She appears well-developed and well-nourished. No distress.  HENT:  Head: Normocephalic and atraumatic.  Mouth/Throat: No oropharyngeal exudate.  Eyes: Conjunctivae and EOM are normal. Pupils are equal, round, and reactive to light. Right eye exhibits no discharge. Left eye exhibits no discharge. No scleral icterus.  Neck: Normal range of motion. Neck supple. No tracheal deviation present.  Cardiovascular: Normal rate, regular rhythm, normal heart sounds and intact distal pulses.   Pulmonary/Chest: Effort normal and breath sounds normal. No stridor. No respiratory distress. She has no wheezes.  Abdominal: Soft. Bowel sounds are normal. She exhibits no distension and no mass. There is no tenderness. There is no rebound and no  guarding.  Musculoskeletal: Normal range of motion. She exhibits no edema and no tenderness.  Neurological: She is alert and oriented to person, place, and time.  Skin: Skin is warm and dry. No rash noted. She is not diaphoretic. No erythema. No pallor.  Psychiatric: She has a normal mood and affect. Her behavior is normal. Judgment and thought content normal.    Data Reviewed   Assessment    Morbid obesity with a BMI of 56 and obesity related comorbidities of hypertension, hyperlipidemia, GERD, and obstructive sleep apnea We again discussed the options for weight loss and she remains interested in Roux-en-Y gastric bypass. We discussed the procedure and its risks in the perioperative course. The risks of infection, bleeding, pain, scarring, weight regain, too little or too much weight loss, vitamin deficiencies and need for lifelong vitamin supplementation, hair loss, need for protein supplementation, leaks, stricture, reflux, food intolerance, need for reoperation , need for open surgery, injury to spleen or surrounding structures, DVT's, PE, and death again discussed with the patient and the patient expressed understanding and desires to proceed with laparoscopic RYGB, possible open, intraoperative endoscopy.  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 We have her set up for this tomorrow and if this is significantly abnormal we will need to hold off on her procedure but otherwise we will proceed with Roux-en-Y gastric bypass on Monday.       Plan    we will plan for Roux-en-Y gastric bypass on Monday and plan for echocardiogram tomorrow.         Lodema Pilot DAVID 12/19/2012, 4:48 PM

## 2012-12-23 NOTE — Interval H&P Note (Signed)
History and Physical Interval Note:  12/23/2012 7:16 AM  [name]  has presented today for surgery, with the diagnosis of morbid obesity   The various methods of treatment have been discussed with the patient and family. After consideration of risks, benefits and other options for treatment, the patient has consented to  Procedure(s): LAPAROSCOPIC ROUX-EN-Y GASTRIC BYPASS WITH UPPER ENDOSCOPY (N/A) as a surgical intervention .  The patient's history has been reviewed, patient examined, no change in status, stable for surgery.  I have reviewed the patient's chart and labs.  Questions were answered to the patient's satisfaction. I have seen and evaluated the patient.  Her echo was okay.  She denies any changes from prior exam.  We again discussed the options for wt. Loss including sleeve and RYGB and she remains interested in RYGB.  We discussed the pros and cons and risks of the procedure.  The risks of infection, bleeding, pain, scarring, weight regain, too little or too much weight loss, vitamin deficiencies and need for lifelong vitamin supplementation, hair loss, need for protein supplementation, leaks, stricture, reflux, food intolerance, need for reoperation , need for open surgery, injury to spleen or surrounding structures, DVT's, PE, and death again discussed with the patient and the patient expressed understanding and desires to proceed with laparoscopic RYGB, possible open, intraoperative endoscopy.      Lodema Pilot DAVID

## 2012-12-24 ENCOUNTER — Encounter (HOSPITAL_COMMUNITY): Payer: Self-pay | Admitting: General Surgery

## 2012-12-24 LAB — COMPREHENSIVE METABOLIC PANEL
ALT: 99 U/L — ABNORMAL HIGH (ref 0–35)
AST: 81 U/L — ABNORMAL HIGH (ref 0–37)
Alkaline Phosphatase: 52 U/L (ref 39–117)
CO2: 30 mEq/L (ref 19–32)
Chloride: 101 mEq/L (ref 96–112)
Creatinine, Ser: 0.92 mg/dL (ref 0.50–1.10)
GFR calc Af Amer: 85 mL/min — ABNORMAL LOW (ref >90)
GFR calc non Af Amer: 73 mL/min — ABNORMAL LOW (ref >90)
Glucose, Bld: 133 mg/dL — ABNORMAL HIGH (ref 70–99)
Potassium: 5.1 mEq/L (ref 3.5–5.1)
Sodium: 137 mEq/L (ref 135–145)
Total Bilirubin: 0.4 mg/dL (ref 0.3–1.2)

## 2012-12-24 LAB — CBC WITH DIFFERENTIAL/PLATELET
Basophils Absolute: 0 10*3/uL (ref 0.0–0.1)
Lymphocytes Relative: 8 % — ABNORMAL LOW (ref 12–46)
Lymphs Abs: 1.2 10*3/uL (ref 0.7–4.0)
Neutro Abs: 12.2 10*3/uL — ABNORMAL HIGH (ref 1.7–7.7)
Neutrophils Relative %: 81 % — ABNORMAL HIGH (ref 43–77)
Platelets: 253 10*3/uL (ref 150–400)
RBC: 4.28 MIL/uL (ref 3.87–5.11)
RDW: 13.1 % (ref 11.5–15.5)
WBC: 15 10*3/uL — ABNORMAL HIGH (ref 4.0–10.5)

## 2012-12-24 MED ORDER — OXYCODONE HCL 5 MG/5ML PO SOLN
5.0000 mg | ORAL | Status: DC | PRN
  Administered 2012-12-24 – 2012-12-25 (×3): 10 mg via ORAL
  Administered 2012-12-25: 5 mg via ORAL
  Filled 2012-12-24 (×2): qty 10
  Filled 2012-12-24: qty 5
  Filled 2012-12-24: qty 10

## 2012-12-24 NOTE — Care Management Note (Signed)
    Page 1 of 1   12/24/2012     11:45:23 AM   CARE MANAGEMENT NOTE 12/24/2012  Patient:  [name], [name]   Account Number:  [id number]  Date Initiated:  12/24/2012  Documentation initiated by:  Lorenda Ishihara  Subjective/Objective Assessment:   47 yo female admitted s/p lap gastric bypass. PTA lived at home with spouse.     Action/Plan:   Home when stable   Anticipated DC Date:  12/24/2012   Anticipated DC Plan:  HOME/SELF CARE      DC Planning Services  CM consult      Choice offered to / List presented to:             Status of service:  Completed, signed off Medicare Important Message given?   (If response is "NO", the following Medicare IM given date fields will be blank) Date Medicare IM given:   Date Additional Medicare IM given:    Discharge Disposition:  HOME/SELF CARE  Per UR Regulation:  Reviewed for med. necessity/level of care/duration of stay  If discussed at Long Length of Stay Meetings, dates discussed:    Comments:

## 2012-12-24 NOTE — Op Note (Signed)
[name], [name] NO.:  [id number]  MEDICAL RECORD NO.:  [id number]  LOCATION:  1524                         FACILITY:  [facility]  PHYSICIAN:  Lodema Pilot, MD       DATE OF BIRTH:  1965-04-22  DATE OF PROCEDURE:  12/23/2012 DATE OF DISCHARGE:                              OPERATIVE REPORT   PROCEDURE:  Laparoscopic Roux-en-Y gastric bypass with intraoperative endoscopy.  PREOPERATIVE DIAGNOSES:  Morbid obesity.  POSTOPERATIVE DIAGNOSIS:  Morbid obesity.  SURGEON:  Lodema Pilot, MD.  ASSISTANT:  Dr. Johna Sheriff.  ANESTHESIA:  General endotracheal anesthesia and 0.25% Marcaine and 1% lidocaine with epinephrine in a 50:50 mixture.  FLUIDS:  2300 mL of crystalloid.  ESTIMATED BLOOD LOSS:  50 mL.  DRAINS:  A 19-French Blake drain placed posterior to the gastrojejunal anastomosis.  SPECIMENS:  None.  COMPLICATIONS:  None.  FINDINGS:  Linear stapled side-to-side, functional end-to-end anastomosis for the jejunojejunal anastomosis.  Mesenteric defect was approximated.  A 21 mm EEA circular anastomosis was created with the gastrojejunal anastomosis.  Leak test is negative.  Drain is posterior to the gastrojejunal anastomosis.  INDICATION FOR PROCEDURE:  [name] is a 47 year old female with a BMI of 56 and obesity-related comorbidities hypertension, GERD, obstructive sleep apnea, and hyperlipidemia, who has failed medical weight loss attempts and desires durable weight loss solution.  DESCRIPTION OF PROCEDURE:  [name] was seen and evaluated in the preoperative area.  Risks and benefits of procedure were again discussed in lay terms.  Informed consent was obtained.  She was taken to the operating room, placed on the table in supine position, and general endotracheal anesthesia was obtained.  Foley catheter was placed, and prophylactic antibiotics were given and subcutaneous heparin was administered.  Her abdomen was prepped and draped in standard  surgical fashion.  Procedure time-out was performed with all the operative team members to confirm proper patient and procedure.  A 5 mm Optiview trocar was used to access the peritoneum in the left upper quadrant. Pneumoperitoneum was obtained.  Laparoscope was introduced.  There was no evidence of bleeding or bowel injury or bladder injury.  After I insufflated the abdomen, she was noted to have some omental adhesions to the lower midline under her hysterectomy scar.  She also had a moderate- sized incisional hernia in the area as well as a small umbilical hernia. I placed a 5 mm left rectus port, a 15 mm right rectus port, and a 5 mm right upper quadrant port, all under direct visualization.  There was noted to be only omentum adhered to the abdominal Henkes, and this was taken down from the abdominal Steagall with Harmonic scalpel.  This allowed me to freely mobilize and elevate the omentum up over the liver, and the ligament of Treitz was identified in its usual anatomic position.  I measured 50 cm from the ligament of Treitz and took this loop of small intestine up towards the diaphragm, and it appeared to reach without tension.  I divided the small intestine at this point, 50 cm from the ligament of Treitz with a transverse firing of a 60 mm white staple load.  We then  divided the mesentery down to its base with the Harmonic scalpel, taking care to avoid undermining of the proximal limb of the bowel.  I measured out 100 cm of the Roux limb and made 2 enterotomies on the antimesenteric surface of the bowel.  A side-to-side stapled anastomosis was created with a 60 mm firing of the white load of the Endo-GIA, and the common enterotomy was approximated with a #2-0 running Lembert Surgidac suture.  While tying my knots after closing the enterotomy, the endo-stitch suture had broken, so I completed the knots manually and applied Lapra-Tys to the tails of the suture to add security.  A crotch  stitch was placed with the Surgidac suture, and an alignment stitch was placed as well, aligning the Roux limb with the BP limb, and the mesenteric defect was closed with #2-0 Surgidac sutures, and this was tied to the alignment stitch.  The anastomosis appeared well closed and widely patent.  I then divided the greater omentum up to the transverse colon and placed a Nathanson liver retractor through the epigastrium through a separate stab incision.  I divided the pars flaccida and attempted to enter the lesser sac, but my usual window did not open up into the lesser sac, so I performed more of a perigastric approach to the lesser sac.  I measured out 5 cm from the GE junction and created a retrogastric tunnel, and once I entered the lesser sac I fired transversely for about 50 cm under the stomach with a gold staple load without any Peri-Strips.  I took down the angle of His and then dissected posteriorly along the stomach, cephalad to the short gastric vessels, and a window was created through the angle of His.  The lateral Oliveira of the stomach pouch was created with 2 firings of the gold stapler covered with Peri-Strips, and the stomach was completely transected.  We then created a small gastrotomy through the uncovered portion of the distal staple line, and the CRNA passed a 21 mm OrVil anvil through the mouth of esophagus and through my gastrotomy.  I removed the tubing and passed off the tube into the garbage and changed my gloves for fresh gloves.  A 2-0 Surgidac suture was used to tighten up the gastric pouch around the anvil, and the Roux limb was brought up which appeared to reach without any significant tension.  I had undermined a little bit of the candy-cane portion of the Roux limbs, and although it had pinked up okay, I made my enterotomies further in on the Roux limb to ensure adequate blood flow to the anastomosis and exited the spike on the antimesenteric surface of the  bowel.  Spike was mated with the anvil, and anastomosis was created with a 21 mm 3.5 mm staple height EEA stapler.  The donuts were inspected on the back table and noted to be circumferential, and the candy-cane portion of the Roux limb was undermined, and the enterotomy was closed by resecting the candy-cane portion of the Roux limb flush with the gastrojejunal anastomosis.  This portion of bowel was placed in an EndoCatch bag and removed but not sent to Pathology for permanent section.  Anti-tension stitches were placed on each side of the anastomosis, and Dr. Johna Sheriff performed upper endoscopy after I had clamped the Roux limb with bowel clamps and filled the abdomen with sterile saline solution.  He drove the scope into the gastric pouch, and the anastomosis appeared to be hemostatic and widely patent.  I  was able to easily navigate into the Roux limb and while he had this insufflated with air and had the bowel clamped, there was no evidence of air bubbles or leakage.  The pouch appeared to be a good small gastric pouch, about 5 cm in length, and the air was suctioned and the scope was removed.  A 19-French Blake drain was placed into the abdomen and placed posterior to the gastrojejunal anastomosis and exited through the left upper quadrant trocar site, sutured in place with a nylon drain stitch.  Liver retractor was removed, and the JJ anastomosis was inspected again, and there was no evidence of leakage.  Her abdominal Robicheaux incisional hernia was wide, and we do not feel that this would be a very high risk for strangulation, and we have planned for later repair of this hernia as well as her umbilical hernia.  The rectus incision was closed with interrupted 0 Vicryl sutures in open fashion. The sutures were secured, and the abdomen was re-insufflated, and the abdominal Biddinger closure was noted be adequate without any evidence of a bowel injury.  Final trocars were removed under direct  visualization, and the remainder of the abdomen appeared to be hemostatic without any evidence of bowel injury or anastomotic leakage or bleeding.  The final trocars were removed, and the wounds were injected with a total of 40 mL of 1% lidocaine with epinephrine and 0.25% Marcaine in a 50:50 mixture. The rectus wound was irrigated, and skin edges were approximated with #4-0 Monocryl subcuticular sutures.  The skin was washed and dried, and Dermabond was applied.  All sponge, needle, and instrument counts were correct at the end of case, and the patient tolerated the procedure well without apparent complication.          ______________________________ Lodema Pilot, MD     BL/MEDQ  D:  12/23/2012  T:  12/24/2012  Job:  [id number]

## 2012-12-24 NOTE — Progress Notes (Signed)
1 Day Post-Op  Subjective: No issues overnight.  Minimal pain and no nausea  Objective: Vital signs in last 24 hours: Temp:  [98.4 F (36.9 C)-99.5 F (37.5 C)] 99 F (37.2 C) (11/11 0622) Pulse Rate:  [61-113] 61 (11/11 0622) Resp:  [14-25] 18 (11/11 0622) BP: (102-137)/(42-75) 106/66 mmHg (11/11 0622) SpO2:  [92 %-100 %] 100 % (11/11 0622)    Intake/Output from previous day: 11/10 0701 - 11/11 0700 In: 5016.7 [P.O.:300; I.V.:4716.7] Out: 535 [Urine:400; Drains:85; Blood:50] Intake/Output this shift:    General appearance: alert, cooperative and no distress Resp: clear to auscultation bilaterally Cardio: normal rate, regular GI: soft, mild incisional tenderness, ND, wounds okay, JP ss Extremities: SCD's bilat  Lab Results:   Recent Labs  12/24/12 0335  WBC 15.0*  HGB 13.5  HCT 39.9  PLT 253   BMET  Recent Labs  12/24/12 0335  NA 137  K 5.1  CL 101  CO2 30  GLUCOSE 133*  BUN 12  CREATININE 0.92  CALCIUM 9.1   PT/INR No results found for this basename: LABPROT, INR,  in the last 72 hours ABG No results found for this basename: PHART, PCO2, PO2, HCO3,  in the last 72 hours  Studies/Results: No results found.  Anti-infectives: Anti-infectives   Start     Dose/Rate Route Frequency Ordered Stop   12/23/12 0600  ertapenem (INVANZ) 1 g in sodium chloride 0.9 % 50 mL IVPB     1 g 100 mL/hr over 30 Minutes Intravenous  Once 12/23/12 0529 12/23/12 0739      Assessment/Plan: s/p Procedure(s): LAPAROSCOPIC ROUX-EN-Y GASTRIC BYPASS WITH UPPER ENDOSCOPY (N/A) UPPER GI ENDOSCOPY (N/A) seems to be doing okay.  plan to advance diet and mobilize  LOS: 1 day    Lodema Pilot DAVID 12/24/2012

## 2012-12-25 LAB — COMPREHENSIVE METABOLIC PANEL
AST: 82 U/L — ABNORMAL HIGH (ref 0–37)
Albumin: 3.1 g/dL — ABNORMAL LOW (ref 3.5–5.2)
Alkaline Phosphatase: 49 U/L (ref 39–117)
BUN: 9 mg/dL (ref 6–23)
CO2: 28 mEq/L (ref 19–32)
Chloride: 103 mEq/L (ref 96–112)
Creatinine, Ser: 0.82 mg/dL (ref 0.50–1.10)
GFR calc non Af Amer: 84 mL/min — ABNORMAL LOW (ref >90)
Glucose, Bld: 120 mg/dL — ABNORMAL HIGH (ref 70–99)
Potassium: 4.1 mEq/L (ref 3.5–5.1)
Total Bilirubin: 0.4 mg/dL (ref 0.3–1.2)

## 2012-12-25 LAB — CBC WITH DIFFERENTIAL/PLATELET
Basophils Absolute: 0 10*3/uL (ref 0.0–0.1)
Basophils Relative: 0 % (ref 0–1)
Eosinophils Absolute: 0.1 10*3/uL (ref 0.0–0.7)
Eosinophils Relative: 1 % (ref 0–5)
Hemoglobin: 13.4 g/dL (ref 12.0–15.0)
Lymphs Abs: 1.7 10*3/uL (ref 0.7–4.0)
MCH: 31.6 pg (ref 26.0–34.0)
MCHC: 33.4 g/dL (ref 30.0–36.0)
MCV: 94.6 fL (ref 78.0–100.0)
Neutrophils Relative %: 70 % (ref 43–77)
Platelets: 224 10*3/uL (ref 150–400)
RBC: 4.24 MIL/uL (ref 3.87–5.11)
RDW: 13.4 % (ref 11.5–15.5)

## 2012-12-25 MED ORDER — ONDANSETRON 4 MG PO TBDP: 4.0000 mg | ORAL_TABLET | Freq: Three times a day (TID) | ORAL | Status: DC | PRN

## 2012-12-25 MED ORDER — PANTOPRAZOLE SODIUM 40 MG PO TBEC: 40.0000 mg | DELAYED_RELEASE_TABLET | Freq: Every day | ORAL | Status: DC

## 2012-12-25 MED ORDER — OXYCODONE HCL 5 MG/5ML PO SOLN: 5.0000 mg | ORAL | Status: DC | PRN

## 2012-12-25 NOTE — Progress Notes (Signed)
HR normal.  Has not had any more nausea and is tolerating liquids.  She hasnt taken any pain meds since early this am.  She would like to go home and she should be okay for discharge.

## 2012-12-25 NOTE — Progress Notes (Signed)
2 Days Post-Op  Subjective: Had some nausea early this am.  Feeling better now.  Not much discomfort  Objective: Vital signs in last 24 hours: Temp:  [97.9 F (36.6 C)-98.8 F (37.1 C)] 98.8 F (37.1 C) (11/12 0603) Pulse Rate:  [60-110] 110 (11/12 0603) Resp:  [18] 18 (11/12 0603) BP: (107-144)/(70-84) 140/84 mmHg (11/12 0603) SpO2:  [91 %-100 %] 91 % (11/12 0603) Last BM Date: 12/22/12  Intake/Output from previous day: 11/11 0701 - 11/12 0700 In: 3000 [I.V.:3000] Out: 1845 [Urine:1800; Drains:45] Intake/Output this shift: Total I/O In: -  Out: 355 [Urine:325; Drains:30]  General appearance: alert, cooperative and no distress Resp: clear to auscultation bilaterally Cardio: HR 85 now, regular GI: soft, mild tenderness, ND, wounds okay, JP ss  Lab Results:   Recent Labs  12/24/12 0335 12/25/12 0018  WBC 15.0* 9.7  HGB 13.5 13.4  HCT 39.9 40.1  PLT 253 224   BMET  Recent Labs  12/24/12 0335 12/25/12 0018  NA 137 137  K 5.1 4.1  CL 101 103  CO2 30 28  GLUCOSE 133* 120*  BUN 12 9  CREATININE 0.92 0.82  CALCIUM 9.1 8.8   PT/INR No results found for this basename: LABPROT, INR,  in the last 72 hours ABG No results found for this basename: PHART, PCO2, PO2, HCO3,  in the last 72 hours  Studies/Results: No results found.  Anti-infectives: Anti-infectives   Start     Dose/Rate Route Frequency Ordered Stop   12/23/12 0600  ertapenem (INVANZ) 1 g in sodium chloride 0.9 % 50 mL IVPB     1 g 100 mL/hr over 30 Minutes Intravenous  Once 12/23/12 0529 12/23/12 0739      Assessment/Plan: s/p Procedure(s): LAPAROSCOPIC ROUX-EN-Y GASTRIC BYPASS WITH UPPER ENDOSCOPY (N/A) UPPER GI ENDOSCOPY (N/A) I think that she looks okay.  I would like to keep her a little longer until we are sure that the nausea has resolved.  Maybe okay for discharge to home later today.  LOS: 2 days    [name] DAVID 12/25/2012

## 2012-12-25 NOTE — Progress Notes (Signed)
Patient dressed and ready to be d/c to home.  D/C instructions given in presence of spouse. JP d/c'd to lt abd. Po pain med accepted for c/o pain post jp d/c. Ambulatory and voiding on own. Refused 2pm unjury--does not like. Tolerated a.m. Dose of unjury plus water & ice chips. To vehicle via wheelchair accompanied per staff member and spouse with belongings.

## 2012-12-25 NOTE — Progress Notes (Signed)
Patient alert and oriented, pain is controlled. Patient is tolerating fluids, plan to advance to protein shake today.  Reviewed Gastric Bypass discharge instructions with patient and patient is able to articulate understanding.  Provided discharge teaching packet, BELT program Flyer, Support Group information and [facility] information.  All questions answered

## 2012-12-28 ENCOUNTER — Emergency Department (HOSPITAL_COMMUNITY): Payer: BC Managed Care – PPO

## 2012-12-28 ENCOUNTER — Emergency Department (HOSPITAL_COMMUNITY)
Admission: EM | Admit: 2012-12-28 | Discharge: 2012-12-28 | Disposition: A | Discharge status: Home / Self Care | Payer: BC Managed Care – PPO | Attending: Emergency Medicine | Admitting: Emergency Medicine

## 2012-12-28 ENCOUNTER — Telehealth (INDEPENDENT_AMBULATORY_CARE_PROVIDER_SITE_OTHER): Payer: Self-pay | Admitting: General Surgery

## 2012-12-28 ENCOUNTER — Other Ambulatory Visit (INDEPENDENT_AMBULATORY_CARE_PROVIDER_SITE_OTHER): Payer: Self-pay | Admitting: General Surgery

## 2012-12-28 ENCOUNTER — Encounter (HOSPITAL_COMMUNITY): Payer: Self-pay | Admitting: Emergency Medicine

## 2012-12-28 DIAGNOSIS — K5900 Constipation, unspecified: Secondary | ICD-10-CM

## 2012-12-28 DIAGNOSIS — Z9884 Bariatric surgery status: Secondary | ICD-10-CM | POA: Insufficient documentation

## 2012-12-28 DIAGNOSIS — Z87891 Personal history of nicotine dependence: Secondary | ICD-10-CM | POA: Insufficient documentation

## 2012-12-28 DIAGNOSIS — Z9981 Dependence on supplemental oxygen: Secondary | ICD-10-CM | POA: Insufficient documentation

## 2012-12-28 DIAGNOSIS — Z79899 Other long term (current) drug therapy: Secondary | ICD-10-CM | POA: Insufficient documentation

## 2012-12-28 DIAGNOSIS — G4733 Obstructive sleep apnea (adult) (pediatric): Secondary | ICD-10-CM | POA: Insufficient documentation

## 2012-12-28 DIAGNOSIS — Z90710 Acquired absence of both cervix and uterus: Secondary | ICD-10-CM | POA: Insufficient documentation

## 2012-12-28 DIAGNOSIS — I10 Essential (primary) hypertension: Secondary | ICD-10-CM | POA: Insufficient documentation

## 2012-12-28 DIAGNOSIS — F3289 Other specified depressive episodes: Secondary | ICD-10-CM | POA: Insufficient documentation

## 2012-12-28 DIAGNOSIS — F329 Major depressive disorder, single episode, unspecified: Secondary | ICD-10-CM | POA: Insufficient documentation

## 2012-12-28 DIAGNOSIS — K625 Hemorrhage of anus and rectum: Secondary | ICD-10-CM | POA: Insufficient documentation

## 2012-12-28 DIAGNOSIS — Z8739 Personal history of other diseases of the musculoskeletal system and connective tissue: Secondary | ICD-10-CM | POA: Insufficient documentation

## 2012-12-28 DIAGNOSIS — Z8742 Personal history of other diseases of the female genital tract: Secondary | ICD-10-CM | POA: Insufficient documentation

## 2012-12-28 DIAGNOSIS — K219 Gastro-esophageal reflux disease without esophagitis: Secondary | ICD-10-CM | POA: Insufficient documentation

## 2012-12-28 DIAGNOSIS — D649 Anemia, unspecified: Secondary | ICD-10-CM | POA: Insufficient documentation

## 2012-12-28 LAB — OCCULT BLOOD, POC DEVICE: Fecal Occult Bld: POSITIVE — AB

## 2012-12-28 MED ORDER — SODIUM CHLORIDE 0.9 % IV BOLUS (SEPSIS)
1000.0000 mL | Freq: Once | INTRAVENOUS | Status: AC
  Administered 2012-12-28: 1000 mL via INTRAVENOUS

## 2012-12-28 MED ORDER — SODIUM CHLORIDE 0.9 % IV SOLN: Freq: Once | INTRAVENOUS | Status: DC

## 2012-12-28 MED ORDER — LIDOCAINE HCL 2 % EX GEL: CUTANEOUS | Status: DC

## 2012-12-28 MED ORDER — DOCUSATE SODIUM 50 MG/5ML PO LIQD: 100.0000 mg | Freq: Two times a day (BID) | ORAL | Status: DC | PRN

## 2012-12-28 NOTE — Telephone Encounter (Signed)
She called complaining of her constipation and hemorrhoids.  I asked her to come to the [facility] ER so I could examine her.  I met her in the triage area and examined her anal area with her husband and nurse chaperone present in the room.  She has a moderate sized thrombosed hemorrhoid at the left lateral position but it is not obstructing.  I did not feel any impacted stool on exam.  No evidence of bleeding though she has had some black stools which are likely a result of the recent RYGB.  I did not see any obvious prolapse.  She is not having any abdominal pain or distension.  I recommended that she continue with the miralax and I also prescribed some colace as well.  I recommended that she focus on her hydration and she should do sitz baths as well.  I also prescribed some lidocaine jelly for use PRN.  She will continue with bowel regimen and hydration and use her pain meds sparingly for her discomfort.

## 2012-12-28 NOTE — ED Provider Notes (Signed)
CSN: [id number]     Arrival date & time 12/28/12  0242 History   First MD Initiated Contact with Patient 12/28/12 0255     Chief Complaint  Patient presents with  . Constipation   (Consider location/radiation/quality/duration/timing/severity/associated sxs/prior Treatment) HPI Patient presents to the ED with cc of constipation. She is s/p roux-en-y performed by Dr. Biagio Quint on 12/23/2012/. Patient is currently on a liquid diet and cannot intake more then a few ounces at a time. She has not made a bowel movement for 6 days. She has tried miralax, 2 fleets enemas, and her huband tried to manually extract her stool at home without succes.  Patient does not have severe pain, but has constant tenesmus and pressure. She complains of severe discomfort. Denies fever, chills, nausea, vomiting or urinary symptoms.  Past Medical History  Diagnosis Date  . Hypertension   . Cystocele   . Rectocele     stress incontinence  . Dysfunctional uterine bleeding     resolved after  Hysterectomy(hx. fibroids)  . Anemia     Hgb 5 02/18/10, blood transfusion 02/18/10  . Depression   . PCOS (polycystic ovarian syndrome)   . Obesity, Class III, BMI 40-49.9 (morbid obesity)   . Cervical radiculopathy at C6 01/25/2011  . Lateral epicondylitis  of elbow 02/01/2011  . Morbid obesity   . OSA on CPAP     cpap used nightly, settings 13  . PONV (postoperative nausea and vomiting)   . Acid reflux 07/22/2012    much improved with Bariatric diet   Past Surgical History  Procedure Laterality Date  . Vaginal delivery  1993  . Abdominal hysterectomy    . Cholecystectomy  1993    laparoscopic  . Gastric roux-en-y N/A 12/23/2012    Procedure: LAPAROSCOPIC ROUX-EN-Y GASTRIC BYPASS WITH UPPER ENDOSCOPY;  Surgeon: Lodema Pilot, DO;  Location: [facility];  Service: General;  Laterality: N/A;  . Upper gi endoscopy N/A 12/23/2012    Procedure: UPPER GI ENDOSCOPY;  Surgeon: Lodema Pilot, DO;  Location: [facility];  Service: General;   Laterality: N/A;   Family History  Problem Relation Age of Onset  . Colon cancer Maternal Uncle   . Hyperlipidemia Mother   . Hypertension Mother   . Hyperlipidemia Father   . Hypertension Father   . Heart attack Father 6    5-bypass surgery  . Heart disease Father   . Diabetes Maternal Aunt   . Cancer Paternal Uncle     colon   History  Substance Use Topics  . Smoking status: Former Smoker    Types: Cigarettes    Quit date: 02/14/1995  . Smokeless tobacco: Never Used  . Alcohol Use: No   OB History   Grav Para Term Preterm Abortions TAB SAB Ect Mult Living   1 1 0 0 0 0 0 0 0 1      Review of Systems  Constitutional: Negative for fever and chills.  HENT: Negative for trouble swallowing.   Respiratory: Negative for shortness of breath.   Cardiovascular: Negative for chest pain.  Gastrointestinal: Positive for constipation and rectal pain. Negative for nausea, vomiting, abdominal pain and diarrhea.  Genitourinary: Negative for dysuria and hematuria.  Musculoskeletal: Negative for arthralgias and myalgias.  Skin: Negative for rash.  Neurological: Negative for numbness.  All other systems reviewed and are negative.    Allergies  Review of patient's allergies indicates no known allergies.  Home Medications   Current Outpatient Rx  Name  Route  Sig  Dispense  Refill  . cyanocobalamin 500 MCG tablet   Oral   Take 500 mcg by mouth daily.         . hydrochlorothiazide (HYDRODIURIL) 25 MG tablet   Oral   Take 25 mg by mouth daily.         Marland Kitchen lisinopril (PRINIVIL,ZESTRIL) 10 MG tablet   Oral   Take 10 mg by mouth daily. 12-16-12 Lisinopril dose decreased to 5mg   Orally  Daily.         . Multiple Vitamin (MULTIVITAMIN WITH MINERALS) TABS tablet   Oral   Take 1 tablet by mouth daily.         . ondansetron (ZOFRAN ODT) 4 MG disintegrating tablet   Oral   Take 1 tablet (4 mg total) by mouth every 8 (eight) hours as needed for nausea or vomiting.   20  tablet   0   . oxybutynin (DITROPAN) 5 MG tablet   Oral   Take 5 mg by mouth daily as needed (urinary).         Marland Kitchen oxyCODONE (ROXICODONE) 5 MG/5ML solution   Oral   Take 5-10 mLs (5-10 mg total) by mouth every 4 (four) hours as needed for severe pain.   500 mL   0   . pantoprazole (PROTONIX) 40 MG tablet   Oral   Take 40 mg by mouth daily as needed (heartburn).         . EXPIRED: omeprazole (PRILOSEC) 20 MG capsule   Oral   Take 20 mg by mouth daily as needed (heart burn).          BP 128/84  Pulse 106  Temp(Src) 98.2 F (36.8 C) (Oral)  Resp 20  SpO2 100%  LMP 03/21/2010 Physical Exam  Vitals reviewed. Constitutional: She is oriented to person, place, and time. She appears well-developed and well-nourished. No distress.  HENT:  Head: Normocephalic and atraumatic.  Eyes: Conjunctivae are normal. No scleral icterus.  Neck: Normal range of motion.  Cardiovascular: Normal rate, regular rhythm and normal heart sounds.  Exam reveals no gallop and no friction rub.   No murmur heard. Pulmonary/Chest: Effort normal and breath sounds normal. No respiratory distress.  Abdominal: Soft. Bowel sounds are normal. She exhibits no distension and no mass. Tenderness: 4 well healing surgical sites without signs of infection. There is no guarding.  Genitourinary: Rectal exam shows tenderness. Rectal exam shows no external hemorrhoid and no fissure. Mass: palpable stool high in rectum. Guaiac positive stool.  Neurological: She is alert and oriented to person, place, and time.  Skin: Skin is warm and dry. She is not diaphoretic.    ED Course  Fecal disimpaction Date/Time: 12/28/2012 3:46 PM Performed by: Arthor Captain Authorized by: Arthor Captain Consent: Verbal consent obtained. Patient identity confirmed: verbally with patient Patient tolerance: Patient tolerated the procedure well with no immediate complications. Comments: Large volume of hard and liquid stool remove.     (including critical care time) Labs Review Labs Reviewed - No data to display Imaging Review No results found.  EKG Interpretation   None       MDM   1. Constipation     Patient with constipation. On opiates,liquid diet and s/p gastric bypass on 12/23/2013 Will get acute abdominal series. C/o bloody stool . Hx of internal hemorrhoids.   Patient xray without acute abnormality. Hemoccult positive.  Large amount of stool removed on disimpaction. Patient had second large movement with soap suds enema. She has had sig  relief. Encourage continued  Use of miralax. Patient is to follow up with her surgeon or return to ED for worsening symptoms.  The patient appears reasonably screened and/or stabilized for discharge and I doubt any other medical condition or other [facility] requiring further screening, evaluation, or treatment in the ED at this time prior to discharge.     Arthor Captain, PA-C 12/28/12 1640

## 2012-12-28 NOTE — ED Notes (Signed)
Pt arrived to the ED with a complaint of constipation.  Pt had gastric bypass last Monday and pt has not had a bowel movement since Sunday.  Pt states she has been taking Miralax for 2 days and her husband has given 2 enemas and attempted disimpaction with no success.  Pt is having rectum pain.

## 2012-12-28 NOTE — Telephone Encounter (Signed)
Has been constipated and trying to take miralax without much results.  Took some enemas yesterday and was in the ER as well and finally the enemas given in the ER helped to move her bowels.  She says that she has a large hemorrhoid which is impeding the passage of stool but also says that she has history of prolapse.  I recommended that she come to [facility] so we can examine the area and make sure that she does not have a physical blockage even though the xrays looked fine last night.

## 2012-12-30 NOTE — ED Provider Notes (Signed)
Medical screening examination/treatment/procedure(s) were conducted as a shared visit with non-physician practitioner(s) and myself.  I personally evaluated the patient during the encounter.  EKG Interpretation   None       Well appearing. Improved after disimpaction and bowel regimen in ER. abd benign. Dc home in good condition. [facility] follow up  Lyanne Co, MD 12/30/12 [phone]

## 2012-12-31 NOTE — Discharge Summary (Signed)
Physician Discharge Summary  Patient ID: [name] MRN: [id number] DOB/AGE: 1965-10-22 47 y.o.  Admit date: 12/23/2012 Discharge date: 12/25/2012  Admission Diagnoses: obesity  Discharge Diagnoses: same Active Problems:   * No active hospital problems. *   Discharged Condition: stable  Hospital Course: to OR 12/23/12 for lap RYGB.  Tolerated the procedure well.  On POD 1 she was doing well and diet advanced.  She was tolerating diet and pain controlled on POD 2 and stable for discharge.  Consults: None  Significant Diagnostic Studies: none  Treatments: surgery: 12/23/12 lap RYGB  Disposition: ED Dismiss - Never Arrived  Discharge Orders   Future Appointments Provider Department Dept Phone   01/03/2013 9:00 AM Lodema Pilot, DO [facility], [place] [phone]   01/07/2013 3:30 PM [facility] Post-Op Class [facility] [phone]   Future Orders Complete By Expires   Call MD for:  difficulty breathing, headache or visual disturbances  As directed    Call MD for:  extreme fatigue  As directed    Call MD for:  hives  As directed    Call MD for:  persistant dizziness or light-headedness  As directed    Call MD for:  persistant nausea and vomiting  As directed    Call MD for:  redness, tenderness, or signs of infection (pain, swelling, redness, odor or green/yellow discharge around incision site)  As directed    Call MD for:  severe uncontrolled pain  As directed    Call MD for:  temperature >100.4  As directed    Discharge instructions  As directed    Comments:     May shower tomorrow.   Call [phone] for follow up appointment with Biagio Quint in 3 weeks. May start vitamins and protein supplements. May start full liquid diet tomorrow x1 week, then pureed diet x1 week, then soft diet x1 week, then advance to high protein, low fat, low carb diet  Increase activity as tolerated. Drink plenty of fluids. Crush all meds or take liquid  meds x4 weeks.   Increase activity slowly  As directed        Medication List         cyanocobalamin 500 MCG tablet  Take 500 mcg by mouth daily.     hydrochlorothiazide 25 MG tablet  Commonly known as:  HYDRODIURIL  Take 25 mg by mouth daily.     lisinopril 10 MG tablet  Commonly known as:  PRINIVIL,ZESTRIL  Take 10 mg by mouth daily. 12-16-12 Lisinopril dose decreased to 5mg   Orally  Daily.     omeprazole 20 MG capsule  Commonly known as:  PRILOSEC  Take 20 mg by mouth daily as needed (heart burn).     ondansetron 4 MG disintegrating tablet  Commonly known as:  ZOFRAN ODT  Take 1 tablet (4 mg total) by mouth every 8 (eight) hours as needed for nausea or vomiting.     oxybutynin 5 MG tablet  Commonly known as:  DITROPAN  Take 5 mg by mouth daily as needed (urinary).     oxyCODONE 5 MG/5ML solution  Commonly known as:  ROXICODONE  Take 5-10 mLs (5-10 mg total) by mouth every 4 (four) hours as needed for severe pain.     pantoprazole 40 MG tablet  Commonly known as:  PROTONIX  Take 40 mg by mouth daily as needed (heartburn).         SignedLodema Pilot DAVID 12/31/2012, 7:38 AM

## 2013-01-03 ENCOUNTER — Ambulatory Visit (INDEPENDENT_AMBULATORY_CARE_PROVIDER_SITE_OTHER): Payer: BC Managed Care – PPO | Admitting: General Surgery

## 2013-01-03 ENCOUNTER — Encounter (INDEPENDENT_AMBULATORY_CARE_PROVIDER_SITE_OTHER): Payer: Self-pay | Admitting: General Surgery

## 2013-01-03 VITALS — BP 136/68 | HR 84 | Resp 16 | Ht 68.0 in | Wt 338.2 lb

## 2013-01-03 DIAGNOSIS — Z5189 Encounter for other specified aftercare: Secondary | ICD-10-CM

## 2013-01-03 DIAGNOSIS — Z4889 Encounter for other specified surgical aftercare: Secondary | ICD-10-CM

## 2013-01-03 NOTE — Progress Notes (Signed)
Subjective:     Patient ID: [name], female   DOB: Feb 06, 1966, 47 y.o.   MRN: [id number]  HPI This patient follows up 2 weeks status post laparoscopic Roux-en-Y gastric bypass. She seems to be doing fairly well from her procedure and is no longer taking the pain medication. Her main complaint is that of energy which I think is fairly normal for this stage postop.  She is advancing diet appropriately and tolerating a soft diet she's not had any nausea vomiting and her bowels have improved and is taking MiraLAX intermittently. She is staying hydrated and taking her protein supplements.  She is having some mourning 4 foods and has not really exercising but otherwise seems to be okay. Should not have any problems with her hemorrhoids.  Review of Systems     Objective:   Physical Exam She is in no acute distress and nontoxic-appearing Her abdomen is soft nontender and exam her incisions are healing nicely without sign of infection    Assessment:     Status post colonoscopic Roux-en-Y gastric bypass -doing well She seemed to be doing very well for this date postoperatively. She is advancing her diet appropriately and is not having any food intolerance. She is not having fevers or chills or nausea or vomiting or abdominal pain concerning for any postoperative complications. She seems to be staying hydrated and taking her protein supplements. My only concern so far for her is that she really is not exercising and so we discussed the importance of this in order to maximize her weight loss. She doesn't seem to be having any more problems with her hemorrhoids and her bowels are normalizing out.     Plan:     Increase exercise and continue bariatric diet. Advance as instructed. Continue with hydration and protein supplements. She will follow up in 2 months and we will checks nutrition labs at that time. She knows that she will be following up with one of my partners for the remainder of her care.

## 2013-01-07 ENCOUNTER — Encounter: Payer: BC Managed Care – PPO | Attending: General Surgery

## 2013-01-07 DIAGNOSIS — Z713 Dietary counseling and surveillance: Secondary | ICD-10-CM | POA: Insufficient documentation

## 2013-01-07 NOTE — Patient Instructions (Signed)
Goals:  Follow Phase 3A: Soft High Protein Phase  Eat 3-6 small meals/snacks, every 3-5 hrs  Increase lean protein foods to meet 60g goal  Increase fluid intake to 64oz +  Avoid drinking 15 minutes before, during and 30 minutes after eating  Aim for >30 min of physical activity daily per MD 

## 2013-01-07 NOTE — Progress Notes (Signed)
Bariatric Class:  Appt start time: 1530 end time:  1630.  2 Week Post-Operative Nutrition Class  Patient was seen on 01/07/13 for Post[facility]ative [facility].  Pre-Operative Nursing and Nutrition Class:  Appt start time: 1730   End time:  1930.  Surgery date:12/23/12 Surgery type: RYGB  Starting Weight: 368 lbs on 11/04/12 Weight today: 336.0 lbs Weight change: 32 lbs Total weight lost: 32 lbs  TANITA  BODY COMP RESULTS  01/07/13   BMI (kg/m^2) 51.1   Fat Mass (lbs) 192.0   Fat Free Mass (lbs) 144.0   Total Body Water (lbs) 105.5   The following the learning objectives were met by the patient during this course:  Identifies Phase 3A (Soft, High Proteins) Dietary Goals and will begin from 2 weeks post-operatively to 2 months post-operatively  Identifies appropriate sources of fluids and proteins   States protein recommendations and appropriate sources post-operatively  Identifies the need for appropriate texture modifications, mastication, and bite sizes when consuming solids  Identifies appropriate multivitamin and calcium sources post-operatively  Describes the need for physical activity post-operatively and will follow MD recommendations  States when to call healthcare provider regarding medication questions or post-operative complications  Handouts given during class include:  Phase 3A: Soft, High Protein Diet Handout  Band Fill Guidelines Handout  Follow-Up Plan: Patient will follow-up at [facility] in 6 weeks for 8 week post-op nutrition visit for diet advancement per MD.

## 2013-02-18 ENCOUNTER — Encounter: Payer: BC Managed Care – PPO | Attending: General Surgery | Admitting: Dietician

## 2013-02-18 DIAGNOSIS — Z9884 Bariatric surgery status: Secondary | ICD-10-CM | POA: Insufficient documentation

## 2013-02-18 DIAGNOSIS — E669 Obesity, unspecified: Secondary | ICD-10-CM | POA: Insufficient documentation

## 2013-02-18 DIAGNOSIS — Z713 Dietary counseling and surveillance: Secondary | ICD-10-CM | POA: Insufficient documentation

## 2013-02-18 DIAGNOSIS — Z6841 Body Mass Index (BMI) 40.0 and over, adult: Secondary | ICD-10-CM | POA: Insufficient documentation

## 2013-02-18 NOTE — Patient Instructions (Addendum)
Surgery date:12/23/12 Surgery type: RYGB  Starting Weight: 368 lbs on 11/04/12 Weight today: 312 lbs Weight change: 24 lbs Total weight lost: 56 lbs  TANITA  BODY COMP RESULTS  01/07/13 02/18/13   BMI (kg/m^2) 51.1 47.4   Fat Mass (lbs) 192.0 173.5   Fat Free Mass (lbs) 144.0 138.5   Total Body Water (lbs) 105.5 101.5   Goals:  Follow Phase 3B: High Protein + Non-Starchy Vegetables  Eat 3-6 small meals/snacks, every 3-5 hrs  Increase lean protein foods to meet 60g goal  Increase fluid intake to 64oz +  Avoid drinking 15 minutes before, during and 30 minutes after eating  Aim for >30 min of physical activity daily

## 2013-02-18 NOTE — Progress Notes (Signed)
Follow-up visit:  8 Weeks Post-Operative RYGB Surgery  Medical Nutrition Therapy:  Appt start time: 1230 end time:  1300.  Marieta returns today with a 24 lbs weight loss.   Surgery date:12/23/12 Surgery type: RYGB  Starting Weight: 368 lbs on 11/04/12 Weight today: 312 lbs Weight change: 24 lbs Total weight lost: 56 lbs  TANITA  BODY COMP RESULTS  01/07/13 02/18/13   BMI (kg/m^2) 51.1 47.4   Fat Mass (lbs) 192.0 173.5   Fat Free Mass (lbs) 144.0 138.5   Total Body Water (lbs) 105.5 101.5     Primary concerns today: Post-operative Bariatric Surgery Nutrition Management.  Preferred Learning Style:   No preference indicated   Learning Readiness:   Ready  24-hr recall: B (AM): protein drink (premier) or egg with squash or Canadian bacon or cottage cheese or yogurt (10-30 g protein) Snk ( AM): none  L (PM): 2 oz tuna with light mayo or beans or 1/2 [employer] Chili or 4 oz cottage cheese (14-28 g) Snk (PM): string cheese sometimes 6 g D (PM): fish, ground beef, chicken, steak (2-4 oz) 14-28 g protein Snk (PM): cottage cheese, yogurt, "bites" of leftover dinner, no sugar ice cream  Fluid intake: water - 34 oz, 11 oz protein shake, (45 oz)  Estimated total protein intake: 45-90 g   Medications:  See list  Supplementation: taking  Using straws: No Drinking while eating: No Hair loss: No  Carbonated beverages: No N/V/D/C: vomited when ate too big a bit or didn't chew well, constipation - taking stool softeners  Dumping syndrome: None  Recent physical activity:  Water aerobics, 1 x week but working on improving  Progress Towards Goal(s):  In progress.  Handouts given during visit include:  High Protein + Non Starchy Vegetables   Nutritional Diagnosis:  [place]-3.3 Overweight/obesity related to past poor dietary habits and physical inactivity as evidenced by patient w/ recent RYGB surgery following dietary guidelines for continued weight loss.    Intervention:  Nutrition  education/diet advancement  Teaching Method Utilized:  Visual Auditory  Barriers to learning/adherence to lifestyle change: none  Demonstrated degree of understanding via:  Teach Back   Monitoring/Evaluation:  Dietary intake, exercise, lap band fills, and body weight. Follow up in 1 months for 3 month post-op visit.

## 2013-03-07 ENCOUNTER — Ambulatory Visit (INDEPENDENT_AMBULATORY_CARE_PROVIDER_SITE_OTHER): Payer: BC Managed Care – PPO | Admitting: General Surgery

## 2013-03-07 ENCOUNTER — Encounter (INDEPENDENT_AMBULATORY_CARE_PROVIDER_SITE_OTHER): Payer: Self-pay | Admitting: General Surgery

## 2013-03-07 DIAGNOSIS — K912 Postsurgical malabsorption, not elsewhere classified: Secondary | ICD-10-CM

## 2013-03-07 DIAGNOSIS — Z09 Encounter for follow-up examination after completed treatment for conditions other than malignant neoplasm: Secondary | ICD-10-CM

## 2013-03-07 DIAGNOSIS — Z6841 Body Mass Index (BMI) 40.0 and over, adult: Secondary | ICD-10-CM

## 2013-03-07 LAB — COMPREHENSIVE METABOLIC PANEL
ALT: 29 U/L (ref 0–35)
AST: 26 U/L (ref 0–37)
Albumin: 3.9 g/dL (ref 3.5–5.2)
Alkaline Phosphatase: 52 U/L (ref 39–117)
BILIRUBIN TOTAL: 0.5 mg/dL (ref 0.3–1.2)
BUN: 14 mg/dL (ref 6–23)
CO2: 28 meq/L (ref 19–32)
CREATININE: 0.68 mg/dL (ref 0.50–1.10)
Calcium: 9.4 mg/dL (ref 8.4–10.5)
Chloride: 102 mEq/L (ref 96–112)
Glucose, Bld: 82 mg/dL (ref 70–99)
Potassium: 3.9 mEq/L (ref 3.5–5.3)
SODIUM: 139 meq/L (ref 135–145)
Total Protein: 6.8 g/dL (ref 6.0–8.3)

## 2013-03-07 LAB — LIPID PANEL
CHOL/HDL RATIO: 3.8 ratio
CHOLESTEROL: 129 mg/dL (ref 0–200)
HDL: 34 mg/dL — AB (ref >39)
LDL Cholesterol: 69 mg/dL (ref 0–99)
TRIGLYCERIDES: 132 mg/dL (ref <150)
VLDL: 26 mg/dL (ref 0–40)

## 2013-03-07 LAB — CBC WITH DIFFERENTIAL/PLATELET
Basophils Absolute: 0 10*3/uL (ref 0.0–0.1)
Basophils Relative: 1 % (ref 0–1)
EOS ABS: 0.4 10*3/uL (ref 0.0–0.7)
EOS PCT: 6 % — AB (ref 0–5)
HCT: 42 % (ref 36.0–46.0)
HEMOGLOBIN: 14.3 g/dL (ref 12.0–15.0)
LYMPHS ABS: 1.7 10*3/uL (ref 0.7–4.0)
Lymphocytes Relative: 24 % (ref 12–46)
MCH: 31.1 pg (ref 26.0–34.0)
MCHC: 34 g/dL (ref 30.0–36.0)
MCV: 91.3 fL (ref 78.0–100.0)
Monocytes Absolute: 0.6 10*3/uL (ref 0.1–1.0)
Monocytes Relative: 9 % (ref 3–12)
Neutro Abs: 4.4 10*3/uL (ref 1.7–7.7)
Neutrophils Relative %: 60 % (ref 43–77)
Platelets: 223 10*3/uL (ref 150–400)
RBC: 4.6 MIL/uL (ref 3.87–5.11)
RDW: 15.6 % — ABNORMAL HIGH (ref 11.5–15.5)
WBC: 7.2 10*3/uL (ref 4.0–10.5)

## 2013-03-07 LAB — MAGNESIUM: Magnesium: 1.9 mg/dL (ref 1.5–2.5)

## 2013-03-07 LAB — IRON AND TIBC
%SAT: 28 % (ref 20–55)
Iron: 78 ug/dL (ref 42–145)
TIBC: 279 ug/dL (ref 250–470)
UIBC: 201 ug/dL (ref 125–400)

## 2013-03-07 NOTE — Progress Notes (Signed)
Chief complaint: Followup Roux-en-Y gastric bypass  History: Patient returns for routine followup approaching 3 months following laparoscopic Roux-en-Y gastric bypass for morbid obesity with multiple comorbidities including hypertension, GERD, obstructive sleep apnea, hyperlipidemia and PCO S. With glucose intolerance. She is actually getting along quite well. Denies any major GI complaints. She is tolerating a solid high-protein diet with rare dysphagia. No abdominal pain or vomiting. Tends a little toward constipation. She is exercising 3-4 times a week. Energy is much improved.  Exam: BP 130/82  Pulse 64  Temp(Src) 98.1 F (36.7 C) (Temporal)  Resp 14  Ht 5\' 8"  (1.727 m)  Wt 304 lb (137.893 kg)  BMI 46.23 kg/m2  LMP 03/21/2010 Total weight loss 53 pounds since surgery  General: Appears well Abdomen: Soft and nontender. Incisions well healed. Low midline incision without palpable hernia.  Assessment and plan: Doing well following laparoscopic Roux-en-Y gastric bypass with appropriate weight loss. Multiple comorbidities are stable although she is having no reflux and we will try stopping her Protonix. Check lab and vitamin levels today. We reviewed diet and exercise strategies. Return in 3 months.

## 2013-03-08 LAB — VITAMIN B12: Vitamin B-12: 1115 pg/mL — ABNORMAL HIGH (ref 211–911)

## 2013-03-08 LAB — FOLATE: Folate: >20 ng/mL

## 2013-03-12 LAB — VITAMIN B1: Vitamin B1 (Thiamine): 14 nmol/L (ref 8–30)

## 2013-03-31 ENCOUNTER — Encounter: Payer: BC Managed Care – PPO | Attending: General Surgery | Admitting: Dietician

## 2013-03-31 DIAGNOSIS — Z713 Dietary counseling and surveillance: Secondary | ICD-10-CM | POA: Insufficient documentation

## 2013-03-31 DIAGNOSIS — Z6841 Body Mass Index (BMI) 40.0 and over, adult: Secondary | ICD-10-CM | POA: Insufficient documentation

## 2013-03-31 DIAGNOSIS — Z9884 Bariatric surgery status: Secondary | ICD-10-CM | POA: Insufficient documentation

## 2013-03-31 DIAGNOSIS — E669 Obesity, unspecified: Secondary | ICD-10-CM | POA: Insufficient documentation

## 2013-03-31 NOTE — Patient Instructions (Addendum)
Surgery date:12/23/12 Surgery type: RYGB  Starting Weight: 368 lbs on 11/04/12 Weight today: 289.5 lbs  Weight change: 22.5 lbs Total weight lost: 78.5 lbs Weight loss goal: 170 lbs  TANITA  BODY COMP RESULTS  01/07/13 02/18/13 03/31/13   BMI (kg/m^2) 51.1 47.4 44.0   Fat Mass (lbs) 192.0 173.5 152.0   Fat Free Mass (lbs) 144.0 138.5 137.5   Total Body Water (lbs) 105.5 101.5 100.5    Goals:  Follow Phase 3B: High Protein + Non-Starchy Vegetables  Eat 3-6 small meals/snacks, every 3-5 hrs  Increase lean protein foods to meet 60g goal  Increase fluid intake to 64oz +  Avoid drinking 15 minutes before, during and 30 minutes after eating  Aim for >30 min of physical activity daily  Add 15 grams of carbohydrate (fruit, whole grain, starchy vegetable) with meals  Try [employer] Lavash Bread or dried edamame from [employer]

## 2013-03-31 NOTE — Progress Notes (Signed)
Follow-up visit:  12 Weeks Post-Operative RYGB Surgery  Medical Nutrition Therapy:  Appt start time: 1230 end time:  1300.  [name] returns today with a 22.5 lbs weight loss. Tolerating all foods great. Doing very well. Having a few pretzels chips every once in a while.   Surgery date:12/23/12 Surgery type: RYGB  Starting Weight: 368 lbs on 11/04/12 Weight today: 289.5 lbs  Weight change: 22.5 lbs Total weight lost: 78.5 lbs Weight loss goal: 170 lbs  TANITA  BODY COMP RESULTS  01/07/13 02/18/13 03/31/13   BMI (kg/m^2) 51.1 47.4 44.0   Fat Mass (lbs) 192.0 173.5 152.0   Fat Free Mass (lbs) 144.0 138.5 137.5   Total Body Water (lbs) 105.5 101.5 100.5    Primary concerns today: Post-operative Bariatric Surgery Nutrition Management.  Preferred Learning Style:   No preference indicated   Learning Readiness:   Ready  24-hr recall: B (AM): protein drink (premier) or egg with squash or Canadian bacon or cottage cheese or yogurt (10-30 g protein) Snk ( AM): none  L (PM): 2 oz tuna with light mayo or beans or 1/2 [employer] Ch[employer] or 4 oz cottage cheese (14-28 g) Snk (PM): 4 oz cottage cheese or 1 oz meat 7-12 g D (PM): fish, ground beef, chicken, steak (2-4 oz) 14-28 g protein with green beans or spinach or mushrooms/zucchini Snk (PM): cottage cheese, yogurt, "bites" of leftover dinner, no sugar ice cream  Fluid intake: water - 51 oz, 11 oz protein shake, (50-62 oz)  Estimated total protein intake: 45-90 g   Medications:  See list  Supplementation: taking - having trouble getting in all of the calcium   Using straws: No Drinking while eating: No Hair loss: No  Carbonated beverages: No N/V/D/C: vomited when ate too big a bite or didn't chew well, constipation - taking stool softeners  Dumping syndrome: None  Recent physical activity:  Water aerobics, 1 x week but working on improving  Progress Towards Goal(s):  In progress.    Nutritional Diagnosis:  [place]-3.3  Overweight/obesity related to past poor dietary habits and physical inactivity as evidenced by patient w/ recent RYGB surgery following dietary guidelines for continued weight loss.    Intervention:  Nutrition education/diet advancement  Teaching Method Utilized:  Visual Auditory  Barriers to learning/adherence to lifestyle change: none  Demonstrated degree of understanding via:  Teach Back   Monitoring/Evaluation:  Dietary intake, exercise, lap band fills, and body weight. Follow up in 3 months for 6 month post-op visit.

## 2013-06-30 ENCOUNTER — Ambulatory Visit (INDEPENDENT_AMBULATORY_CARE_PROVIDER_SITE_OTHER): Payer: BC Managed Care – PPO | Admitting: General Surgery

## 2013-06-30 ENCOUNTER — Encounter (INDEPENDENT_AMBULATORY_CARE_PROVIDER_SITE_OTHER): Payer: Self-pay | Admitting: General Surgery

## 2013-06-30 ENCOUNTER — Encounter: Payer: BC Managed Care – PPO | Attending: General Surgery | Admitting: Dietician

## 2013-06-30 DIAGNOSIS — Z713 Dietary counseling and surveillance: Secondary | ICD-10-CM | POA: Insufficient documentation

## 2013-06-30 DIAGNOSIS — Z6836 Body mass index (BMI) 36.0-36.9, adult: Secondary | ICD-10-CM | POA: Insufficient documentation

## 2013-06-30 DIAGNOSIS — R634 Abnormal weight loss: Secondary | ICD-10-CM | POA: Insufficient documentation

## 2013-06-30 DIAGNOSIS — E669 Obesity, unspecified: Secondary | ICD-10-CM | POA: Insufficient documentation

## 2013-06-30 DIAGNOSIS — Z9884 Bariatric surgery status: Secondary | ICD-10-CM | POA: Insufficient documentation

## 2013-06-30 MED ORDER — PANTOPRAZOLE SODIUM 40 MG PO TBEC: 40.0000 mg | DELAYED_RELEASE_TABLET | Freq: Every day | ORAL | Status: AC | PRN

## 2013-06-30 NOTE — Progress Notes (Signed)
Follow-up visit:  6 months Post-Operative RYGB Surgery  Medical Nutrition Therapy:  Appt start time: 300 end time:  330.  [name] returns today with a 46.5 lbs weight loss in the last 3 months. Still tolerating all foods well. Doing very well. Says that she can eat anything without getting sick, but is working to not eat the wrong things.  Checking carbs on all foods (well below 15 g). Concerned that she might be able to eat " too much". Can eat about 4 oz chicken breast and green beans and getting hungrier.   Surgery date:12/23/12 Surgery type: RYGB  Starting Weight: 368 lbs on 11/04/12 Weight today: 243.0 lbs  Weight change: 46.5 lbs Total weight lost: 125 lbs Weight loss goal:  170 lbs  TANITA  BODY COMP RESULTS  01/07/13 02/18/13 03/31/13 06/30/13   BMI (kg/m^2) 51.1 47.4 44.0 36.9   Fat Mass (lbs) 192.0 173.5 152.0 113.5   Fat Free Mass (lbs) 144.0 138.5 137.5 129.5   Total Body Water (lbs) 105.5 101.5 100.5 95.0    Primary concerns today: Post-operative Bariatric Surgery Nutrition Management.  Preferred Learning Style:   No preference indicated   Learning Readiness:   Ready  24-hr recall: B (AM): part of protein drink (premier)  (15-24 g protein) Snk ( AM): none  L (PM): 2 oz tuna with light mayo or beans or 1/2 [employer] Chili or 4 oz cottage cheese (14-28 g) Snk (PM): baby bell cheese or  Less than 1 oz nuts 6 g D (PM): fish, ground beef, chicken, steak (2-4 oz) 14-28 g protein with green beans or spinach or mushrooms/zucchini Snk (PM): cottage cheese, yogurt, "bites" of leftover dinner, no sugar ice cream  Fluid intake: water - 34-51 oz, 6 oz protein shake, 8 oz decaf coffee (still stuggles to get 64 oz) Estimated total protein intake: ~60 g   Medications:  See list  Supplementation: taking - having trouble getting in all of the calcium   Using straws: No Drinking while eating: No Hair loss: No  Carbonated beverages: No N/V/D/C: constipation - taking stool  softeners  Dumping syndrome: None  Recent physical activity:  1.5 mile per day walking 45-50 minutes  Progress Towards Goal(s):  In progress.    Nutritional Diagnosis:  -3.3 Overweight/obesity related to past poor dietary habits and physical inactivity as evidenced by patient w/ recent RYGB surgery following dietary guidelines for continued weight loss.    Intervention:  Nutrition education/diet enforcement.   Teaching Method Utilized:  Visual Auditory  Barriers to learning/adherence to lifestyle change: none  Demonstrated degree of understanding via:  Teach Back   Monitoring/Evaluation:  Dietary intake, exercise, and body weight. Follow up in 3 months for 9 month post-op visit.

## 2013-06-30 NOTE — Progress Notes (Signed)
Chief complaint: Followup gastric bypass  History: Patient returned to the office now 6 months following laparoscopic Roux-en-Y gastric bypass by Dr. Lilyan Punt for morbid obesity with multiple comorbidities including hypertension, reflux and obstructive sleep apnea. She is off her antihypertensives. She still is on CPAP but at reduced settings. Reflux is much improved requiring only occasional protonic. She does not have any significant abdominal pain or vomiting or GI complaints. She is exercising regularly and taking her vitamin supplements. She does have a low midline hernia that was noted at the time of her bypass surgery with a bulge but no pain or obstructive symptoms.  Past Medical History  Diagnosis Date  . Hypertension   . Cystocele   . Rectocele     stress incontinence  . Dysfunctional uterine bleeding     resolved after  Hysterectomy(hx. fibroids)  . Anemia     Hgb 5 02/18/10, blood transfusion 02/18/10  . Depression   . PCOS (polycystic ovarian syndrome)   . Obesity, Class III, BMI 40-49.9 (morbid obesity)   . Cervical radiculopathy at C6 01/25/2011  . Lateral epicondylitis  of elbow 02/01/2011  . Morbid obesity   . OSA on CPAP     cpap used nightly, settings 13  . PONV (postoperative nausea and vomiting)   . Acid reflux 07/22/2012    much improved with Bariatric diet   Past Surgical History  Procedure Laterality Date  . Vaginal delivery  1993  . Abdominal hysterectomy    . Cholecystectomy  1993    laparoscopic  . Gastric roux-en-y N/A 12/23/2012    Procedure: LAPAROSCOPIC ROUX-EN-Y GASTRIC BYPASS WITH UPPER ENDOSCOPY;  Surgeon: Madilyn Hook, DO;  Location: [facility];  Service: General;  Laterality: N/A;  . Upper gi endoscopy N/A 12/23/2012    Procedure: UPPER GI ENDOSCOPY;  Surgeon: Madilyn Hook, DO;  Location: [facility];  Service: General;  Laterality: N/A;   Current Outpatient Prescriptions  Medication Sig Dispense Refill  . cyanocobalamin 500 MCG tablet Take 500 mcg by mouth  daily.      Marland Kitchen docusate (COLACE) 50 MG/5ML liquid Take 10 mLs (100 mg total) by mouth 2 (two) times daily as needed for mild constipation.  240 mL  0  . estradiol (ESTRACE) 1 MG tablet       . ondansetron (ZOFRAN ODT) 4 MG disintegrating tablet Take 1 tablet (4 mg total) by mouth every 8 (eight) hours as needed for nausea or vomiting.  20 tablet  0  . pantoprazole (PROTONIX) 40 MG tablet Take 40 mg by mouth daily as needed (heartburn).      Marland Kitchen omeprazole (PRILOSEC) 20 MG capsule Take 20 mg by mouth daily as needed (heart burn).       No current facility-administered medications for this visit.   No Known Allergies History  Substance Use Topics  . Smoking status: Former Smoker    Types: Cigarettes    Quit date: 02/14/1995  . Smokeless tobacco: Never Used  . Alcohol Use: No   Exam: BP 104/70  Pulse 68  Temp(Src) 98.1 F (36.7 C) (Temporal)  Resp 16  Ht 5\' 8"  (1.727 m)  Wt 242 lb 9.6 oz (110.043 kg)  BMI 36.90 kg/m2  LMP 03/21/2010 Total weight loss 114 pounds, 50 pounds from last visit General: Appears well Skin: No rash infection Lungs: Clear breath sounds bilaterally Cardiac: Regular rate and rhythm no murmurs Abdomen: Soft and nontender. Well-healed incisions. There is a low midline hernia the site of previous hysterectomy Extremities: No edema  or joint swelling  Assessment and plan: Doing very well following gastric bypass with excellent weight loss, resolution of hypertension, significant improvement in sleep apnea and GERD. We discussed diet exercise stretches. She likely will will need a ventral hernia repair which may be ideal to combined panniculectomy. We would not want to do this until one year postop. Check vitamin levels of electrolytes and CBC today and we will call her with the results. Return in 3 months.

## 2013-06-30 NOTE — Patient Instructions (Addendum)
  Goals:  Follow Phase 3B: High Protein + Non-Starchy Vegetables  Eat 3-6 small meals/snacks, every 3-5 hrs  Increase lean protein foods to meet 60g goal  Increase fluid intake to 64oz +  Avoid drinking 15 minutes before, during and 30 minutes after eating  Aim for >30 min of physical activity daily  Add 15 grams of carbohydrate (fruit, whole grain, starchy vegetable) with meals  Surgery date:12/23/12 Surgery type: RYGB  Starting Weight: 368 lbs on 11/04/12 Weight today: 243.0 lbs  Weight change: 46.5 lbs Total weight lost: 125 lbs Weight loss goal:  170 lbs  TANITA  BODY COMP RESULTS  01/07/13 02/18/13 03/31/13 06/30/13   BMI (kg/m^2) 51.1 47.4 44.0 36.9   Fat Mass (lbs) 192.0 173.5 152.0 113.5   Fat Free Mass (lbs) 144.0 138.5 137.5 129.5   Total Body Water (lbs) 105.5 101.5 100.5 95.0

## 2013-07-01 ENCOUNTER — Encounter (INDEPENDENT_AMBULATORY_CARE_PROVIDER_SITE_OTHER): Payer: Self-pay | Admitting: General Surgery

## 2013-07-01 LAB — LIPID PANEL
Cholesterol: 131 mg/dL (ref 0–200)
HDL: 35 mg/dL — ABNORMAL LOW (ref >39)
LDL Cholesterol: 78 mg/dL (ref 0–99)
Total CHOL/HDL Ratio: 3.7 Ratio
Triglycerides: 90 mg/dL (ref <150)
VLDL: 18 mg/dL (ref 0–40)

## 2013-07-01 LAB — HEMOGLOBIN A1C
Hgb A1c MFr Bld: 5.1 % (ref <5.7)
Mean Plasma Glucose: 100 mg/dL (ref <117)

## 2013-07-01 LAB — CBC
HEMATOCRIT: 42 % (ref 36.0–46.0)
HEMOGLOBIN: 14.4 g/dL (ref 12.0–15.0)
MCH: 32.1 pg (ref 26.0–34.0)
MCHC: 34.3 g/dL (ref 30.0–36.0)
MCV: 93.8 fL (ref 78.0–100.0)
Platelets: 215 10*3/uL (ref 150–400)
RBC: 4.48 MIL/uL (ref 3.87–5.11)
RDW: 13.6 % (ref 11.5–15.5)
WBC: 5.6 10*3/uL (ref 4.0–10.5)

## 2013-07-01 LAB — COMPREHENSIVE METABOLIC PANEL
ALBUMIN: 3.8 g/dL (ref 3.5–5.2)
ALT: 19 U/L (ref 0–35)
AST: 20 U/L (ref 0–37)
Alkaline Phosphatase: 53 U/L (ref 39–117)
BUN: 15 mg/dL (ref 6–23)
CALCIUM: 9.4 mg/dL (ref 8.4–10.5)
CHLORIDE: 103 meq/L (ref 96–112)
CO2: 28 mEq/L (ref 19–32)
Creat: 0.69 mg/dL (ref 0.50–1.10)
Glucose, Bld: 79 mg/dL (ref 70–99)
POTASSIUM: 3.8 meq/L (ref 3.5–5.3)
Sodium: 142 mEq/L (ref 135–145)
Total Bilirubin: 0.8 mg/dL (ref 0.2–1.2)
Total Protein: 6.6 g/dL (ref 6.0–8.3)

## 2013-07-01 LAB — IRON AND TIBC
%SAT: 39 % (ref 20–55)
Iron: 101 ug/dL (ref 42–145)
TIBC: 260 ug/dL (ref 250–470)
UIBC: 159 ug/dL (ref 125–400)

## 2013-07-01 LAB — T4: T4 TOTAL: 8.5 ug/dL (ref 5.0–12.5)

## 2013-07-01 LAB — TSH: TSH: 3.345 u[IU]/mL (ref 0.350–4.500)

## 2013-07-02 NOTE — Telephone Encounter (Signed)
Copy of lab results faxed to Dr. Harlan Stains @ [phone].

## 2013-07-02 NOTE — Telephone Encounter (Signed)
Message copied by Ivor Costa on Wed Jul 02, 2013  9:26 AM ------      Message from: Excell Seltzer T      Created: Wed Jul 02, 2013  8:22 AM       Can you send her recent lab to Dr Harlan Stains please.  I could not figure out how to do on EPIC ------

## 2013-10-01 ENCOUNTER — Ambulatory Visit (INDEPENDENT_AMBULATORY_CARE_PROVIDER_SITE_OTHER): Payer: BC Managed Care – PPO | Admitting: General Surgery

## 2013-10-01 VITALS — BP 98/66 | HR 53 | Temp 98.2°F | Ht 68.0 in | Wt 211.5 lb

## 2013-10-01 DIAGNOSIS — Z9884 Bariatric surgery status: Secondary | ICD-10-CM

## 2013-10-01 DIAGNOSIS — Z09 Encounter for follow-up examination after completed treatment for conditions other than malignant neoplasm: Secondary | ICD-10-CM

## 2013-10-01 DIAGNOSIS — K912 Postsurgical malabsorption, not elsewhere classified: Secondary | ICD-10-CM

## 2013-10-01 NOTE — Progress Notes (Signed)
Chief complaint: Followup gastric bypass  History: The patient returns now 9 months following laparoscopic Roux-en-Y gastric bypass by Dr. Lilyan Punt with history of extreme morbid obesity with multiple comorbidities including hypertension, GERD, obstructive sleep apnea, hyperlipidemia and PCO S. With glucose intolerance.  She continues to do extremely well with ongoing weight loss. All comorbidities are improved or resolved. Still on CPAP but planning to retest. She denies significant GI complaints. No vomiting or abdominal pain. Continues to exercise regularly. Taking her vitamin and mineral supplements regularly.  Exam: BP 98/66  Pulse 53  Temp(Src) 98.2 F (36.8 C) (Oral)  Ht 5\' 8"  (1.727 m)  Wt 211 lb 8 oz (95.936 kg)  BMI 32.17 kg/m2  LMP 03/21/2010 Total weight loss 146 pounds, 30 pounds from last visit General: Appears well Skin: No rash infection Lungs: Clear equal breath sounds bilaterally Abdomen: Soft and nontender. Moderate midline hernia previously noted nontender. Moderate-sized panus. Extremities: No significant edema  Assessment and plan: Doing extremely well following gastric bypass with excellent weight loss and very good resolution of comorbidities. No complications identified. We will check lab and vitamin levels today and call her with the results. We reviewed diet exercise strategies. Return in 3 months.

## 2013-10-02 ENCOUNTER — Encounter: Payer: BC Managed Care – PPO | Attending: General Surgery | Admitting: Dietician

## 2013-10-02 DIAGNOSIS — Z713 Dietary counseling and surveillance: Secondary | ICD-10-CM | POA: Insufficient documentation

## 2013-10-02 DIAGNOSIS — Z6832 Body mass index (BMI) 32.0-32.9, adult: Secondary | ICD-10-CM | POA: Diagnosis not present

## 2013-10-02 DIAGNOSIS — Z9884 Bariatric surgery status: Secondary | ICD-10-CM | POA: Diagnosis not present

## 2013-10-02 LAB — VITAMIN B12: Vitamin B-12: 1148 pg/mL — ABNORMAL HIGH (ref 211–911)

## 2013-10-02 LAB — COMPREHENSIVE METABOLIC PANEL
ALT: 15 U/L (ref 0–35)
AST: 17 U/L (ref 0–37)
Albumin: 4.1 g/dL (ref 3.5–5.2)
Alkaline Phosphatase: 50 U/L (ref 39–117)
BUN: 19 mg/dL (ref 6–23)
CHLORIDE: 104 meq/L (ref 96–112)
CO2: 30 mEq/L (ref 19–32)
CREATININE: 0.69 mg/dL (ref 0.50–1.10)
Calcium: 9.1 mg/dL (ref 8.4–10.5)
Glucose, Bld: 83 mg/dL (ref 70–99)
Potassium: 3.8 mEq/L (ref 3.5–5.3)
Sodium: 142 mEq/L (ref 135–145)
Total Bilirubin: 0.4 mg/dL (ref 0.2–1.2)
Total Protein: 6.6 g/dL (ref 6.0–8.3)

## 2013-10-02 LAB — IRON AND TIBC
%SAT: 27 % (ref 20–55)
Iron: 77 ug/dL (ref 42–145)
TIBC: 282 ug/dL (ref 250–470)
UIBC: 205 ug/dL (ref 125–400)

## 2013-10-02 LAB — CBC WITH DIFFERENTIAL/PLATELET
Basophils Absolute: 0 10*3/uL (ref 0.0–0.1)
Basophils Relative: 0 % (ref 0–1)
EOS PCT: 3 % (ref 0–5)
Eosinophils Absolute: 0.2 10*3/uL (ref 0.0–0.7)
HCT: 41.2 % (ref 36.0–46.0)
HEMOGLOBIN: 14 g/dL (ref 12.0–15.0)
LYMPHS ABS: 2 10*3/uL (ref 0.7–4.0)
LYMPHS PCT: 29 % (ref 12–46)
MCH: 32.4 pg (ref 26.0–34.0)
MCHC: 34 g/dL (ref 30.0–36.0)
MCV: 95.4 fL (ref 78.0–100.0)
MONOS PCT: 8 % (ref 3–12)
Monocytes Absolute: 0.5 10*3/uL (ref 0.1–1.0)
Neutro Abs: 4.1 10*3/uL (ref 1.7–7.7)
Neutrophils Relative %: 60 % (ref 43–77)
PLATELETS: 235 10*3/uL (ref 150–400)
RBC: 4.32 MIL/uL (ref 3.87–5.11)
RDW: 12.8 % (ref 11.5–15.5)
WBC: 6.8 10*3/uL (ref 4.0–10.5)

## 2013-10-02 LAB — LIPID PANEL
Cholesterol: 122 mg/dL (ref 0–200)
HDL: 40 mg/dL (ref >39)
LDL Cholesterol: 65 mg/dL (ref 0–99)
Total CHOL/HDL Ratio: 3.1 Ratio
Triglycerides: 83 mg/dL (ref <150)
VLDL: 17 mg/dL (ref 0–40)

## 2013-10-02 LAB — FOLATE: Folate: >20 ng/mL

## 2013-10-02 NOTE — Patient Instructions (Addendum)
  Goals:  Follow Phase 3B: High Protein + Non-Starchy Vegetables  Eat 3-6 small meals/snacks, every 3-5 hrs  Increase lean protein foods to meet 60g goal  Increase fluid intake to 64oz +  Avoid drinking 15 minutes before, during and 30 minutes after eating  Aim for >30 min of physical activity daily  Add 15 grams of carbohydrate (fruit, whole grain, starchy vegetable) with meals  -Reduce carbs if you see your weight loss stall -Try [employer] protein chips  TANITA  BODY COMP RESULTS  01/07/13 02/18/13 03/31/13 06/30/13 10/02/13   BMI (kg/m^2) 51.1 47.4 44.0 36.9 32.2   Fat Mass (lbs) 192.0 173.5 152.0 113.5 85   Fat Free Mass (lbs) 144.0 138.5 137.5 129.5 126.5   Total Body Water (lbs) 105.5 101.5 100.5 95.0 92.5

## 2013-10-02 NOTE — Progress Notes (Signed)
Follow-up visit:  9 months Post-Operative RYGB Surgery  Medical Nutrition Therapy:  Appt start time: 325 end time: [place] returns today with a 31.5 lb weight loss. She reports adding "more complex carbs" like shredded wheat. Trying to stay within 15 grams of carbs per meal. Losing inches but scale not moving as fast as it was previously. She states she is not as restricted as she used to be, and is able to eat quite a bit (4-6 ounces of meat with green beans).  Surgery date:12/23/12 Surgery type: RYGB  Starting Weight: 368 lbs on 11/04/12 Weight today: 211.5 lbs Weight change: 31.5 lbs Total weight lost: 156.5 lbs Weight loss goal:  170 lbs  TANITA  BODY COMP RESULTS  01/07/13 02/18/13 03/31/13 06/30/13 10/02/13   BMI (kg/m^2) 51.1 47.4 44.0 36.9 32.2   Fat Mass (lbs) 192.0 173.5 152.0 113.5 85   Fat Free Mass (lbs) 144.0 138.5 137.5 129.5 126.5   Total Body Water (lbs) 105.5 101.5 100.5 95.0 92.5    Primary concerns today: Post-operative Bariatric Surgery Nutrition Management.  Preferred Learning Style:   No preference indicated   Learning Readiness:   Ready  24-hr recall: B (AM): part of protein drink (premier)  (15-24 g protein) Snk ( AM): cottage cheese  L (PM): chicken strips on spinach salad (15g) Snk (PM): string cheese (6g) D (PM): 4-6oz chicken or fish with green beans or pinto beans (28-42g) Snk (PM): cottage cheese or string cheese, peanut butter-filled pretzels   Fluid intake: water with flavoring (~51 oz), 6 oz protein shake, 8 oz decaf coffee (still stuggles to get 64 oz) Estimated total protein intake: 60+g   Medications:  See list  Supplementation: taking - having trouble getting in all of the calcium   Using straws: No Drinking while eating: No Hair loss: No  Carbonated beverages: No N/V/D/C: constipation off and on Dumping syndrome: 1x but not severe with something sweet  Recent physical activity:  1.5 mile per day walking 45-50  minutes  Progress Towards Goal(s):  In progress.    Nutritional Diagnosis:  [place]-3.3 Overweight/obesity related to past poor dietary habits and physical inactivity as evidenced by patient w/ recent RYGB surgery following dietary guidelines for continued weight loss.    Intervention:  Nutrition education/diet enforcement.   Teaching Method Utilized:  Visual Auditory  Barriers to learning/adherence to lifestyle change: none  Demonstrated degree of understanding via:  Teach Back   Monitoring/Evaluation:  Dietary intake, exercise, and body weight. Follow up in 3 months for 12 month post-op visit.

## 2013-10-06 LAB — VITAMIN D 1,25 DIHYDROXY
VITAMIN D 1, 25 (OH) TOTAL: 52 pg/mL (ref 18–72)
VITAMIN D3 1, 25 (OH): 52 pg/mL

## 2013-10-11 LAB — VITAMIN B1: Vitamin B1 (Thiamine): 17 nmol/L (ref 8–30)

## 2013-11-03 ENCOUNTER — Other Ambulatory Visit (HOSPITAL_COMMUNITY): Payer: Self-pay | Admitting: Family Medicine

## 2013-11-03 DIAGNOSIS — Z1231 Encounter for screening mammogram for malignant neoplasm of breast: Secondary | ICD-10-CM

## 2013-12-02 ENCOUNTER — Ambulatory Visit (HOSPITAL_COMMUNITY)
Admission: RE | Admit: 2013-12-02 | Discharge: 2013-12-02 | Disposition: A | Discharge status: Home / Self Care | Payer: BC Managed Care – PPO | Source: Ambulatory Visit | Attending: Family Medicine | Admitting: Family Medicine

## 2013-12-02 DIAGNOSIS — Z1231 Encounter for screening mammogram for malignant neoplasm of breast: Secondary | ICD-10-CM | POA: Insufficient documentation

## 2013-12-15 ENCOUNTER — Encounter (INDEPENDENT_AMBULATORY_CARE_PROVIDER_SITE_OTHER): Payer: Self-pay | Admitting: General Surgery

## 2014-01-01 ENCOUNTER — Encounter: Payer: BC Managed Care – PPO | Attending: General Surgery | Admitting: Dietician

## 2014-01-01 DIAGNOSIS — Z713 Dietary counseling and surveillance: Secondary | ICD-10-CM | POA: Insufficient documentation

## 2014-01-01 DIAGNOSIS — Z6829 Body mass index (BMI) 29.0-29.9, adult: Secondary | ICD-10-CM | POA: Diagnosis not present

## 2014-01-01 NOTE — Patient Instructions (Addendum)
Goals:  Follow Phase 3B: High Protein + Non-Starchy Vegetables  Eat 3-6 small meals/snacks, every 3-5 hrs  Increase lean protein foods to meet 60g goal  Increase fluid intake to 64oz +  Avoid drinking 15 minutes before, during and 30 minutes after eating  Aim for >30 min of physical activity daily  Add 15 grams of carbohydrate (fruit, whole grain, starchy vegetable) with meals  Try smoked baked tofu and tempeh bacon at [employer]  -Reduce carbs if you see your weight loss stall -Try [employer] protein chips  Starting Weight: 368 lbs on 11/04/12 Weight today: 195.5 lbs lbs  Weight change: 16 lbs Total weight lost: 172.5 lbs Weight loss goal:  175 lbs  TANITA  BODY COMP RESULTS  01/07/13 02/18/13 03/31/13 06/30/13 10/02/13 01/01/14   BMI (kg/m^2) 51.1 47.4 44.0 36.9 32.2 29.7   Fat Mass (lbs) 192.0 173.5 152.0 113.5 85 73.0   Fat Free Mass (lbs) 144.0 138.5 137.5 129.5 126.5 122.5   Total Body Water (lbs) 105.5 101.5 100.5 95.0 92.5 89.5

## 2014-01-01 NOTE — Progress Notes (Signed)
Follow-up visit:  12 months Post-Operative RYGB Surgery  Medical Nutrition Therapy:  Appt start time: 330 end time: [place] returns today with a 16 lb weight loss. Feels like things are going well. Is having 1 cup of regular coffee per day. Like to eat tempeh. Gets bored with food easily. Can eat a lot of foods like chili (food that have a lot of liquid).  Had a physical recently and blood pressure is on the low side.   Starting to move into maintenance mode though would still like to lose about 20 more lbs.   Surgery date:12/23/12 Surgery type: RYGB  Starting Weight: 368 lbs on 11/04/12 Weight today: 195.5 lbs lbs  Weight change: 16 lbs Total weight lost: 172.5 lbs Weight loss goal:  175 lbs  TANITA  BODY COMP RESULTS  01/07/13 02/18/13 03/31/13 06/30/13 10/02/13 01/01/14   BMI (kg/m^2) 51.1 47.4 44.0 36.9 32.2 29.7   Fat Mass (lbs) 192.0 173.5 152.0 113.5 85 73.0   Fat Free Mass (lbs) 144.0 138.5 137.5 129.5 126.5 122.5   Total Body Water (lbs) 105.5 101.5 100.5 95.0 92.5 89.5    Primary concerns today: Post-operative Bariatric Surgery Nutrition Management.  Preferred Learning Style:   No preference indicated   Learning Readiness:   Ready  24-hr recall: B (AM): part of protein drink (premier)  (30 g protein) Snk ( AM): cottage cheese  L (PM): chicken strips on spinach salad (15g) Snk (PM): string cheese (6g) D (PM): 4-6oz chicken or fish with green beans or pinto beans (28-42g) Snk (PM): bowl of cereal   Fluid intake: water with flavoring (~51 oz), 11 oz protein shake, 8 oz decaf coffee (still stuggles to get 64 oz) Estimated total protein intake: 60+g   Medications:  See list  Supplementation: taking - having trouble getting in all of the calcium   Using straws: No Drinking while eating: No Hair loss: No  Carbonated beverages: No N/V/D/C: constipation is better Dumping syndrome: 1x but not severe with something sweet  Recent physical activity:  2 mile 3-4 x  week walking 45-50 minutes   Progress Towards Goal(s):  In progress.    Nutritional Diagnosis:  [place]-3.3 Overweight/obesity related to past poor dietary habits and physical inactivity as evidenced by patient w/ recent RYGB surgery following dietary guidelines for continued weight loss.    Intervention:  Nutrition education/diet enforcement.  Goals:  Follow Phase 3B: High Protein + Non-Starchy Vegetables  Eat 3-6 small meals/snacks, every 3-5 hrs  Increase lean protein foods to meet 60g goal  Increase fluid intake to 64oz +  Avoid drinking 15 minutes before, during and 30 minutes after eating  Aim for >30 min of physical activity daily  Add 15 grams of carbohydrate (fruit, whole grain, starchy vegetable) with meals  Try smoked baked tofu and tempeh bacon at [employer]  Teaching Method Utilized:  Visual Auditory  Barriers to learning/adherence to lifestyle change: can eat all foods including sweets  Demonstrated degree of understanding via:  Teach Back   Monitoring/Evaluation:  Dietary intake, exercise, and body weight. Follow up in 6 months for 18 month post-op visit.

## 2014-02-19 ENCOUNTER — Other Ambulatory Visit: Payer: Self-pay | Admitting: General Surgery

## 2014-02-19 LAB — COMPREHENSIVE METABOLIC PANEL
ALBUMIN: 3.9 g/dL (ref 3.5–5.2)
ALT: 19 U/L (ref 0–35)
AST: 21 U/L (ref 0–37)
Alkaline Phosphatase: 49 U/L (ref 39–117)
BUN: 19 mg/dL (ref 6–23)
CO2: 35 mEq/L — ABNORMAL HIGH (ref 19–32)
Calcium: 9.4 mg/dL (ref 8.4–10.5)
Chloride: 104 mEq/L (ref 96–112)
Creat: 0.68 mg/dL (ref 0.50–1.10)
Glucose, Bld: 79 mg/dL (ref 70–99)
POTASSIUM: 4.6 meq/L (ref 3.5–5.3)
Sodium: 143 mEq/L (ref 135–145)
Total Bilirubin: 0.5 mg/dL (ref 0.2–1.2)
Total Protein: 6.4 g/dL (ref 6.0–8.3)

## 2014-02-19 LAB — CBC WITH DIFFERENTIAL/PLATELET
Basophils Absolute: 0 10*3/uL (ref 0.0–0.1)
Basophils Relative: 0 % (ref 0–1)
EOS PCT: 4 % (ref 0–5)
Eosinophils Absolute: 0.2 10*3/uL (ref 0.0–0.7)
HCT: 42.7 % (ref 36.0–46.0)
Hemoglobin: 14.5 g/dL (ref 12.0–15.0)
LYMPHS PCT: 39 % (ref 12–46)
Lymphs Abs: 1.8 10*3/uL (ref 0.7–4.0)
MCH: 32.5 pg (ref 26.0–34.0)
MCHC: 34 g/dL (ref 30.0–36.0)
MCV: 95.7 fL (ref 78.0–100.0)
MONOS PCT: 9 % (ref 3–12)
MPV: 10.4 fL (ref 8.6–12.4)
Monocytes Absolute: 0.4 10*3/uL (ref 0.1–1.0)
NEUTROS PCT: 48 % (ref 43–77)
Neutro Abs: 2.2 10*3/uL (ref 1.7–7.7)
Platelets: 216 10*3/uL (ref 150–400)
RBC: 4.46 MIL/uL (ref 3.87–5.11)
RDW: 12.9 % (ref 11.5–15.5)
WBC: 4.5 10*3/uL (ref 4.0–10.5)

## 2014-02-19 LAB — VITAMIN B12: Vitamin B-12: 920 pg/mL — ABNORMAL HIGH (ref 211–911)

## 2014-02-19 LAB — IRON AND TIBC
%SAT: 41 % (ref 20–55)
Iron: 108 ug/dL (ref 42–145)
TIBC: 263 ug/dL (ref 250–470)
UIBC: 155 ug/dL (ref 125–400)

## 2014-02-19 LAB — LIPID PANEL
CHOLESTEROL: 134 mg/dL (ref 0–200)
HDL: 54 mg/dL (ref >39)
LDL Cholesterol: 66 mg/dL (ref 0–99)
TRIGLYCERIDES: 70 mg/dL (ref <150)
Total CHOL/HDL Ratio: 2.5 Ratio
VLDL: 14 mg/dL (ref 0–40)

## 2014-02-23 LAB — VITAMIN D 1,25 DIHYDROXY
VITAMIN D3 1, 25 (OH): 39 pg/mL
Vitamin D 1, 25 (OH)2 Total: 39 pg/mL (ref 18–72)
Vitamin D2 1, 25 (OH)2: <8 pg/mL

## 2014-02-26 LAB — VITAMIN B1: Vitamin B1 (Thiamine): 49 nmol/L — ABNORMAL HIGH (ref 8–30)

## 2014-05-29 IMAGING — RF DG UGI W/ KUB
15 of 16 series · 15 of 16 positions shown · non-contrast
Comparison: None.

FLUOROSCOPY TIME:  1 min 42 seconds

CLINICAL DATA: Morbid obesity. Pre-op evaluation for bariatric
surgery. Gastroesophageal reflux. Irritable bowel syndrome.

EXAM:
UPPER GI SERIES W/ KUB
TECHNIQUE: After obtaining a scout radiograph a routine upper GI series was
performed using thin barium.

[Series 1: run · 1 of 1 slices shown (1 of 13)]
[im 1/1]
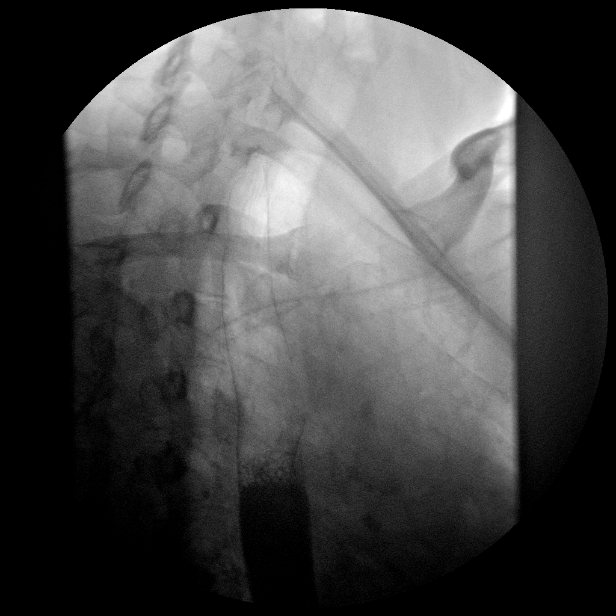

[Series 2: run · 1 of 1 slices shown (2 of 13)]
[im 1/1]
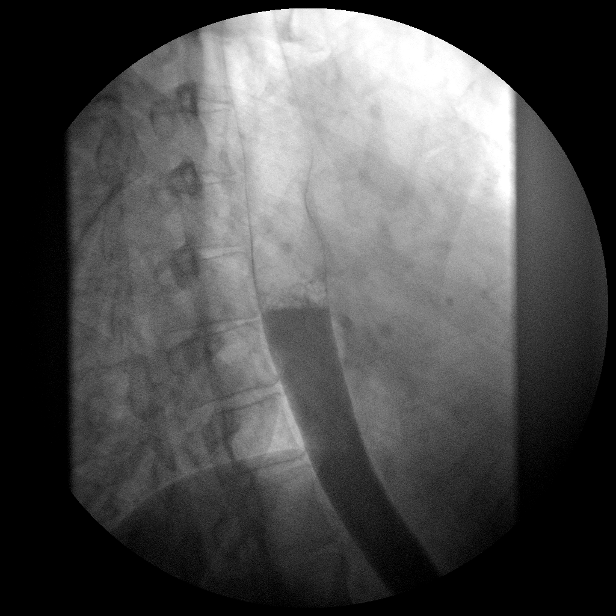

[Series 3: run · 1 of 1 slices shown (3 of 13)]
[im 1/1]
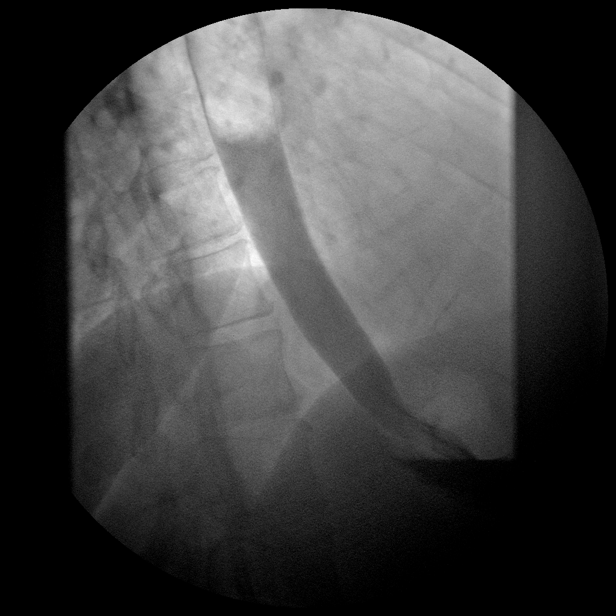

[Series 4: run · 1 of 1 slices shown (4 of 13)]
[im 1/1]
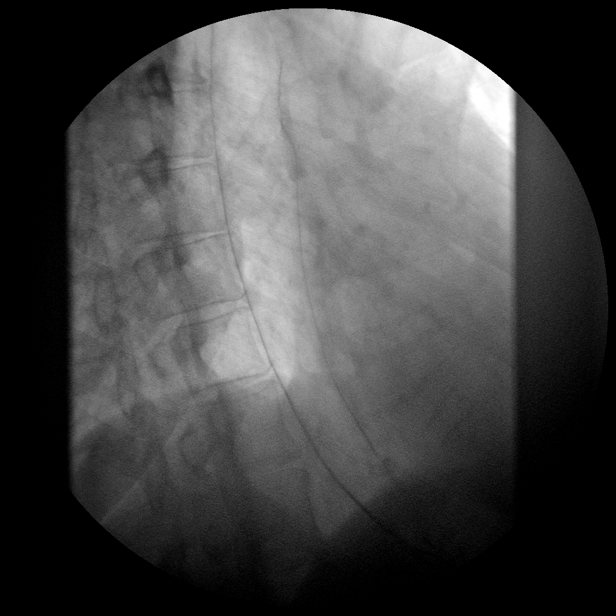

[Series 5: run · 1 of 1 slices shown (5 of 13)]
[im 1/1]
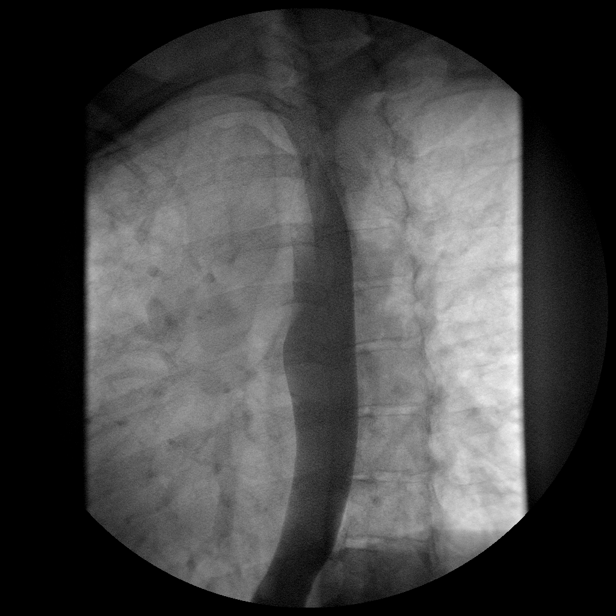

[Series 6: run · 1 of 1 slices shown (6 of 13)]
[im 1/1]
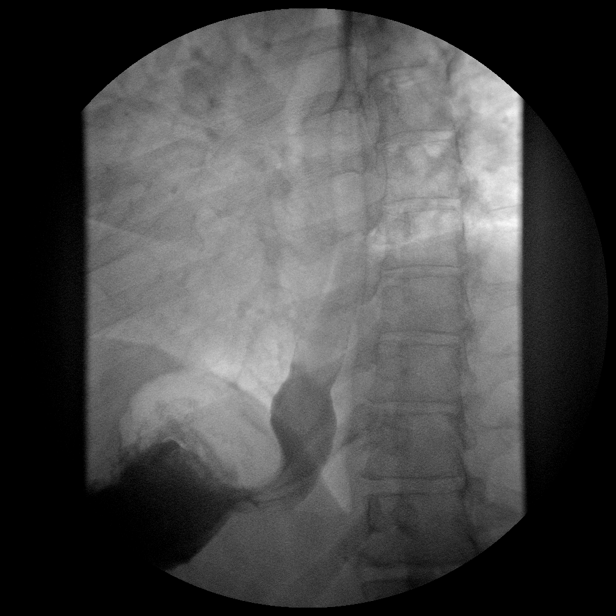

[Series 7: run · 1 of 1 slices shown (7 of 13)]
[im 1/1]
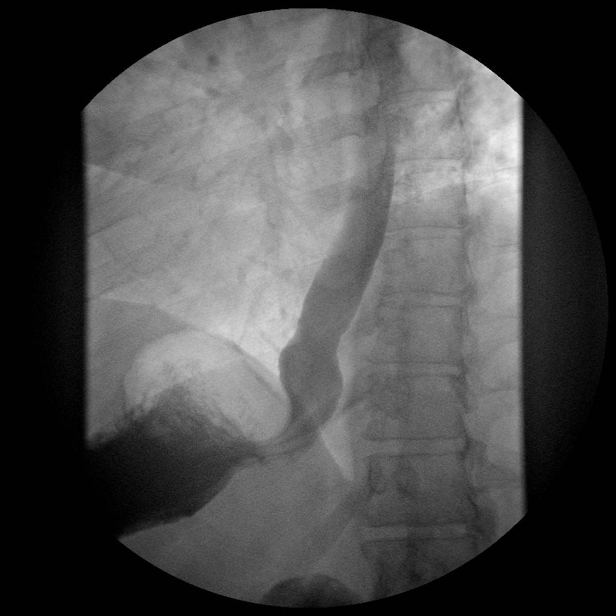

[Series 9: run · 1 of 1 slices shown (8 of 13)]
[im 1/1]
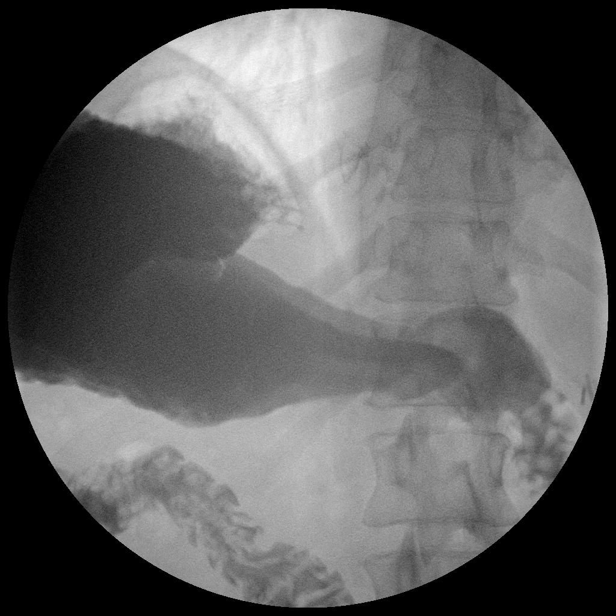

[Series 10: run · 1 of 1 slices shown (9 of 13)]
[im 1/1]
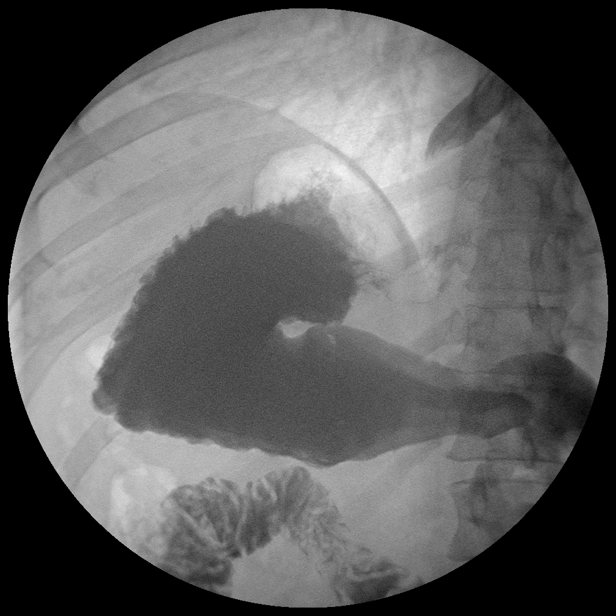

[Series 11: run · 1 of 1 slices shown (10 of 13)]
[im 1/1]
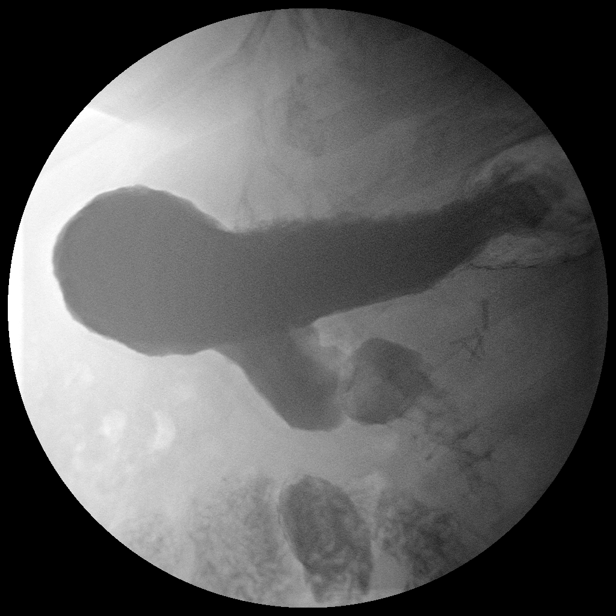

[Series 12: run · 1 of 1 slices shown (11 of 13)]
[im 1/1]
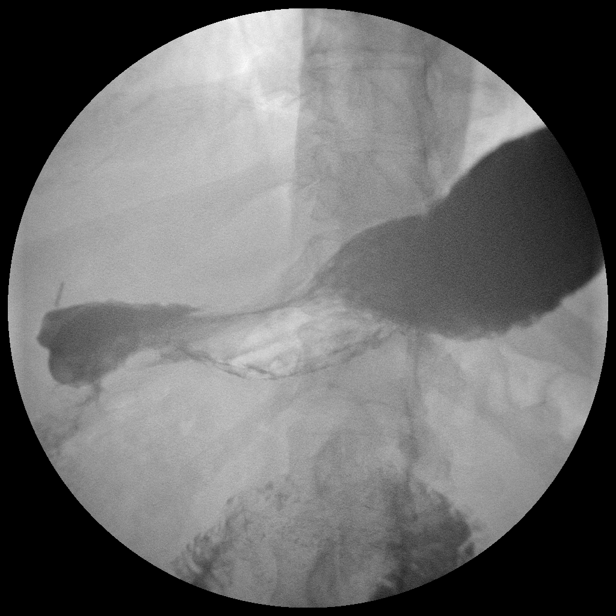

[Series 13: run · 1 of 1 slices shown (12 of 13)]
[im 1/1]
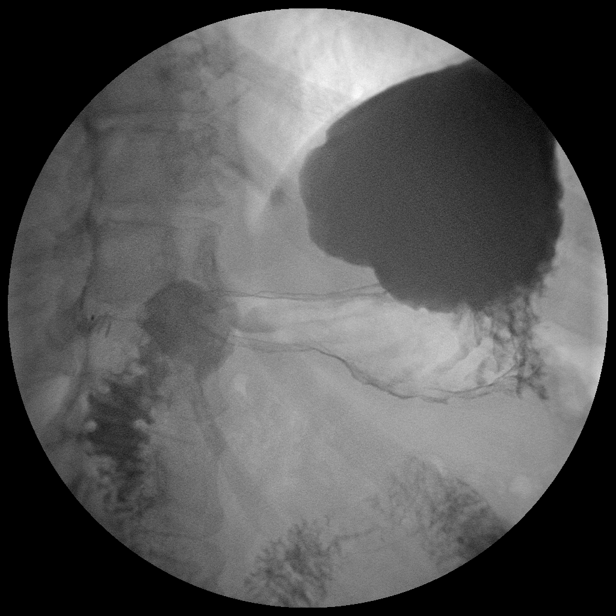

[Series 14: run · 1 of 1 slices shown (13 of 13)]
[im 1/1]
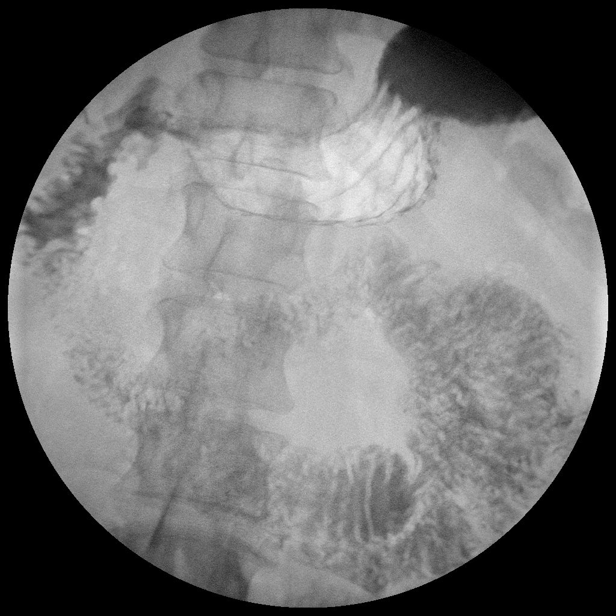

[Series 1001: view not recorded · 0.20mm/px · 1 of 1 slices shown (1 of 2)]
[im 1/1]
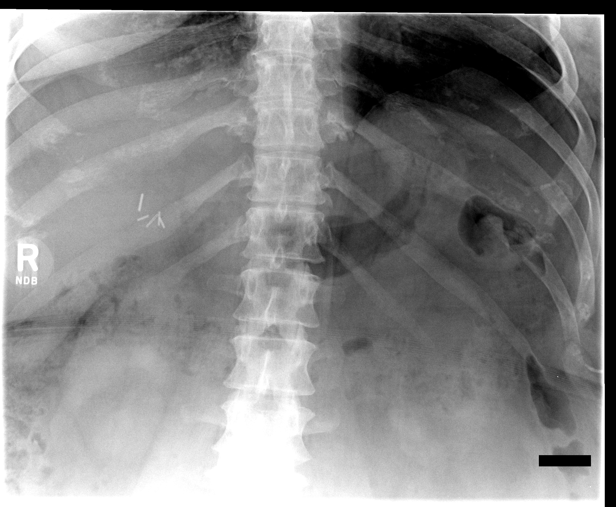

[Series 1002: view not recorded · 0.20mm/px · 1 of 1 slices shown (2 of 2)]
[im 1/1]
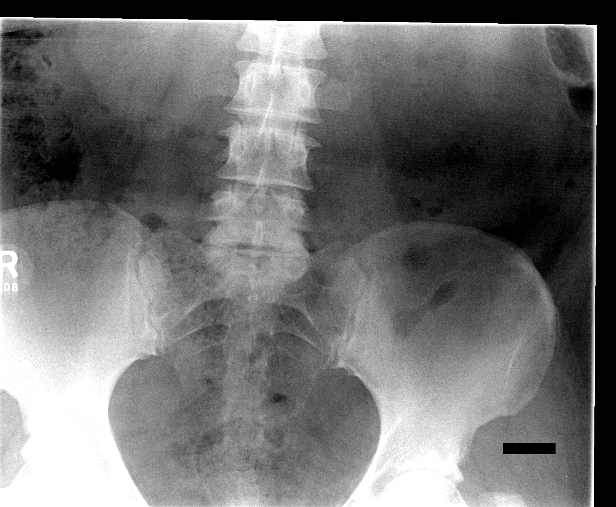

[15 of 16 positions shown; findings below may reference images not displayed]

FINDINGS: The scout radiograph shows a normal bowel gas pattern. Surgical
clips seen from prior cholecystectomy.

There is no evidence of esophageal mass or stricture. There is no
evidence of hiatal hernia, and no gastroesophageal reflux was seen
during the exam. Esophageal motility is within normal limits.

The stomach is normal in appearance. There is no evidence of gastric
masses or ulcers. Duodenal bulb and sweep are normal in appearance.
IMPRESSION: Negative upper GI series.

## 2014-07-02 ENCOUNTER — Encounter: Payer: 59 | Attending: General Surgery | Admitting: Dietician

## 2014-07-02 ENCOUNTER — Encounter: Payer: Self-pay | Admitting: Dietician

## 2014-07-02 DIAGNOSIS — Z6828 Body mass index (BMI) 28.0-28.9, adult: Secondary | ICD-10-CM | POA: Insufficient documentation

## 2014-07-02 DIAGNOSIS — Z713 Dietary counseling and surveillance: Secondary | ICD-10-CM | POA: Diagnosis not present

## 2014-07-02 NOTE — Patient Instructions (Addendum)
Goals:  Follow Phase 3B: High Protein + Non-Starchy Vegetables  Eat 3-6 small meals/snacks, every 3-5 hrs  Increase lean protein foods to meet 60g goal  Increase fluid intake to 64oz +  Avoid drinking 15 minutes before, during and 30 minutes after eating  Aim for >30 min of physical activity daily  Get some some glucose tablet to have with you  If you have hypoglycemia, have carbs and protein once you feel better (think about having crackers with peanut or cheese with you)  Think about adding weight bearing activities to help with muscle mass  Surgery date:12/23/12 Surgery type: RYGB  Starting Weight: 368 lbs on 11/04/12 Weight today: 187.5 lbs Weight change: 8 lbs Total weight lost: 180.5 lbs   TANITA  BODY COMP RESULTS  01/07/13 02/18/13 03/31/13 06/30/13 10/02/13 01/01/14 07/02/14   BMI (kg/m^2) 51.1 47.4 44.0 36.9 32.2 29.7 28.5   Fat Mass (lbs) 192.0 173.5 152.0 113.5 85 73.0 68.5   Fat Free Mass (lbs) 144.0 138.5 137.5 129.5 126.5 122.5 119.0   Total Body Water (lbs) 105.5 101.5 100.5 95.0 92.5 89.5 87.0

## 2014-07-02 NOTE — Progress Notes (Signed)
Follow-up visit:  18 months Post-Operative RYGB Surgery  Medical Nutrition Therapy:  Appt start time: 210 end time: [place] returns today with a 16 lb weight loss. Feels like things are going well. Is at goal weight. Having some reactive hypoglycemia though doesn't check blood sugar. Is getting some fruit and then peanut butter if it happens. Will have it if she has a carb and caffeine. Hasn't been happening as much as it used to.   Surgery date:12/23/12 Surgery type: RYGB  Starting Weight: 368 lbs on 11/04/12 Weight today: 187.5 lbs  Weight change: 8 lbs Total weight lost: 180.5 lbs Weight loss goal:  175 lbs  TANITA  BODY COMP RESULTS  01/07/13 02/18/13 03/31/13 06/30/13 10/02/13 01/01/14 07/02/14   BMI (kg/m^2) 51.1 47.4 44.0 36.9 32.2 29.7 28.5   Fat Mass (lbs) 192.0 173.5 152.0 113.5 85 73.0 68.5   Fat Free Mass (lbs) 144.0 138.5 137.5 129.5 126.5 122.5 119.0   Total Body Water (lbs) 105.5 101.5 100.5 95.0 92.5 89.5 87.0    Primary concerns today: Post-operative Bariatric Surgery Nutrition Management.  Preferred Learning Style:   No preference indicated   Learning Readiness:   Ready  24-hr recall: B (AM): part of protein drink (premier)  (30 g protein) Snk ( AM): cottage cheese  L (PM): chicken strips on spinach salad (15g) Snk (PM): string cheese (6g) D (PM): 4-6oz chicken or fish with green beans or pinto beans (28-42g) Snk (PM): bowl of cereal   Fluid intake: water with flavoring (68 oz), 11 oz protein shake, 20 oz regular coffee  Estimated total protein intake: 60+g   Medications:  See list  Supplementation: taking - having trouble getting in all of the calcium   Using straws: No Drinking while eating: No Hair loss: No  Carbonated beverages: No N/V/D/C: constipation sometimes  Dumping syndrome: late dumping/hypoglycemia (1 hour after eating a carb with caffeine)  Recent physical activity:  2 mile 3-4 x week walking 45-50 minutes   Progress Towards  Goal(s):  In progress.    Nutritional Diagnosis:  [place]-3.3 Overweight/obesity related to past poor dietary habits and physical inactivity as evidenced by patient w/ recent RYGB surgery following dietary guidelines for continued weight loss.    Intervention:  Nutrition education/diet enforcement.  Goals:  Follow Phase 3B: High Protein + Non-Starchy Vegetables  Eat 3-6 small meals/snacks, every 3-5 hrs  Increase lean protein foods to meet 60g goal  Increase fluid intake to 64oz +  Avoid drinking 15 minutes before, during and 30 minutes after eating  Aim for >30 min of physical activity daily  Get some some glucose tablet to have with you  If you have hypoglycemia, have carbs and protein once you feel better (think about having crackers with peanut or cheese with you)  Think about adding weight bearing activities to help with muscle mass  Teaching Method Utilized:  Visual Auditory  Barriers to learning/adherence to lifestyle change: can eat all foods including sweets  Demonstrated degree of understanding via:  Teach Back   Monitoring/Evaluation:  Dietary intake, exercise, and body weight. Follow up in 12 months for 30 month post-op visit.

## 2014-08-10 IMAGING — CR DG ABDOMEN ACUTE W/ 1V CHEST
4 series · 4 of 4 positions shown · non-contrast
Comparison: Chest radiograph performed 12/17/2012, and CT of the
abdomen and pelvis performed 03/25/2010

CLINICAL DATA: Constipation and rectal pain.

EXAM:
ACUTE ABDOMEN SERIES (ABDOMEN 2 VIEW & CHEST 1 VIEW)

[w chest pa]
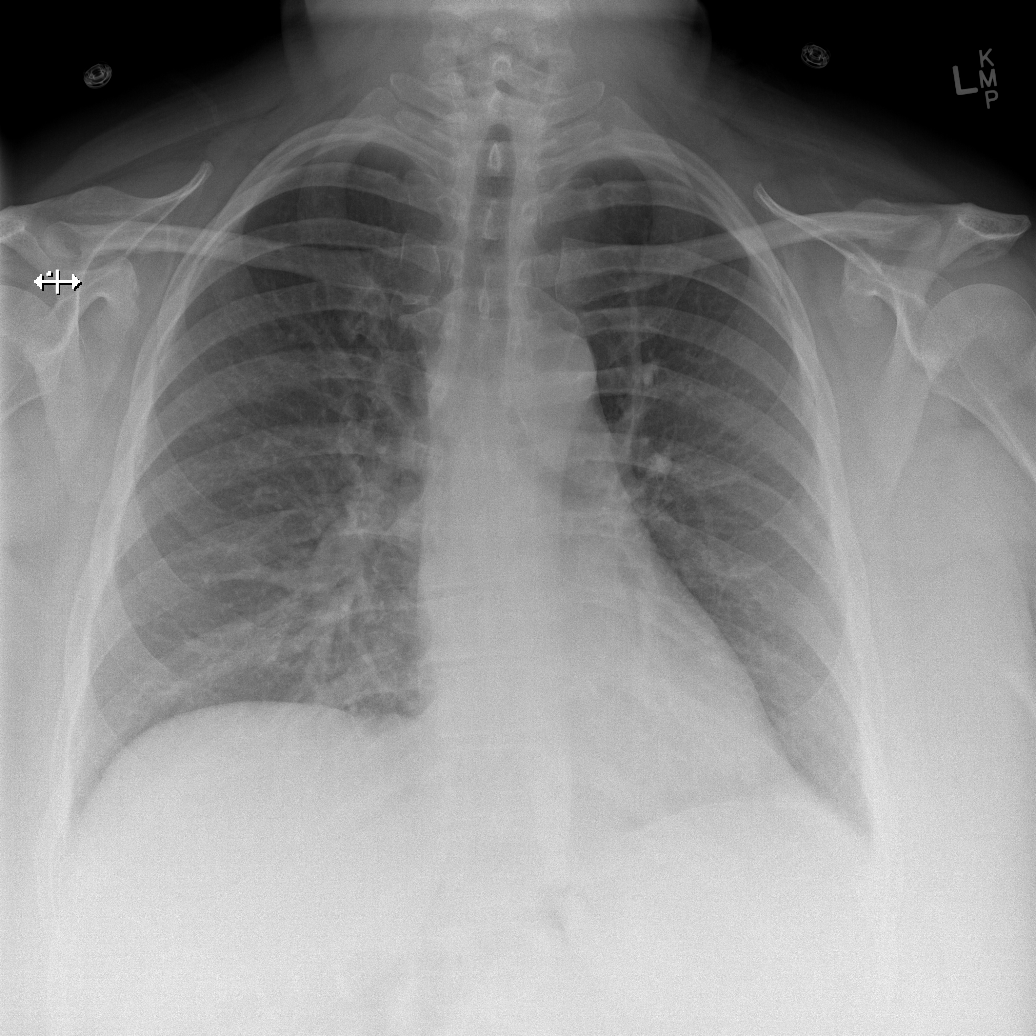

[w abdomen upright]
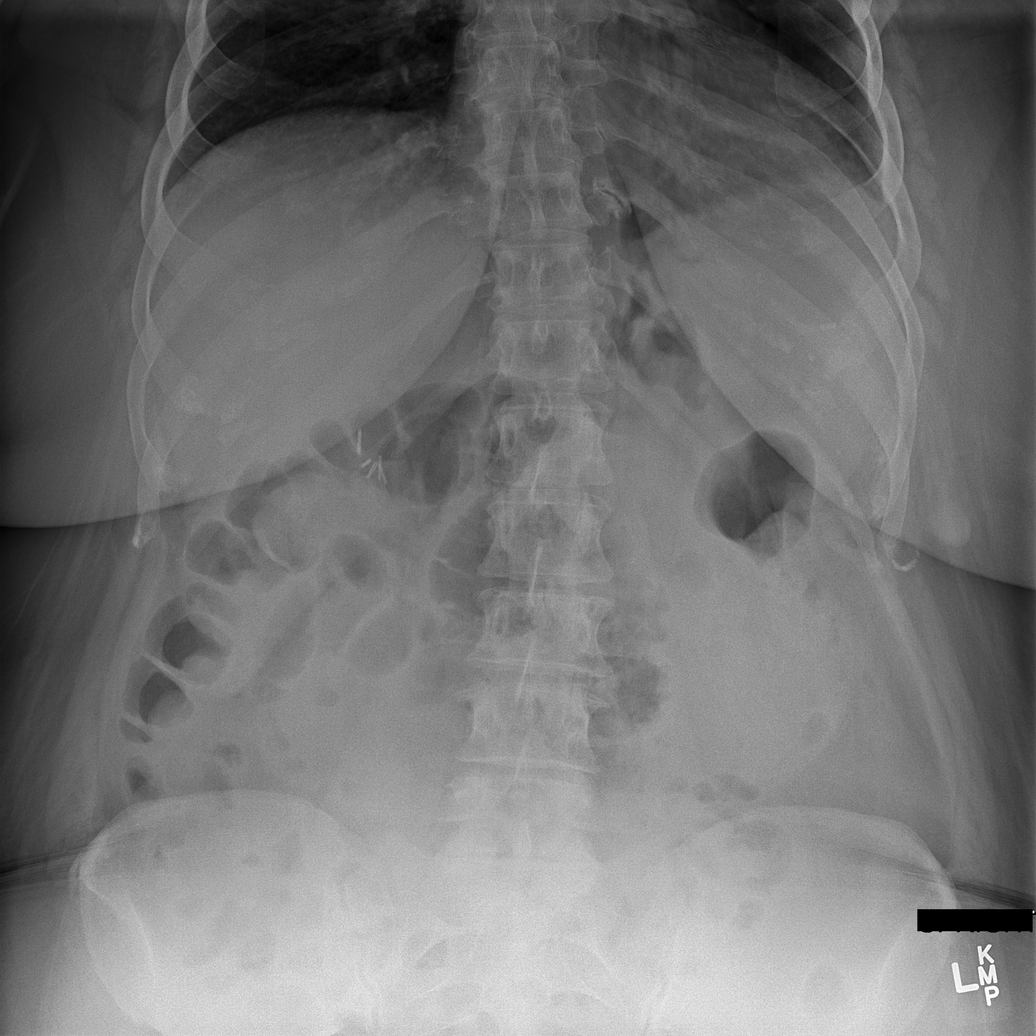

[t abdomen supine (1 of 2)]
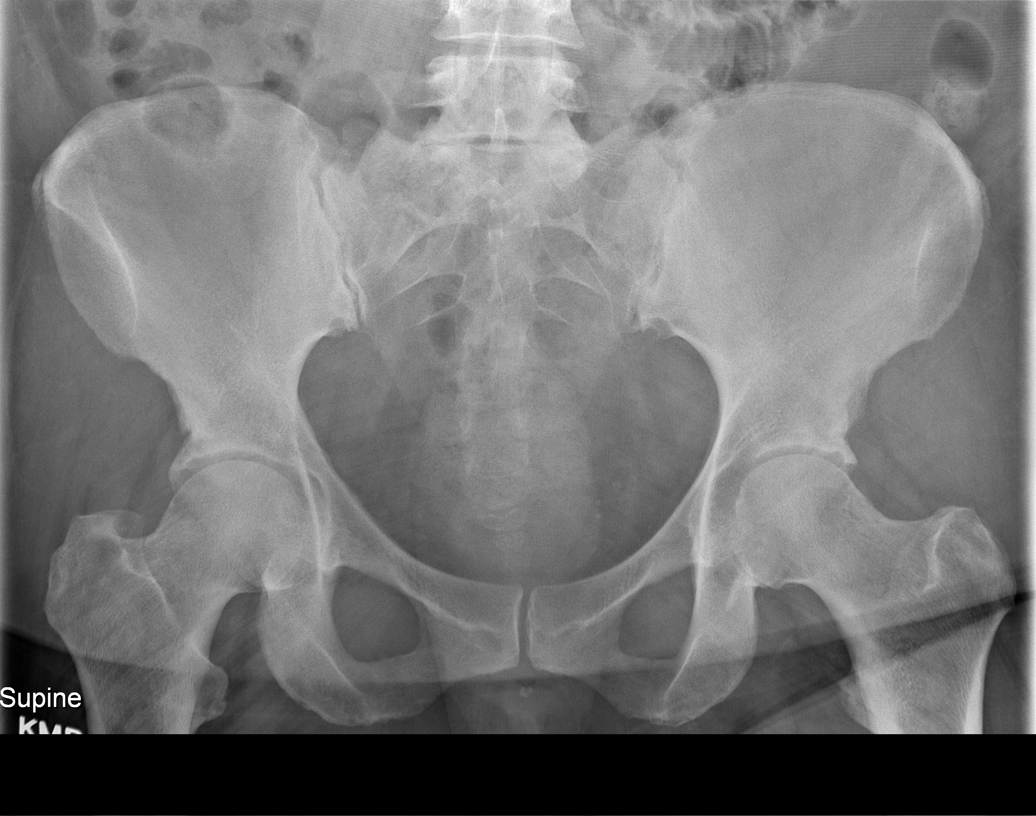

[t abdomen supine (2 of 2)]
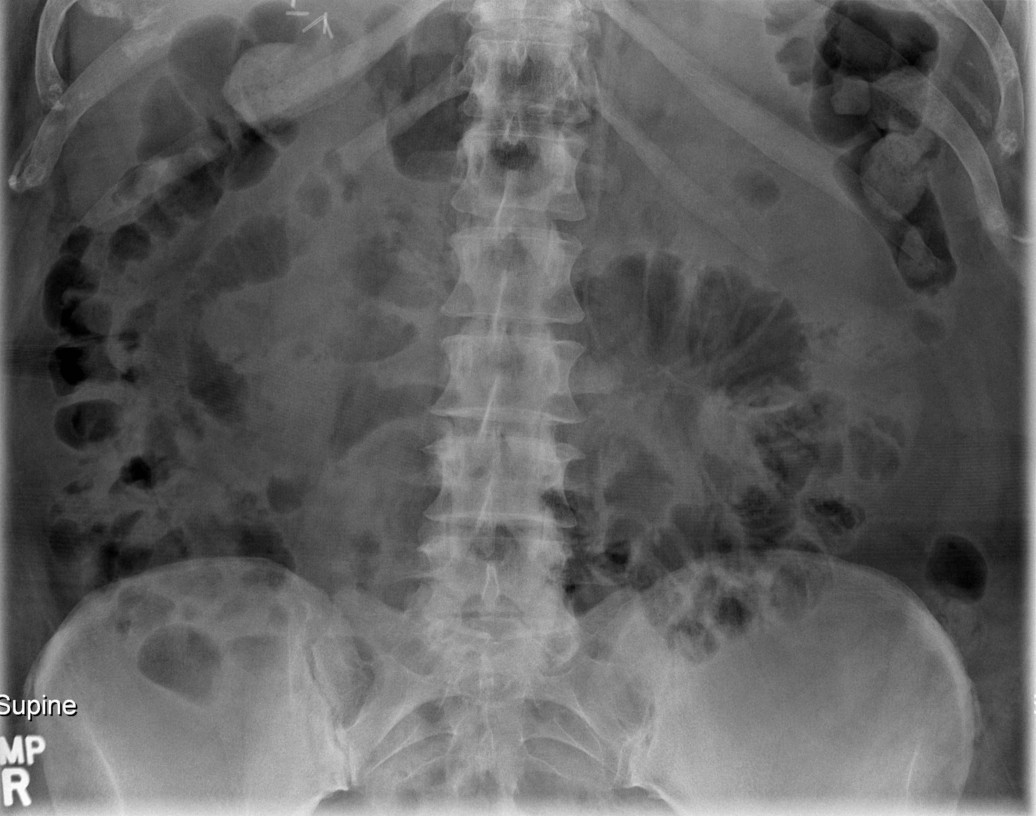

[4 of 4 positions shown; findings below may reference images not displayed]

FINDINGS: The lungs are well-aerated. Minimal left basilar atelectasis is
noted. Mild vascular congestion is seen, without definite pulmonary
edema. No pleural effusion or pneumothorax is seen. The
cardiomediastinal silhouette remains normal in size.

The visualized bowel gas pattern is unremarkable. Scattered stool
and air are seen within the colon; there is no evidence of small
bowel dilatation to suggest obstruction. No free intra-abdominal air
is identified on the provided upright view. Clips are noted within
the right upper quadrant, reflecting prior cholecystectomy.

No acute osseous abnormalities are seen; the sacroiliac joints are
unremarkable in appearance.
IMPRESSION: 1. Minimal left basilar atelectasis noted; mild vascular congestion
seen.
2. Unremarkable bowel gas pattern; no free intra-abdominal air are
identified. No significant stool burden seen.

## 2014-11-17 ENCOUNTER — Other Ambulatory Visit: Payer: Self-pay

## 2014-11-17 DIAGNOSIS — Z1231 Encounter for screening mammogram for malignant neoplasm of breast: Secondary | ICD-10-CM

## 2014-12-04 ENCOUNTER — Ambulatory Visit
Admission: RE | Admit: 2014-12-04 | Discharge: 2014-12-04 | Disposition: A | Discharge status: Home / Self Care | Payer: 59 | Source: Ambulatory Visit

## 2014-12-04 DIAGNOSIS — Z1231 Encounter for screening mammogram for malignant neoplasm of breast: Secondary | ICD-10-CM

## 2015-02-14 DIAGNOSIS — Z8639 Personal history of other endocrine, nutritional and metabolic disease: Secondary | ICD-10-CM

## 2015-02-14 HISTORY — DX: Personal history of other endocrine, nutritional and metabolic disease: Z86.39

## 2015-06-28 ENCOUNTER — Encounter: Payer: Self-pay | Admitting: Dietician

## 2015-06-28 ENCOUNTER — Encounter: Payer: BLUE CROSS/BLUE SHIELD | Attending: General Surgery | Admitting: Dietician

## 2015-06-28 DIAGNOSIS — Z9884 Bariatric surgery status: Secondary | ICD-10-CM | POA: Insufficient documentation

## 2015-06-28 DIAGNOSIS — K909 Intestinal malabsorption, unspecified: Secondary | ICD-10-CM | POA: Diagnosis not present

## 2015-06-28 NOTE — Progress Notes (Signed)
Follow-up visit:  2.5 Years Post-Operative RYGB Surgery  Medical Nutrition Therapy:  Appt start time: 215 end time: 250  Asilee returns today with a 1 lb weight gain. No longer using CPAP. Weight is up and down and little but maintaining for the most part. Having "everything in moderation". Feels like she is "'addicted to coffee". Will have hypoglycemia if she has caffeine with a carb. Is getting some fruit and then peanut butter if it happens. Or will take a glucose pill. Has episode every 2 months but has learned how to managed it. Stays away from alcohol.   Having trouble with eggs. Tries to not do a lot of red meat. Uses full fat ingredients. Can eat a full sandwich. Still avoids rice and pasta.   Working from home now and less active.   Glucose intolerance from PCOS is a lot better.   Surgery date:12/23/12 Surgery type: RYGB  Starting Weight: 368 lbs on 11/04/12 Weight today: 188.8 lbs  Weight change: 1 lb gain Total weight lost: 180.5 lbs Weight loss goal:  175 lbs  TANITA  BODY COMP RESULTS  01/07/13 02/18/13 03/31/13 06/30/13 10/02/13 01/01/14 07/02/14 06/28/15   BMI (kg/m^2) 51.1 47.4 44.0 36.9 32.2 29.7 28.5 28.7   Fat Mass (lbs) 192.0 173.5 152.0 113.5 85 73.0 68.5 74.5   Fat Free Mass (lbs) 144.0 138.5 137.5 129.5 126.5 122.5 119.0 114.2   Total Body Water (lbs) 105.5 101.5 100.5 95.0 92.5 89.5 87.0 81.4    Primary concerns today: Post-operative Bariatric Surgery Nutrition Management.  Preferred Learning Style:   No preference indicated   Learning Readiness:   Ready  24-hr recall: B (AM): part of protein drink (premier)  (30 g protein) Snk ( AM): cottage cheese  L (PM): sandwich with 3 pieces of [place] and 2 pieces of cheese (3.5 oz) (24g) Snk (PM): string cheese (6g) D (PM): 4-6oz chicken or fish with green beans or pinto beans sometimes with potato or other carbs. (28-42g) Snk (PM): half mini bagel with banana and peanut butter (who has if she has  hypoglycemia)  Fluid intake: water with flavoring (34 oz), 8 oz protein shake, 30 oz regular coffee (1/2 decaf) (around 72 oz) Estimated total protein intake: 60+g   Medications:  See list  Supplementation: taking - takes 1 calcium and eat a lot of dairy, doesn't take B12 everyday since it was high   Using straws: No Drinking while eating: No Hair loss: No  Carbonated beverages: No N/V/D/C: constipation sometimes and takes stool softeners Dumping syndrome: late dumping/hypoglycemia (1 hour after eating a carb with caffeine)  Recent physical activity:  2 miles 7 x week walking 45-50 minutes   Progress Towards Goal(s):  In progress.    Nutritional Diagnosis:  [place]-3.3 Overweight/obesity related to past poor dietary habits and physical inactivity as evidenced by patient w/ recent RYGB surgery following dietary guidelines for continued weight loss.    Intervention:  Nutrition education/diet enforcement.  Goals:  Follow Phase 3B: High Protein + Non-Starchy Vegetables  Eat 3-6 small meals/snacks, every 3-5 hrs  Increase lean protein foods to meet 60g goal  Increase fluid intake to 64oz +  Avoid drinking 15 minutes before, during and 30 minutes after eating  Aim for >30 min of physical activity daily  Think about adding weight bearing activities to help with muscle mass  Continue doing regular exercise  Teaching Method Utilized:  Visual Auditory  Barriers to learning/adherence to lifestyle change: can eat all foods including sweets  Demonstrated  degree of understanding via:  Teach Back   Monitoring/Evaluation:  Dietary intake, exercise, and body weight. Follow up in 12 months for 3.5 year post-op visit.

## 2015-06-28 NOTE — Patient Instructions (Addendum)
Goals:  Follow Phase 3B: High Protein + Non-Starchy Vegetables  Eat 3-6 small meals/snacks, every 3-5 hrs  Increase lean protein foods to meet 60g goal  Increase fluid intake to 64oz +  Avoid drinking 15 minutes before, during and 30 minutes after eating  Aim for >30 min of physical activity daily  Think about adding weight bearing activities to help with muscle mass  Continue doing regular exercise  Surgery date:12/23/12 Surgery type: RYGB  Starting Weight: 368 lbs on 11/04/12 Weight today: 188.8 lbs  Weight change: 1 lb gain Total weight lost: 180.5 lbs Weight loss goal:  175 lbs  TANITA  BODY COMP RESULTS  01/07/13 02/18/13 03/31/13 06/30/13 10/02/13 01/01/14 07/02/14 06/28/15   BMI (kg/m^2) 51.1 47.4 44.0 36.9 32.2 29.7 28.5 28.7   Fat Mass (lbs) 192.0 173.5 152.0 113.5 85 73.0 68.5 74.5   Fat Free Mass (lbs) 144.0 138.5 137.5 129.5 126.5 122.5 119.0 114.2   Total Body Water (lbs) 105.5 101.5 100.5 95.0 92.5 89.5 87.0 81.4

## 2015-10-15 DIAGNOSIS — R55 Syncope and collapse: Secondary | ICD-10-CM

## 2015-10-15 HISTORY — DX: Syncope and collapse: R55

## 2015-10-20 ENCOUNTER — Emergency Department (HOSPITAL_COMMUNITY): Payer: BLUE CROSS/BLUE SHIELD

## 2015-10-20 ENCOUNTER — Observation Stay (HOSPITAL_COMMUNITY)
Admission: EM | Admit: 2015-10-20 | Discharge: 2015-10-21 | Disposition: A | Discharge status: Home / Self Care | Payer: BLUE CROSS/BLUE SHIELD | Attending: Internal Medicine | Admitting: Internal Medicine

## 2015-10-20 ENCOUNTER — Encounter (HOSPITAL_COMMUNITY): Payer: Self-pay

## 2015-10-20 DIAGNOSIS — G4733 Obstructive sleep apnea (adult) (pediatric): Secondary | ICD-10-CM | POA: Diagnosis not present

## 2015-10-20 DIAGNOSIS — G4730 Sleep apnea, unspecified: Secondary | ICD-10-CM | POA: Diagnosis not present

## 2015-10-20 DIAGNOSIS — Z87891 Personal history of nicotine dependence: Secondary | ICD-10-CM | POA: Diagnosis not present

## 2015-10-20 DIAGNOSIS — R55 Syncope and collapse: Secondary | ICD-10-CM | POA: Diagnosis not present

## 2015-10-20 DIAGNOSIS — F329 Major depressive disorder, single episode, unspecified: Secondary | ICD-10-CM | POA: Diagnosis not present

## 2015-10-20 DIAGNOSIS — Z6828 Body mass index (BMI) 28.0-28.9, adult: Secondary | ICD-10-CM | POA: Insufficient documentation

## 2015-10-20 DIAGNOSIS — E876 Hypokalemia: Secondary | ICD-10-CM | POA: Diagnosis not present

## 2015-10-20 DIAGNOSIS — K219 Gastro-esophageal reflux disease without esophagitis: Secondary | ICD-10-CM | POA: Diagnosis present

## 2015-10-20 DIAGNOSIS — E669 Obesity, unspecified: Secondary | ICD-10-CM | POA: Diagnosis not present

## 2015-10-20 DIAGNOSIS — E282 Polycystic ovarian syndrome: Secondary | ICD-10-CM | POA: Insufficient documentation

## 2015-10-20 DIAGNOSIS — I10 Essential (primary) hypertension: Secondary | ICD-10-CM | POA: Diagnosis not present

## 2015-10-20 DIAGNOSIS — Z9884 Bariatric surgery status: Secondary | ICD-10-CM

## 2015-10-20 HISTORY — DX: Syncope and collapse: R55

## 2015-10-20 LAB — URINALYSIS, ROUTINE W REFLEX MICROSCOPIC
Bilirubin Urine: NEGATIVE
GLUCOSE, UA: NEGATIVE mg/dL
Hgb urine dipstick: NEGATIVE
KETONES UR: NEGATIVE mg/dL
LEUKOCYTES UA: NEGATIVE
Nitrite: NEGATIVE
PROTEIN: NEGATIVE mg/dL
Specific Gravity, Urine: 1.022 (ref 1.005–1.030)
pH: 5.5 (ref 5.0–8.0)

## 2015-10-20 LAB — BASIC METABOLIC PANEL
Anion gap: 8 (ref 5–15)
BUN: 19 mg/dL (ref 6–20)
CALCIUM: 8.5 mg/dL — AB (ref 8.9–10.3)
CHLORIDE: 106 mmol/L (ref 101–111)
CO2: 26 mmol/L (ref 22–32)
CREATININE: 0.8 mg/dL (ref 0.44–1.00)
GFR calc Af Amer: >60 mL/min (ref >60)
Glucose, Bld: 169 mg/dL — ABNORMAL HIGH (ref 65–99)
Potassium: 3.4 mmol/L — ABNORMAL LOW (ref 3.5–5.1)
SODIUM: 140 mmol/L (ref 135–145)

## 2015-10-20 LAB — I-STAT TROPONIN, ED: Troponin i, poc: 0 ng/mL (ref 0.00–0.08)

## 2015-10-20 LAB — CBC
HCT: 41.6 % (ref 36.0–46.0)
Hemoglobin: 13.7 g/dL (ref 12.0–15.0)
MCH: 32.1 pg (ref 26.0–34.0)
MCHC: 32.9 g/dL (ref 30.0–36.0)
MCV: 97.4 fL (ref 78.0–100.0)
PLATELETS: 156 10*3/uL (ref 150–400)
RBC: 4.27 MIL/uL (ref 3.87–5.11)
RDW: 11.8 % (ref 11.5–15.5)
WBC: 8.2 10*3/uL (ref 4.0–10.5)

## 2015-10-20 LAB — I-STAT CG4 LACTIC ACID, ED: LACTIC ACID, VENOUS: 1.59 mmol/L (ref 0.5–1.9)

## 2015-10-20 LAB — D-DIMER, QUANTITATIVE: D-Dimer, Quant: 0.38 ug/mL-FEU (ref 0.00–0.50)

## 2015-10-20 LAB — PROTIME-INR
INR: 1.01
Prothrombin Time: 13.3 seconds (ref 11.4–15.2)

## 2015-10-20 LAB — GLUCOSE, CAPILLARY
Glucose-Capillary: 129 mg/dL — ABNORMAL HIGH (ref 65–99)
Glucose-Capillary: 144 mg/dL — ABNORMAL HIGH (ref 65–99)
Glucose-Capillary: 118 mg/dL — ABNORMAL HIGH (ref 65–99)

## 2015-10-20 LAB — CBG MONITORING, ED
GLUCOSE-CAPILLARY: 83 mg/dL (ref 65–99)
GLUCOSE-CAPILLARY: 117 mg/dL — AB (ref 65–99)
Glucose-Capillary: 130 mg/dL — ABNORMAL HIGH (ref 65–99)
Glucose-Capillary: 161 mg/dL — ABNORMAL HIGH (ref 65–99)
Glucose-Capillary: 98 mg/dL (ref 65–99)

## 2015-10-20 LAB — TSH: TSH: 1.083 u[IU]/mL (ref 0.350–4.500)

## 2015-10-20 MED ORDER — ONDANSETRON HCL 4 MG/2ML IJ SOLN
4.0000 mg | Freq: Four times a day (QID) | INTRAMUSCULAR | Status: DC | PRN
  Administered 2015-10-20 (×2): 4 mg via INTRAVENOUS
  Filled 2015-10-20 (×2): qty 2

## 2015-10-20 MED ORDER — VITAMIN B-12 1000 MCG PO TABS
500.0000 ug | ORAL_TABLET | Freq: Every day | ORAL | Status: DC
  Administered 2015-10-21: 500 ug via ORAL
  Filled 2015-10-20 (×2): qty 1

## 2015-10-20 MED ORDER — ACETAMINOPHEN 325 MG PO TABS: 650.0000 mg | ORAL_TABLET | Freq: Four times a day (QID) | ORAL | Status: DC | PRN

## 2015-10-20 MED ORDER — TETANUS-DIPHTH-ACELL PERTUSSIS 5-2.5-18.5 LF-MCG/0.5 IM SUSP
0.5000 mL | Freq: Once | INTRAMUSCULAR | Status: AC
  Administered 2015-10-20: 0.5 mL via INTRAMUSCULAR
  Filled 2015-10-20: qty 0.5

## 2015-10-20 MED ORDER — DOCUSATE SODIUM 50 MG/5ML PO LIQD: 100.0000 mg | Freq: Two times a day (BID) | ORAL | Status: DC | PRN

## 2015-10-20 MED ORDER — ONDANSETRON HCL 4 MG PO TABS: 4.0000 mg | ORAL_TABLET | Freq: Four times a day (QID) | ORAL | Status: DC | PRN

## 2015-10-20 MED ORDER — SODIUM CHLORIDE 0.9 % IV SOLN
INTRAVENOUS | Status: AC
  Administered 2015-10-20: 14:00:00 via INTRAVENOUS

## 2015-10-20 MED ORDER — PANTOPRAZOLE SODIUM 40 MG PO TBEC
40.0000 mg | DELAYED_RELEASE_TABLET | Freq: Every day | ORAL | Status: DC
  Administered 2015-10-20 – 2015-10-21 (×2): 40 mg via ORAL
  Filled 2015-10-20 (×2): qty 1

## 2015-10-20 MED ORDER — PANTOPRAZOLE SODIUM 40 MG PO TBEC: 40.0000 mg | DELAYED_RELEASE_TABLET | Freq: Every day | ORAL | Status: DC | PRN

## 2015-10-20 MED ORDER — ENOXAPARIN SODIUM 40 MG/0.4ML ~~LOC~~ SOLN
40.0000 mg | SUBCUTANEOUS | Status: DC
  Administered 2015-10-20: 40 mg via SUBCUTANEOUS
  Filled 2015-10-20: qty 0.4

## 2015-10-20 MED ORDER — ACETAMINOPHEN 650 MG RE SUPP: 650.0000 mg | Freq: Four times a day (QID) | RECTAL | Status: DC | PRN

## 2015-10-20 MED ORDER — INSULIN ASPART 100 UNIT/ML ~~LOC~~ SOLN: 0.0000 [IU] | Freq: Three times a day (TID) | SUBCUTANEOUS | Status: DC

## 2015-10-20 MED ORDER — LOPERAMIDE HCL 2 MG PO CAPS
4.0000 mg | ORAL_CAPSULE | Freq: Once | ORAL | Status: AC
  Administered 2015-10-20: 4 mg via ORAL
  Filled 2015-10-20: qty 2

## 2015-10-20 MED ORDER — SODIUM CHLORIDE 0.9 % IV BOLUS (SEPSIS)
1000.0000 mL | Freq: Once | INTRAVENOUS | Status: AC
  Administered 2015-10-20: 1000 mL via INTRAVENOUS

## 2015-10-20 MED ORDER — SODIUM CHLORIDE 0.9% FLUSH
3.0000 mL | Freq: Two times a day (BID) | INTRAVENOUS | Status: DC
  Administered 2015-10-21: 3 mL via INTRAVENOUS

## 2015-10-20 NOTE — H&P (Signed)
History and Physical    RANDEE BIERLY [id number] DOB: [birth year] DOA: 10/20/2015  PCP: Vidal Schwalbe, MD Patient coming from: home  Chief Complaint: syncope  HPI: DEBY PERRIER is a pleasant 50 y.o. female with medical history significant for obesity status post gastric bypass 2014 with subsequent dumping syndrome and reactive hypoglycemia, hypertension, anemia, GERD, presents to emergency Department chief complaint syncope.  Information is obtained from the patient. She reports being in her usual state of health with the exception of some mild frequency of urination until early a.m. when she awakened and felt "shaky". She got up to go to the bathroom and her next memory is waken up on the floor. She is not sure if she made to the toilet and was urinating but she "feels" like she did. She called out for her husband ate some glucose tabs and EMS was called. Since her arrival to the emergency department she's also developed some nausea without emesis. She also reports perhaps not drinking her normal amount yesterday but she denies headache visual disturbances chest pain palpitation shortness of breath. She denies abdominal pain diarrhea constipation dysuria hematuria urgency. She denies melena or bright red blood per rectum fever chills recent travel or sick contacts.   ED Course: In the emergency department she's afebrile hemodynamically stable and not hypoxic.  Review of Systems: As per HPI otherwise 10 point review of systems negative.   Ambulatory Status: Patient ambulates with steady gait no recent falls  Past Medical History:  Diagnosis Date  . Acid reflux 07/22/2012   much improved with Bariatric diet  . Anemia    Hgb 5 02/18/10, blood transfusion 02/18/10  . Cervical radiculopathy at C6 01/25/2011  . Cystocele   . Depression   . Dysfunctional uterine bleeding    resolved after  Hysterectomy(hx. fibroids)  . Hypertension   . Lateral epicondylitis  of elbow 02/01/2011  . Morbid  obesity ([place])   . Obesity, Class III, BMI 40-49.9 (morbid obesity) ([place])   . OSA on CPAP    cpap used nightly, settings 13  . PCOS (polycystic ovarian syndrome)   . PONV (postoperative nausea and vomiting)   . Rectocele    stress incontinence    Past Surgical History:  Procedure Laterality Date  . ABDOMINAL HYSTERECTOMY    . CHOLECYSTECTOMY  1993   laparoscopic  . GASTRIC ROUX-EN-Y N/A 12/23/2012   Procedure: LAPAROSCOPIC ROUX-EN-Y GASTRIC BYPASS WITH UPPER ENDOSCOPY;  Surgeon: Madilyn Hook, DO;  Location: [facility];  Service: General;  Laterality: N/A;  . UPPER GI ENDOSCOPY N/A 12/23/2012   Procedure: UPPER GI ENDOSCOPY;  Surgeon: Madilyn Hook, DO;  Location: [facility];  Service: General;  Laterality: N/A;  . [place] History   Social History  . Marital status: Married    Spouse name: Theora Deprimo  . Number of children: 2  . Years of education: highschool   Occupational History  . 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 Smoking status: Former Smoker    Types: Cigarettes    Quit date: 02/14/1995  . Smokeless tobacco: Never Used  . Alcohol use No  . Drug use: No  . Sexual activity: Yes    Partners: Male    Birth control/ protection: Other-see comments     Comment: hysterectomy   Other Topics Concern  . Not on file   Social History Narrative  . No narrative on file  She lives at home with her husband. She works out of the home. No smoking or drinking No Known Allergies  Family History  Problem Relation Age of Onset  . Hyperlipidemia Mother   . Hypertension Mother   . Hyperlipidemia Father   . Hypertension Father   . Heart attack Father 72    5-bypass surgery  . Heart disease Father   . Cancer Paternal Uncle     colon  . Colon cancer Maternal Uncle   . Diabetes Maternal Aunt     Prior to Admission medications   Medication Sig Start Date End Date Taking? Authorizing Provider    Cyanocobalamin (VITAMIN B-12 PO) Take 1 tablet by mouth daily.   Yes Historical Provider, MD  estradiol (ESTRACE) 1 MG tablet Take 1 mg by mouth daily.  06/25/13  Yes Historical Provider, MD  Multiple Vitamin (MULTI-VITAMIN PO) Take 1 tablet by mouth daily.   Yes Historical Provider, MD  pantoprazole (PROTONIX) 40 MG tablet Take 1 tablet (40 mg total) by mouth daily as needed (heartburn). 06/30/13  Yes Excell Seltzer, MD  omeprazole (PRILOSEC) 20 MG capsule Take 20 mg by mouth daily as needed (heart burn). 01/13/11 12/12/12  Jola Schmidt, MD    Physical Exam: Vitals:   10/20/15 1007 10/20/15 1015 10/20/15 1030 10/20/15 1138  BP: 111/59 115/63 108/72 106/79  Pulse: 90 92 89 91  Resp: 18 18 20 20   Temp:      TempSrc:      SpO2: 99% 100% 98% 96%  Weight:      Height:         General:  Appears calm and comfortable, somewhat lethargic slow to respond Eyes:  PERRL, EOMI, normal lids, iris ENT:  grossly normal hearing, lips & tongue, mmm Neck:  no LAD, masses or thyromegaly Cardiovascular:  RRR, no m/r/g. No LE edema.  Respiratory:  CTA bilaterally, no w/r/r. Normal respiratory effort. Abdomen:  soft, ntnd, no guarding or rebounding positive bowel sounds Skin:  no rash or induration seen on limited exam, small abrasion on right side of face just above eyebrow Musculoskeletal:  grossly normal tone BUE/BLE, good ROM, no bony abnormality Psychiatric:  grossly normal mood and affect, speech fluent and appropriate, AOx3 Neurologic:  CN 2-12 grossly intact, moves all extremities in coordinated fashion, sensation intact, bilateral grip 5 out of 5 lower extremity strength 5 out of 5  Labs on Admission: I have personally reviewed following labs and imaging studies  CBC:  Recent Labs Lab 10/20/15 0850  WBC 8.2  HGB 13.7  HCT 41.6  MCV 97.4  PLT [id number]   Basic Metabolic Panel:  Recent Labs Lab 10/20/15 0850  NA 140  K 3.4*  CL 106  CO2 26  GLUCOSE 169*  BUN 19  CREATININE 0.80   CALCIUM 8.5*   GFR: Estimated Creatinine Clearance: 96.4 mL/min (by C-G formula based on SCr of 0.8 mg/dL). Liver Function Tests: No results for input(s): AST, ALT, ALKPHOS, BILITOT, PROT, ALBUMIN in the last 168 hours. No results for input(s): LIPASE, AMYLASE in the last 168 hours. No results for input(s): AMMONIA in the last 168 hours. Coagulation Profile:  Recent Labs Lab 10/20/15 0850  INR 1.01   Cardiac Enzymes: No results for input(s): CKTOTAL, CKMB, CKMBINDEX, TROPONINI in the last 168 hours. BNP (last 3 results) No results for input(s): PROBNP in the last 8760 hours. HbA1C: No results for input(s): HGBA1C in the last 72 hours. CBG:  Recent Labs Lab 10/20/15 0845 10/20/15 0911 10/20/15  1022 10/20/15 1057 10/20/15 1155  GLUCAP 161* 130* 83 98 117*   Lipid Profile: No results for input(s): CHOL, HDL, LDLCALC, TRIG, CHOLHDL, LDLDIRECT in the last 72 hours. Thyroid Function Tests: No results for input(s): TSH, T4TOTAL, FREET4, T3FREE, THYROIDAB in the last 72 hours. Anemia Panel: No results for input(s): VITAMINB12, FOLATE, FERRITIN, TIBC, IRON, RETICCTPCT in the last 72 hours. Urine analysis:    Component Value Date/Time   COLORURINE YELLOW 10/20/2015 1035   APPEARANCEUR CLEAR 10/20/2015 1035   LABSPEC 1.022 10/20/2015 1035   PHURINE 5.5 10/20/2015 1035   GLUCOSEU NEGATIVE 10/20/2015 1035   HGBUR NEGATIVE 10/20/2015 1035   BILIRUBINUR NEGATIVE 10/20/2015 1035   BILIRUBINUR neg 03/20/2012 1654   KETONESUR NEGATIVE 10/20/2015 1035   PROTEINUR NEGATIVE 10/20/2015 1035   UROBILINOGEN 0.2 03/20/2012 1654   UROBILINOGEN 0.2 09/11/2011 1513   NITRITE NEGATIVE 10/20/2015 1035   LEUKOCYTESUR NEGATIVE 10/20/2015 1035    Creatinine Clearance: Estimated Creatinine Clearance: 96.4 mL/min (by C-G formula based on SCr of 0.8 mg/dL).  Sepsis Labs: @LABRCNTIP (procalcitonin:4,lacticidven:4) )No results found for this or any previous visit (from the past 240 hour(s)).    Radiological Exams on Admission: Dg Chest 2 View  Result Date: 10/20/2015 CLINICAL DATA:  Syncope this morning.  Fall. EXAM: CHEST  2 VIEW COMPARISON:  12/28/2012 FINDINGS: Heart is upper limits normal in size. Lungs are clear. No effusions or acute bony abnormality. No pneumothorax. IMPRESSION: No active cardiopulmonary disease. Electronically Signed   By: Rolm Baptise M.D.   On: 10/20/2015 10:09   Ct Head Wo Contrast  Result Date: 10/20/2015 CLINICAL DATA:  50 year old female with history of syncopal event in the bathroom, with reported injury to her head in the right frontal region. Small laceration above the right eyebrow. Nausea. EXAM: CT HEAD WITHOUT CONTRAST CT CERVICAL SPINE WITHOUT CONTRAST TECHNIQUE: Multidetector CT imaging of the head and cervical spine was performed following the standard protocol without intravenous contrast. Multiplanar CT image reconstructions of the cervical spine were also generated. COMPARISON:  No priors. FINDINGS: CT HEAD FINDINGS No acute displaced skull fractures are identified. No acute intracranial abnormality. Specifically, no evidence of acute post-traumatic intracranial hemorrhage, no definite regions of acute/subacute cerebral ischemia, no focal mass, mass effect, hydrocephalus or abnormal intra or extra-axial fluid collections. The visualized paranasal sinuses and mastoids are well pneumatized. CT CERVICAL SPINE FINDINGS No acute displaced fractures of the cervical spine. Alignment is anatomic. Prevertebral soft tissues are normal. Mild multilevel degenerative disc disease, most severe at C5-C6. Visualized portions of the upper thorax are unremarkable. IMPRESSION: 1. No evidence of significant acute traumatic injury to the skull, brain or cervical spine. 2. The appearance of the brain is normal. 3. Mild multilevel degenerative disc disease, most severe at C5-C6. Electronically Signed   By: Vinnie Langton M.D.   On: 10/20/2015 09:46   Ct Cervical Spine Wo  Contrast  Result Date: 10/20/2015 CLINICAL DATA:  50 year old female with history of syncopal event in the bathroom, with reported injury to her head in the right frontal region. Small laceration above the right eyebrow. Nausea. EXAM: CT HEAD WITHOUT CONTRAST CT CERVICAL SPINE WITHOUT CONTRAST TECHNIQUE: Multidetector CT imaging of the head and cervical spine was performed following the standard protocol without intravenous contrast. Multiplanar CT image reconstructions of the cervical spine were also generated. COMPARISON:  No priors. FINDINGS: CT HEAD FINDINGS No acute displaced skull fractures are identified. No acute intracranial abnormality. Specifically, no evidence of acute post-traumatic intracranial hemorrhage, no definite regions of acute/subacute  cerebral ischemia, no focal mass, mass effect, hydrocephalus or abnormal intra or extra-axial fluid collections. The visualized paranasal sinuses and mastoids are well pneumatized. CT CERVICAL SPINE FINDINGS No acute displaced fractures of the cervical spine. Alignment is anatomic. Prevertebral soft tissues are normal. Mild multilevel degenerative disc disease, most severe at C5-C6. Visualized portions of the upper thorax are unremarkable. IMPRESSION: 1. No evidence of significant acute traumatic injury to the skull, brain or cervical spine. 2. The appearance of the brain is normal. 3. Mild multilevel degenerative disc disease, most severe at C5-C6. Electronically Signed   By: Vinnie Langton M.D.   On: 10/20/2015 09:46    EKG: Independently reviewed. Sinus rhythm Prolonged PR interval Right axis deviation Low voltage, precordial leads  Assessment/Plan Principal Problem:   Syncope Active Problems:   Hypertension   Morbid obesity ([place])   Sleep apnea   Acid reflux   S/P gastric bypass   Hypokalemia   #1. Syncope. Etiology unclear. Has a history of reactive hypoglycemia since gastric bypass surgery vs vagal response vs orthostatic hypotension in  setting dehydration.  Head CT without acute abnormality. No sign of infectious process. No metabolic derangement. EKG without acute changes. -Admit to telemetry -Obtain orthostatic vital signs -Echocardiogram -IV fluids -Monitor CBG -Carb modified diet  #2. Hypertension. Patient with a history of hypertension but not on any antihypertensives as weight loss. Blood pressure on the low end of normal range. -IV fluids as noted above -Monitor closely  #3. Hypokalemia. Mild. Potassium level III.4 -Replete and recheck  #4. Obstructive sleep apnea. Wears cpap at home -respiratory consult  #5. GERD. Stable at baseline -Continue PPI    DVT prophylaxis: scd  Code Status: full  Family Communication: husband at bedside  Disposition Plan: home  Consults called: none  Admission status: obs    Radene Gunning MD [facility]  If 7PM-7AM, please contact night-coverage [email or web address] Password [email or web address]  10/20/2015, 11:59 AM

## 2015-10-20 NOTE — ED Notes (Signed)
C collar removed by Dr. Thomasene Lot

## 2015-10-20 NOTE — ED Notes (Signed)
Dr. Mackuen at bedside  

## 2015-10-20 NOTE — ED Provider Notes (Signed)
[facility] DEPT Provider Note   CSN: [id number] Arrival date & time: 10/20/15  [id number]     History   Chief Complaint Chief Complaint  Patient presents with  . Loss of Consciousness    HPI [name] is a 50 y.o. female.  The history is provided by the patient.  Loss of Consciousness   This is a new problem. The problem occurs rarely. The problem has not changed since onset.Length of episode of loss of consciousness: unkown time. Associated with: urinating. Associated symptoms include diaphoresis and weakness. Pertinent negatives include chest pain, dizziness, headaches, light-headedness and visual change. Treatments tried: glucose tabs. The treatment provided moderate relief. Her past medical history does not include CVA or TIA. Past medical history comments: history of reactive hypoglycemia from dumping syndrome..    Past Medical History:  Diagnosis Date  . Acid reflux 07/22/2012   much improved with Bariatric diet  . Anemia    Hgb 5 02/18/10, blood transfusion 02/18/10  . Cervical radiculopathy at C6 01/25/2011  . Cystocele   . Depression   . Dysfunctional uterine bleeding    resolved after  Hysterectomy(hx. fibroids)  . Hypertension   . Lateral epicondylitis  of elbow 02/01/2011  . Morbid obesity ([place])   . Obesity, Class III, BMI 40-49.9 (morbid obesity) ([place])   . OSA on CPAP    cpap used nightly, settings 13  . PCOS (polycystic ovarian syndrome)   . PONV (postoperative nausea and vomiting)   . Rectocele    stress incontinence    Patient Active Problem List   Diagnosis Date Noted  . S/P gastric bypass 10/01/2013  . Acid reflux 07/22/2012  . Blepharitis of left eye 07/17/2012  . Urinary frequency 03/20/2012  . Prepatellar bursitis of left knee 10/17/2011  . Bowel habit changes 04/21/2011  . Lateral femoral cutaneous neuropathy 02/08/2011  . Lateral epicondylitis  of elbow 02/01/2011  . Cervical radiculopathy at C6 01/25/2011  . Cystocele   . Skin tag 09/29/2010   . Nevus 09/29/2010  . Urinary incontinence 09/15/2010  . Sleep apnea 08/27/2010  . Abdominal or pelvic swelling, mass or lump, unspecified site 08/13/2010  . Skin lesion, superficial 06/23/2010  . Well adult exam 03/31/2010  . PCOS (polycystic ovarian syndrome) 03/31/2010  . Hypertension 03/30/2010  . Cystocele 03/30/2010  . Rectocele 03/30/2010  . Dysfunctional uterine bleeding 03/30/2010  . Morbid obesity ([place]) 03/30/2010  . Anemia 03/30/2010    Past Surgical History:  Procedure Laterality Date  . ABDOMINAL HYSTERECTOMY    . CHOLECYSTECTOMY  1993   laparoscopic  . GASTRIC ROUX-EN-Y N/A 12/23/2012   Procedure: LAPAROSCOPIC ROUX-EN-Y GASTRIC BYPASS WITH UPPER ENDOSCOPY;  Surgeon: Madilyn Hook, DO;  Location: [facility];  Service: General;  Laterality: N/A;  . UPPER GI ENDOSCOPY N/A 12/23/2012   Procedure: UPPER GI ENDOSCOPY;  Surgeon: Madilyn Hook, DO;  Location: [facility];  Service: General;  Laterality: N/A;  . VAGINAL DELIVERY  1993    OB History    Gravida Para Term Preterm AB Living   1 1 0 0 0 1   SAB TAB Ectopic Multiple Live Births   0 0 0 0         Home Medications    Prior to Admission medications   Medication Sig Start Date End Date Taking? Authorizing Provider  cyanocobalamin 500 MCG tablet Take 500 mcg by mouth daily.    Historical Provider, MD  docusate (COLACE) 50 MG/5ML liquid Take 10 mLs (100 mg total) by mouth 2 (  two) times daily as needed for mild constipation. 12/28/12   Madilyn Hook, DO  estradiol (ESTRACE) 1 MG tablet  06/25/13   Historical Provider, MD  omeprazole (PRILOSEC) 20 MG capsule Take 20 mg by mouth daily as needed (heart burn). 01/13/11 12/12/12  Jola Schmidt, MD  pantoprazole (PROTONIX) 40 MG tablet Take 1 tablet (40 mg total) by mouth daily as needed (heartburn). 06/30/13   Excell Seltzer, MD    Family History Family History  Problem Relation Age of Onset  . Hyperlipidemia Mother   . Hypertension Mother   . Hyperlipidemia Father   .  Hypertension Father   . Heart attack Father 28    5-bypass surgery  . Heart disease Father   . Cancer Paternal Uncle     colon  . Colon cancer Maternal Uncle   . Diabetes Maternal Aunt     Social History Social History  Substance Use Topics  . Smoking status: Former Smoker    Types: Cigarettes    Quit date: 02/14/1995  . Smokeless tobacco: Never Used  . Alcohol use No     Allergies   Review of patient's allergies indicates no known allergies.   Review of Systems Review of Systems  Constitutional: Positive for diaphoresis.  Respiratory: Negative for cough.   Cardiovascular: Positive for syncope. Negative for chest pain.  Musculoskeletal: Negative for neck pain.  Neurological: Positive for syncope and weakness. Negative for dizziness, facial asymmetry, light-headedness and headaches.     Physical Exam Updated Vital Signs BP 108/72   Pulse 89   Temp 98.3 F (36.8 C) (Oral)   Resp 20   Ht 5\' 8"  (1.727 m)   Wt 189 lb (85.7 kg)   LMP 03/21/2010   SpO2 98%   BMI 28.74 kg/m   Physical Exam  Constitutional: She is oriented to person, place, and time. She appears well-developed and well-nourished.  HENT:  Head: Normocephalic.  Abrasion to R temple  Eyes: Right eye exhibits no discharge.  Cardiovascular: Normal rate, regular rhythm and normal heart sounds.   No murmur heard. Pulmonary/Chest: Effort normal and breath sounds normal. She has no wheezes. She has no rales.  Abdominal: Soft. She exhibits no distension. There is no tenderness.  Neurological: She is oriented to person, place, and time.  sleepy  Skin: Skin is warm and dry. She is not diaphoretic.  Psychiatric: She has a normal mood and affect.  Nursing note and vitals reviewed.    ED Treatments / Results  Labs (all labs ordered are listed, but only abnormal results are displayed) Labs Reviewed  BASIC METABOLIC PANEL - Abnormal; Notable for the following:       Result Value   Potassium 3.4 (*)     Glucose, Bld 169 (*)    Calcium 8.5 (*)    All other components within normal limits  CBG MONITORING, ED - Abnormal; Notable for the following:    Glucose-Capillary 161 (*)    All other components within normal limits  CBG MONITORING, ED - Abnormal; Notable for the following:    Glucose-Capillary 130 (*)    All other components within normal limits  URINE CULTURE  CBC  PROTIME-INR  D-DIMER, QUANTITATIVE (NOT AT [facility])  URINALYSIS, ROUTINE W REFLEX MICROSCOPIC (NOT AT [facility])  I-STAT CG4 LACTIC ACID, ED  Randolm Idol, ED  CBG MONITORING, ED    EKG  EKG Interpretation  Date/Time:  Wednesday October 20 2015 08:40:26 EDT Ventricular Rate:  87 PR Interval:    QRS Duration:  98 QT Interval:  353 QTC Calculation: 425 R Axis:   113 Text Interpretation:  Sinus rhythm Prolonged PR interval Right axis deviation Low voltage, precordial leads No significant change since last tracing Confirmed by Gerald Leitz ([id number]) on 10/20/2015 9:25:51 AM       Radiology Dg Chest 2 View  Result Date: 10/20/2015 CLINICAL DATA:  Syncope this morning.  Fall. EXAM: CHEST  2 VIEW COMPARISON:  12/28/2012 FINDINGS: Heart is upper limits normal in size. Lungs are clear. No effusions or acute bony abnormality. No pneumothorax. IMPRESSION: No active cardiopulmonary disease. Electronically Signed   By: Rolm Baptise M.D.   On: 10/20/2015 10:09   Ct Head Wo Contrast  Result Date: 10/20/2015 CLINICAL DATA:  50 year old female with history of syncopal event in the bathroom, with reported injury to her head in the right frontal region. Small laceration above the right eyebrow. Nausea. EXAM: CT HEAD WITHOUT CONTRAST CT CERVICAL SPINE WITHOUT CONTRAST TECHNIQUE: Multidetector CT imaging of the head and cervical spine was performed following the standard protocol without intravenous contrast. Multiplanar CT image reconstructions of the cervical spine were also generated. COMPARISON:  No priors. FINDINGS: CT HEAD  FINDINGS No acute displaced skull fractures are identified. No acute intracranial abnormality. Specifically, no evidence of acute post-traumatic intracranial hemorrhage, no definite regions of acute/subacute cerebral ischemia, no focal mass, mass effect, hydrocephalus or abnormal intra or extra-axial fluid collections. The visualized paranasal sinuses and mastoids are well pneumatized. CT CERVICAL SPINE FINDINGS No acute displaced fractures of the cervical spine. Alignment is anatomic. Prevertebral soft tissues are normal. Mild multilevel degenerative disc disease, most severe at C5-C6. Visualized portions of the upper thorax are unremarkable. IMPRESSION: 1. No evidence of significant acute traumatic injury to the skull, brain or cervical spine. 2. The appearance of the brain is normal. 3. Mild multilevel degenerative disc disease, most severe at C5-C6. Electronically Signed   By: Vinnie Langton M.D.   On: 10/20/2015 09:46   Ct Cervical Spine Wo Contrast  Result Date: 10/20/2015 CLINICAL DATA:  50 year old female with history of syncopal event in the bathroom, with reported injury to her head in the right frontal region. Small laceration above the right eyebrow. Nausea. EXAM: CT HEAD WITHOUT CONTRAST CT CERVICAL SPINE WITHOUT CONTRAST TECHNIQUE: Multidetector CT imaging of the head and cervical spine was performed following the standard protocol without intravenous contrast. Multiplanar CT image reconstructions of the cervical spine were also generated. COMPARISON:  No priors. FINDINGS: CT HEAD FINDINGS No acute displaced skull fractures are identified. No acute intracranial abnormality. Specifically, no evidence of acute post-traumatic intracranial hemorrhage, no definite regions of acute/subacute cerebral ischemia, no focal mass, mass effect, hydrocephalus or abnormal intra or extra-axial fluid collections. The visualized paranasal sinuses and mastoids are well pneumatized. CT CERVICAL SPINE FINDINGS No acute  displaced fractures of the cervical spine. Alignment is anatomic. Prevertebral soft tissues are normal. Mild multilevel degenerative disc disease, most severe at C5-C6. Visualized portions of the upper thorax are unremarkable. IMPRESSION: 1. No evidence of significant acute traumatic injury to the skull, brain or cervical spine. 2. The appearance of the brain is normal. 3. Mild multilevel degenerative disc disease, most severe at C5-C6. Electronically Signed   By: Vinnie Langton M.D.   On: 10/20/2015 09:46    Procedures Procedures (including critical care time)  Medications Ordered in ED Medications  sodium chloride 0.9 % bolus 1,000 mL (0 mLs Intravenous Stopped 10/20/15 1023)  Tdap (BOOSTRIX) injection 0.5 mL (0.5 mLs Intramuscular Given 10/20/15 1026)  Initial Impression / Assessment and Plan / ED Course  I have reviewed the triage vital signs and the nursing notes.  Pertinent labs & imaging results that were available during my care of the patient were reviewed by me and considered in my medical decision making (see chart for details).  Clinical Course    Patietn is a pel[place] female with PMH sig for dumping syndrome and reactive hypoglycemia peresenting with LOC.  Patient awwoke from sleep and felt shaky (as she does with low BS) and went to the bathroom, passed out while on the toilet.  Ate several glucose tabs, Called for husbadn when she awoke and EMS was called.   On arrival hre, abrasion to forehead still midlldy sleepy.  Will keep close tabs on glucose, sounds like a hypoglycemic episode. Will get CT head, and work up for cardiac or other cause as well.   10:54 AM Blood sugar went from 160 to 1:15 to 83. Patient does have history of the reactive glycemia. Patient still mildly sleepy. Concern the patient might of had a seizure versus cardiac ideologyvs hypoglycemic cause of this she syncope. Will admit for syncope workup.  Final Clinical Impressions(s) / ED Diagnoses   Final  diagnoses:  None    New Prescriptions New Prescriptions   No medications on file     Ewan Grau Julio Alm, MD 10/20/15 1054

## 2015-10-20 NOTE — ED Notes (Signed)
cbg was 117

## 2015-10-20 NOTE — ED Notes (Signed)
When pt was stood up to do orthostatic vital signs that pt became nauseated and began to dry heave. The pt denied being dizzy.

## 2015-10-20 NOTE — ED Triage Notes (Signed)
Per [facility]: Pt lost consciousness while in the bathroom. Pt hit her head, but unsure on what. Pt has a small laceration above her right eyebrow. Pt is unsure of how long she was unconscious for but the blood on her head had dried when she woke up. Pt is not complaining of any neck or back pain. Pt is nauseated and had vomited 1 time. Pt has reactive hypoglycemia and when she got up she ate several glucose tablets thinking that is what was wrong with her. Pt has increased generalized weakness. EMS stated that the pt had positive orthostatic vital signs. The pts sitting BP was 148/82, standing BP was 120/80. Pupils are equal and reactive. Pt was given 4 mg of zofran with EMS.   Pt also stated that she did not go to bed until 4 am.

## 2015-10-20 NOTE — ED Notes (Addendum)
Pt placed in medium [place] Collar with assistance of [employer], RN

## 2015-10-20 NOTE — ED Notes (Addendum)
Patient transported to CT with this RN 

## 2015-10-20 NOTE — ED Notes (Signed)
cbg was 98

## 2015-10-21 DIAGNOSIS — I10 Essential (primary) hypertension: Secondary | ICD-10-CM | POA: Diagnosis not present

## 2015-10-21 DIAGNOSIS — K219 Gastro-esophageal reflux disease without esophagitis: Secondary | ICD-10-CM

## 2015-10-21 DIAGNOSIS — E876 Hypokalemia: Secondary | ICD-10-CM

## 2015-10-21 DIAGNOSIS — G4730 Sleep apnea, unspecified: Secondary | ICD-10-CM

## 2015-10-21 DIAGNOSIS — R55 Syncope and collapse: Secondary | ICD-10-CM | POA: Diagnosis not present

## 2015-10-21 LAB — CBC
HEMATOCRIT: 39.7 % (ref 36.0–46.0)
Hemoglobin: 13.1 g/dL (ref 12.0–15.0)
MCH: 31.9 pg (ref 26.0–34.0)
MCHC: 33 g/dL (ref 30.0–36.0)
MCV: 96.6 fL (ref 78.0–100.0)
Platelets: 154 10*3/uL (ref 150–400)
RBC: 4.11 MIL/uL (ref 3.87–5.11)
RDW: 11.8 % (ref 11.5–15.5)
WBC: 6.6 10*3/uL (ref 4.0–10.5)

## 2015-10-21 LAB — HEMOGLOBIN A1C
HEMOGLOBIN A1C: 5 % (ref 4.8–5.6)
MEAN PLASMA GLUCOSE: 97 mg/dL

## 2015-10-21 LAB — URINE CULTURE

## 2015-10-21 LAB — BASIC METABOLIC PANEL
Anion gap: 6 (ref 5–15)
BUN: 14 mg/dL (ref 6–20)
CHLORIDE: 109 mmol/L (ref 101–111)
CO2: 28 mmol/L (ref 22–32)
Calcium: 8.2 mg/dL — ABNORMAL LOW (ref 8.9–10.3)
Creatinine, Ser: 0.86 mg/dL (ref 0.44–1.00)
GFR calc Af Amer: >60 mL/min (ref >60)
GFR calc non Af Amer: >60 mL/min (ref >60)
GLUCOSE: 106 mg/dL — AB (ref 65–99)
POTASSIUM: 3.3 mmol/L — AB (ref 3.5–5.1)
SODIUM: 143 mmol/L (ref 135–145)

## 2015-10-21 LAB — GLUCOSE, CAPILLARY
GLUCOSE-CAPILLARY: 95 mg/dL (ref 65–99)
GLUCOSE-CAPILLARY: 96 mg/dL (ref 65–99)
Glucose-Capillary: 95 mg/dL (ref 65–99)

## 2015-10-21 NOTE — Progress Notes (Signed)
Patient BP 93/47. Notified on-call NP Schorr.  Will continue to monitor and notify NP as needed.

## 2015-10-21 NOTE — Care Management Note (Signed)
Case Management Note  Patient Details  Name: [name] MRN: [id number] Date of Birth: Apr 29, 1965  Subjective/Objective:                 Independent patient from home in obs for syncope. Expect DC to home with husband today. No CM needs identified.    Action/Plan:   Expected Discharge Date:                  Expected Discharge Plan:  Home/Self Care  In-House Referral:  NA  Discharge planning Services  CM Consult  Post Acute Care Choice:  NA Choice offered to:  NA  DME Arranged:  N/A DME Agency:  NA  HH Arranged:  NA HH Agency:  NA  Status of Service:  Completed, signed off  If discussed at [place] of Stay Meetings, dates discussed:    Additional Comments:  Carles Collet, RN 10/21/2015, 10:55 AM

## 2015-10-21 NOTE — Discharge Summary (Signed)
Physician Discharge Summary  [name] [id number] DOB: [birth year] DOA: 10/20/2015  PCP: Vidal Schwalbe, MD  Admit date: 10/20/2015 Discharge date: 10/21/2015  Time spent: 35 minutes  Recommendations for Outpatient Follow-up:  Repeat BMET to follow electrolytes and renal function  Reassess complete resolution of symptoms and if further syncope/near syncope events please arrange for outpatient event monitoring.   Discharge Diagnoses:  Principal Problem:   Syncope Active Problems:   Hypertension   Morbid obesity ([place])   Sleep apnea   Acid reflux   S/P gastric bypass   Hypokalemia   Discharge Condition: stable and improved. Discharge home with instructions to follow up with PCP in 10 days.  Diet recommendation: heart healthy diet   Filed Weights   10/20/15 0840 10/20/15 1237 10/21/15 0615  Weight: 85.7 kg (189 lb) 85.7 kg (188 lb 14.4 oz) 85.3 kg (188 lb 0.8 oz)    History of present illness:  50 y.o. female with medical history significant for obesity status post gastric bypass 2014 with subsequent dumping syndrome and reactive hypoglycemia, hypertension, anemia, GERD, presents to emergency Department chief complaint syncope.  Information is obtained from the patient. She reports being in her usual state of health with the exception of some mild frequency of urination until early a.m. when she awakened and felt "shaky". She got up to go to the bathroom and her next memory is waken up on the floor. She is not sure if she made to the toilet and was urinating but she "feels" like she did. She called out for her husband ate some glucose tabs and EMS was called. Since her arrival to the emergency department she's also developed some nausea without emesis. She also reports perhaps not drinking her normal amount yesterday but she denies headache visual disturbances chest pain palpitation shortness of breath. She denies abdominal pain diarrhea constipation dysuria hematuria urgency. She  denies melena or bright red blood per rectum fever chills recent travel or sick contacts.  Hospital Course:  #1. Syncope. Most likely due to orthostatic/vasovagal component. Also contribution from dumping syndrome a possibility. -Head CT without acute abnormality. No signs of infectious process appreciated. No metabolic derangement.  -EKG and telemetry without acute changes. -neg orthostatic vital signs after aggressive hydration -no murmurs or gallops on physical exam. Decision not to pursuit echo made -patient advise to keep herself well hydrated   #2. Hypertension. Patient with a history of hypertension but not on any antihypertensives at this point sine weight loss.  -advise to continue low sodium diet   #3. Hypokalemia. Mild. Potassium level 3.4 on admission  -Repleted   #4. Obstructive sleep apnea.  -continue CPAP QHS  #5. GERD. Stable  -Continue PPI  #6 Obesity: status post bariatric surgery -Body mass index is 28.59 kg/m. -advise to follow diet and outpatient visits with bariatric surgeon    Procedures:  See below for x-ray reports   Consultations:  None   Discharge Exam: Vitals:   10/21/15 1018 10/21/15 1428  BP: (!) 102/55 (!) 101/54  Pulse: (!) 59 (!) 51  Resp: 15 15  Temp: 98.6 F (37 C) 98.9 F (37.2 C)    General: afebrile, no CP, no SOB. Denies dizziness, lightheadedness and any further syncope/near syncope episode. Cardiovascular: S1 and S2, no rubs, no gallops, no murmur Respiratory: CTA bilaterally Abd: soft, NT, ND, positive BS Extremities: no cyanosis, FROM Neuro: no focal deficit   Discharge Instructions   Discharge Instructions    Discharge instructions    Complete by:  As directed   Keep yourself well hydrated Take medications as prescribed Arrange follow up with PCP in 10 days     Current Discharge Medication List    CONTINUE these medications which have NOT CHANGED   Details  Cyanocobalamin (VITAMIN B-12 PO) Take 1  tablet by mouth daily.    estradiol (ESTRACE) 1 MG tablet Take 1 mg by mouth daily.     Multiple Vitamin (MULTI-VITAMIN PO) Take 1 tablet by mouth daily.    pantoprazole (PROTONIX) 40 MG tablet Take 1 tablet (40 mg total) by mouth daily as needed (heartburn). Qty: 50 tablet, Refills: 2      STOP taking these medications     omeprazole (PRILOSEC) 20 MG capsule      docusate (COLACE) 50 MG/5ML liquid        No Known Allergies Follow-up Information    WHITE,CYNTHIA S, MD. Schedule an appointment as soon as possible for a visit in 10 day(s).   Specialty:  Family Medicine Contact information: [address] [place] [address] [phone]            The results of significant diagnostics from this hospitalization (including imaging, microbiology, ancillary and laboratory) are listed below for reference.    Significant Diagnostic Studies: Dg Chest 2 View  Result Date: 10/20/2015 CLINICAL DATA:  Syncope this morning.  Fall. EXAM: CHEST  2 VIEW COMPARISON:  12/28/2012 FINDINGS: Heart is upper limits normal in size. Lungs are clear. No effusions or acute bony abnormality. No pneumothorax. IMPRESSION: No active cardiopulmonary disease. Electronically Signed   By: Rolm Baptise M.D.   On: 10/20/2015 10:09   Ct Head Wo Contrast  Result Date: 10/20/2015 CLINICAL DATA:  50 year old female with history of syncopal event in the bathroom, with reported injury to her head in the right frontal region. Small laceration above the right eyebrow. Nausea. EXAM: CT HEAD WITHOUT CONTRAST CT CERVICAL SPINE WITHOUT CONTRAST TECHNIQUE: Multidetector CT imaging of the head and cervical spine was performed following the standard protocol without intravenous contrast. Multiplanar CT image reconstructions of the cervical spine were also generated. COMPARISON:  No priors. FINDINGS: CT HEAD FINDINGS No acute displaced skull fractures are identified. No acute intracranial abnormality.  Specifically, no evidence of acute post-traumatic intracranial hemorrhage, no definite regions of acute/subacute cerebral ischemia, no focal mass, mass effect, hydrocephalus or abnormal intra or extra-axial fluid collections. The visualized paranasal sinuses and mastoids are well pneumatized. CT CERVICAL SPINE FINDINGS No acute displaced fractures of the cervical spine. Alignment is anatomic. Prevertebral soft tissues are normal. Mild multilevel degenerative disc disease, most severe at C5-C6. Visualized portions of the upper thorax are unremarkable. IMPRESSION: 1. No evidence of significant acute traumatic injury to the skull, brain or cervical spine. 2. The appearance of the brain is normal. 3. Mild multilevel degenerative disc disease, most severe at C5-C6. Electronically Signed   By: Vinnie Langton M.D.   On: 10/20/2015 09:46   Ct Cervical Spine Wo Contrast  Result Date: 10/20/2015 CLINICAL DATA:  50 year old female with history of syncopal event in the bathroom, with reported injury to her head in the right frontal region. Small laceration above the right eyebrow. Nausea. EXAM: CT HEAD WITHOUT CONTRAST CT CERVICAL SPINE WITHOUT CONTRAST TECHNIQUE: Multidetector CT imaging of the head and cervical spine was performed following the standard protocol without intravenous contrast. Multiplanar CT image reconstructions of the cervical spine were also generated. COMPARISON:  No priors. FINDINGS: CT HEAD FINDINGS No acute displaced skull fractures are  identified. No acute intracranial abnormality. Specifically, no evidence of acute post-traumatic intracranial hemorrhage, no definite regions of acute/subacute cerebral ischemia, no focal mass, mass effect, hydrocephalus or abnormal intra or extra-axial fluid collections. The visualized paranasal sinuses and mastoids are well pneumatized. CT CERVICAL SPINE FINDINGS No acute displaced fractures of the cervical spine. Alignment is anatomic. Prevertebral soft tissues  are normal. Mild multilevel degenerative disc disease, most severe at C5-C6. Visualized portions of the upper thorax are unremarkable. IMPRESSION: 1. No evidence of significant acute traumatic injury to the skull, brain or cervical spine. 2. The appearance of the brain is normal. 3. Mild multilevel degenerative disc disease, most severe at C5-C6. Electronically Signed   By: Vinnie Langton M.D.   On: 10/20/2015 09:46    Microbiology: Recent Results (from the past 240 hour(s))  Urine culture     Status: Abnormal   Collection Time: 10/20/15 10:35 AM  Result Value Ref Range Status   Specimen Description URINE, RANDOM  Final   Special Requests NONE  Final   Culture MULTIPLE SPECIES PRESENT, SUGGEST RECOLLECTION (A)  Final   Report Status 10/21/2015 FINAL  Final     Labs: Basic Metabolic Panel:  Recent Labs Lab 10/20/15 0850 10/21/15 0433  NA 140 143  K 3.4* 3.3*  CL 106 109  CO2 26 28  GLUCOSE 169* 106*  BUN 19 14  CREATININE 0.80 0.86  CALCIUM 8.5* 8.2*   CBC:  Recent Labs Lab 10/20/15 0850 10/21/15 0433  WBC 8.2 6.6  HGB 13.7 13.1  HCT 41.6 39.7  MCV 97.4 96.6  PLT 156 154   CBG:  Recent Labs Lab 10/20/15 1656 10/20/15 2200 10/21/15 0524 10/21/15 0755 10/21/15 1158  GLUCAP 144* 118* 95 95 96    Signed:  Barton Dubois MD.  [facility] 10/21/2015, 2:31 PM

## 2015-11-03 ENCOUNTER — Other Ambulatory Visit: Payer: Self-pay | Admitting: Family Medicine

## 2015-11-03 DIAGNOSIS — Z1231 Encounter for screening mammogram for malignant neoplasm of breast: Secondary | ICD-10-CM

## 2015-12-06 ENCOUNTER — Ambulatory Visit
Admission: RE | Admit: 2015-12-06 | Discharge: 2015-12-06 | Disposition: A | Discharge status: Home / Self Care | Payer: BLUE CROSS/BLUE SHIELD | Source: Ambulatory Visit | Attending: Family Medicine | Admitting: Family Medicine

## 2015-12-06 DIAGNOSIS — Z1231 Encounter for screening mammogram for malignant neoplasm of breast: Secondary | ICD-10-CM

## 2016-03-31 ENCOUNTER — Ambulatory Visit: Payer: Self-pay | Admitting: General Surgery

## 2016-06-27 ENCOUNTER — Ambulatory Visit: Payer: BLUE CROSS/BLUE SHIELD | Admitting: Dietician

## 2016-07-04 ENCOUNTER — Encounter (HOSPITAL_COMMUNITY): Payer: Self-pay

## 2016-07-04 NOTE — Progress Notes (Signed)
Please place orders in epic . Patient has pre-op appt 07-05-16 for upcoming surgery with [facility]. Thank you

## 2016-07-04 NOTE — Progress Notes (Signed)
EKG 10-20-15 epic CXR 10-20-15 epic

## 2016-07-04 NOTE — Patient Instructions (Addendum)
[name]  07/04/2016   Your procedure is scheduled on: 07-20-16  Report to [place]  elevators to 3rd floor to  [place] at 530AM.   Call this number if you have problems the morning of surgery [phone]    Remember: ONLY 1 PERSON MAY GO WITH YOU TO SHORT STAY TO GET  READY MORNING OF [place].  Do not eat food or drink liquids :After Midnight.     Take these medicines the morning of surgery with A SIP OF WATER: pantoprazole(protonix) as needed                               You may not have any metal on your body including hair pins and              piercings  Do not wear jewelry, make-up, lotions, powders or perfumes, deodorant             Do not wear nail polish.  Do not shave  48 hours prior to surgery.     Do not bring valuables to the hospital. [place].  Contacts, dentures or bridgework may not be worn into surgery.  Leave suitcase in the car. After surgery it may be brought to your room.              Please read over the following fact sheets you were given: _____________________________________________________________________    [facility] - Preparing for Surgery Before surgery, you can play an important role.  Because skin is not sterile, your skin needs to be as free of germs as possible.  You can reduce the number of germs on your skin by washing with CHG (chlorahexidine gluconate) soap before surgery.  CHG is an antiseptic cleaner which kills germs and bonds with the skin to continue killing germs even after washing. Please DO NOT use if you have an allergy to CHG or antibacterial soaps.  If your skin becomes reddened/irritated stop using the CHG and inform your nurse when you arrive at Short Stay. Do not shave (including legs and underarms) for at least 48 hours prior to the first CHG shower.  You may shave your face/neck. Please follow these  instructions carefully:  1.  Shower with CHG Soap the night before surgery and the  morning of Surgery.  2.  If you choose to wash your hair, wash your hair first as usual with your  normal  shampoo.  3.  After you shampoo, rinse your hair and body thoroughly to remove the  shampoo.                           4.  Use CHG as you would any other liquid soap.  You can apply chg directly  to the skin and wash                       Gently with a scrungie or clean washcloth.  5.  Apply the CHG Soap to your body ONLY FROM THE NECK DOWN.   Do not use on face/ open  Wound or open sores. Avoid contact with eyes, ears mouth and genitals (private parts).                       Wash face,  Genitals (private parts) with your normal soap.             6.  Wash thoroughly, paying special attention to the area where your surgery  will be performed.  7.  Thoroughly rinse your body with warm water from the neck down.  8.  DO NOT shower/wash with your normal soap after using and rinsing off  the CHG Soap.                9.  Pat yourself dry with a clean towel.            10.  Wear clean pajamas.            11.  Place clean sheets on your bed the night of your first shower and do not  sleep with pets. Day of Surgery : Do not apply any lotions/deodorants the morning of surgery.  Please wear clean clothes to the hospital/surgery center.  FAILURE TO FOLLOW THESE INSTRUCTIONS MAY RESULT IN THE CANCELLATION OF YOUR SURGERY PATIENT SIGNATURE_________________________________  NURSE SIGNATURE__________________________________  ________________________________________________________________________

## 2016-07-05 ENCOUNTER — Encounter (HOSPITAL_COMMUNITY)
Admission: RE | Admit: 2016-07-05 | Discharge: 2016-07-05 | Disposition: A | Discharge status: Home / Self Care | Payer: BLUE CROSS/BLUE SHIELD | Source: Ambulatory Visit | Attending: General Surgery | Admitting: General Surgery

## 2016-07-05 ENCOUNTER — Encounter (HOSPITAL_COMMUNITY): Payer: Self-pay

## 2016-07-05 DIAGNOSIS — Z01818 Encounter for other preprocedural examination: Secondary | ICD-10-CM | POA: Insufficient documentation

## 2016-07-05 HISTORY — DX: Personal history of other endocrine, nutritional and metabolic disease: Z86.39

## 2016-07-05 HISTORY — DX: Unspecified hearing loss, left ear: H91.92

## 2016-07-05 NOTE — Progress Notes (Signed)
Requested most recent labs from [place] records

## 2016-07-05 NOTE — Progress Notes (Signed)
Labs done on 06-29-16 on chart CBCdiff CMP KOR3G  TSH Folic Acid  Y56 Iron Vitamin d Lipid panel

## 2016-07-14 ENCOUNTER — Ambulatory Visit: Payer: Self-pay | Admitting: General Surgery

## 2016-07-18 NOTE — Anesthesia Preprocedure Evaluation (Signed)
Anesthesia Evaluation  Patient identified by MRN, date of birth, ID band Patient awake    Reviewed: Allergy & Precautions, NPO status , Patient's Chart, lab work & pertinent test results  History of Anesthesia Complications (+) PONV  Airway Mallampati: II  TM Distance: >3 FB Neck ROM: Full    Dental no notable dental hx.    Pulmonary former smoker,    Pulmonary exam normal breath sounds clear to auscultation       Cardiovascular hypertension, Normal cardiovascular exam Rhythm:Regular Rate:Normal     Neuro/Psych negative neurological ROS  negative psych ROS   GI/Hepatic negative GI ROS, Neg liver ROS,   Endo/Other  negative endocrine ROS  Renal/GU negative Renal ROS  negative genitourinary   Musculoskeletal negative musculoskeletal ROS (+)   Abdominal   Peds negative pediatric ROS (+)  Hematology negative hematology ROS (+)   Anesthesia Other Findings   Reproductive/Obstetrics negative OB ROS                             Anesthesia Physical Anesthesia Plan  ASA: II  Anesthesia Plan: General   Post-op Pain Management:    Induction: Intravenous  PONV Risk Score and Plan: 3 and Ondansetron, Dexamethasone, Propofol, Midazolam, Treatment may vary due to age and Scopolamine patch - Pre-op  Airway Management Planned: Oral ETT  Additional Equipment:   Intra-op Plan:   Post-operative Plan: Extubation in OR  Informed Consent: I have reviewed the patients History and Physical, chart, labs and discussed the procedure including the risks, benefits and alternatives for the proposed anesthesia with the patient or authorized representative who has indicated his/her understanding and acceptance.   Dental advisory given  Plan Discussed with: CRNA and Surgeon  Anesthesia Plan Comments:         Anesthesia Quick Evaluation

## 2016-07-20 ENCOUNTER — Encounter (HOSPITAL_COMMUNITY): Payer: Self-pay | Admitting: *Deleted

## 2016-07-20 ENCOUNTER — Inpatient Hospital Stay (HOSPITAL_COMMUNITY)
Admission: RE | Admit: 2016-07-20 | Discharge: 2016-07-22 | DRG: 355 | Disposition: A | Discharge status: Home / Self Care | Payer: BLUE CROSS/BLUE SHIELD | Source: Ambulatory Visit | Attending: General Surgery | Admitting: General Surgery

## 2016-07-20 ENCOUNTER — Inpatient Hospital Stay (HOSPITAL_COMMUNITY): Payer: BLUE CROSS/BLUE SHIELD | Admitting: Anesthesiology

## 2016-07-20 ENCOUNTER — Encounter (HOSPITAL_COMMUNITY)
Admission: RE | Disposition: A | Discharge status: Home / Self Care | Payer: Self-pay | Source: Ambulatory Visit | Attending: General Surgery

## 2016-07-20 DIAGNOSIS — H9192 Unspecified hearing loss, left ear: Secondary | ICD-10-CM | POA: Diagnosis present

## 2016-07-20 DIAGNOSIS — Z8249 Family history of ischemic heart disease and other diseases of the circulatory system: Secondary | ICD-10-CM

## 2016-07-20 DIAGNOSIS — K432 Incisional hernia without obstruction or gangrene: Secondary | ICD-10-CM | POA: Diagnosis present

## 2016-07-20 DIAGNOSIS — I10 Essential (primary) hypertension: Secondary | ICD-10-CM | POA: Diagnosis present

## 2016-07-20 DIAGNOSIS — K219 Gastro-esophageal reflux disease without esophagitis: Secondary | ICD-10-CM | POA: Diagnosis present

## 2016-07-20 DIAGNOSIS — G4733 Obstructive sleep apnea (adult) (pediatric): Secondary | ICD-10-CM | POA: Diagnosis present

## 2016-07-20 DIAGNOSIS — Z9884 Bariatric surgery status: Secondary | ICD-10-CM | POA: Diagnosis not present

## 2016-07-20 DIAGNOSIS — E65 Localized adiposity: Secondary | ICD-10-CM | POA: Diagnosis present

## 2016-07-20 DIAGNOSIS — Z90710 Acquired absence of both cervix and uterus: Secondary | ICD-10-CM

## 2016-07-20 DIAGNOSIS — F329 Major depressive disorder, single episode, unspecified: Secondary | ICD-10-CM | POA: Diagnosis present

## 2016-07-20 DIAGNOSIS — Z9049 Acquired absence of other specified parts of digestive tract: Secondary | ICD-10-CM

## 2016-07-20 DIAGNOSIS — Z79899 Other long term (current) drug therapy: Secondary | ICD-10-CM

## 2016-07-20 DIAGNOSIS — Z87891 Personal history of nicotine dependence: Secondary | ICD-10-CM

## 2016-07-20 HISTORY — PX: PANNICULECTOMY: SHX5360

## 2016-07-20 HISTORY — PX: APPLICATION OF WOUND VAC: SHX5189

## 2016-07-20 HISTORY — PX: VENTRAL HERNIA REPAIR: SHX424

## 2016-07-20 HISTORY — PX: INSERTION OF MESH: SHX5868

## 2016-07-20 SURGERY — REPAIR, HERNIA, VENTRAL
Anesthesia: General | Site: Abdomen

## 2016-07-20 MED ORDER — DEXAMETHASONE SODIUM PHOSPHATE 10 MG/ML IJ SOLN
INTRAMUSCULAR | Status: DC | PRN
  Administered 2016-07-20: 10 mg via INTRAVENOUS

## 2016-07-20 MED ORDER — CHLORHEXIDINE GLUCONATE CLOTH 2 % EX PADS: 6.0000 | MEDICATED_PAD | Freq: Once | CUTANEOUS | Status: DC

## 2016-07-20 MED ORDER — SODIUM CHLORIDE 0.9 % IJ SOLN
INTRAMUSCULAR | Status: AC
  Filled 2016-07-20: qty 10

## 2016-07-20 MED ORDER — SCOPOLAMINE 1 MG/3DAYS TD PT72
MEDICATED_PATCH | TRANSDERMAL | Status: DC | PRN
  Administered 2016-07-20: 1 via TRANSDERMAL

## 2016-07-20 MED ORDER — MAGNESIUM OXIDE 400 (241.3 MG) MG PO TABS
400.0000 mg | ORAL_TABLET | Freq: Every day | ORAL | Status: DC
  Administered 2016-07-20 – 2016-07-22 (×2): 400 mg via ORAL
  Filled 2016-07-20 (×2): qty 1

## 2016-07-20 MED ORDER — FENTANYL CITRATE (PF) 250 MCG/5ML IJ SOLN
INTRAMUSCULAR | Status: DC | PRN
  Administered 2016-07-20 (×3): 50 ug via INTRAVENOUS

## 2016-07-20 MED ORDER — POTASSIUM CHLORIDE IN NACL 20-0.9 MEQ/L-% IV SOLN
INTRAVENOUS | Status: DC
  Administered 2016-07-20 – 2016-07-22 (×5): via INTRAVENOUS
  Filled 2016-07-20 (×9): qty 1000

## 2016-07-20 MED ORDER — HYDROMORPHONE HCL 1 MG/ML IJ SOLN
0.2500 mg | INTRAMUSCULAR | Status: DC | PRN
  Administered 2016-07-20 (×3): 0.5 mg via INTRAVENOUS

## 2016-07-20 MED ORDER — LIDOCAINE 2% (20 MG/ML) 5 ML SYRINGE
INTRAMUSCULAR | Status: DC | PRN
  Administered 2016-07-20: 80 mg via INTRAVENOUS

## 2016-07-20 MED ORDER — HYDROMORPHONE HCL 1 MG/ML IJ SOLN
INTRAMUSCULAR | Status: AC
  Administered 2016-07-20: 0.5 mg via INTRAVENOUS
  Filled 2016-07-20: qty 2

## 2016-07-20 MED ORDER — GLYCOPYRROLATE 0.2 MG/ML IJ SOLN
INTRAMUSCULAR | Status: DC | PRN
  Administered 2016-07-20: 0.4 mg via INTRAVENOUS

## 2016-07-20 MED ORDER — DEXAMETHASONE SODIUM PHOSPHATE 10 MG/ML IJ SOLN
INTRAMUSCULAR | Status: AC
  Filled 2016-07-20: qty 1

## 2016-07-20 MED ORDER — SODIUM CHLORIDE 0.9 % IJ SOLN
INTRAMUSCULAR | Status: AC
  Filled 2016-07-20: qty 50

## 2016-07-20 MED ORDER — BUPIVACAINE LIPOSOME 1.3 % IJ SUSP
20.0000 mL | Freq: Once | INTRAMUSCULAR | Status: DC
  Filled 2016-07-20: qty 20

## 2016-07-20 MED ORDER — PROMETHAZINE HCL 25 MG/ML IJ SOLN: 6.2500 mg | INTRAMUSCULAR | Status: DC | PRN

## 2016-07-20 MED ORDER — PROPOFOL 10 MG/ML IV BOLUS
INTRAVENOUS | Status: DC | PRN
  Administered 2016-07-20: 150 mg via INTRAVENOUS

## 2016-07-20 MED ORDER — ONDANSETRON HCL 4 MG/2ML IJ SOLN
INTRAMUSCULAR | Status: DC | PRN
  Administered 2016-07-20: 4 mg via INTRAVENOUS

## 2016-07-20 MED ORDER — GABAPENTIN 300 MG PO CAPS
300.0000 mg | ORAL_CAPSULE | ORAL | Status: AC
  Administered 2016-07-20: 300 mg via ORAL
  Filled 2016-07-20: qty 1

## 2016-07-20 MED ORDER — FENTANYL CITRATE (PF) 250 MCG/5ML IJ SOLN
INTRAMUSCULAR | Status: AC
  Filled 2016-07-20: qty 5

## 2016-07-20 MED ORDER — ROCURONIUM BROMIDE 50 MG/5ML IV SOSY
PREFILLED_SYRINGE | INTRAVENOUS | Status: AC
  Filled 2016-07-20: qty 5

## 2016-07-20 MED ORDER — SUGAMMADEX SODIUM 200 MG/2ML IV SOLN
INTRAVENOUS | Status: DC | PRN
  Administered 2016-07-20: 200 mg via INTRAVENOUS

## 2016-07-20 MED ORDER — MIDAZOLAM HCL 2 MG/2ML IJ SOLN
INTRAMUSCULAR | Status: AC
  Filled 2016-07-20: qty 2

## 2016-07-20 MED ORDER — OXYCODONE HCL 5 MG PO TABS
5.0000 mg | ORAL_TABLET | ORAL | Status: DC | PRN
  Administered 2016-07-20 – 2016-07-21 (×3): 10 mg via ORAL
  Filled 2016-07-20 (×3): qty 2

## 2016-07-20 MED ORDER — SUGAMMADEX SODIUM 200 MG/2ML IV SOLN
INTRAVENOUS | Status: AC
  Filled 2016-07-20: qty 2

## 2016-07-20 MED ORDER — ONDANSETRON HCL 4 MG/2ML IJ SOLN
4.0000 mg | Freq: Four times a day (QID) | INTRAMUSCULAR | Status: DC | PRN
  Administered 2016-07-21 (×2): 4 mg via INTRAVENOUS
  Filled 2016-07-20 (×2): qty 2

## 2016-07-20 MED ORDER — ADULT MULTIVITAMIN W/MINERALS CH
1.0000 | ORAL_TABLET | Freq: Every day | ORAL | Status: DC
  Administered 2016-07-20 – 2016-07-22 (×2): 1 via ORAL
  Filled 2016-07-20 (×2): qty 1

## 2016-07-20 MED ORDER — LIDOCAINE 2% (20 MG/ML) 5 ML SYRINGE
INTRAMUSCULAR | Status: AC
  Filled 2016-07-20: qty 5

## 2016-07-20 MED ORDER — 0.9 % SODIUM CHLORIDE (POUR BTL) OPTIME
TOPICAL | Status: DC | PRN
  Administered 2016-07-20: 1000 mL

## 2016-07-20 MED ORDER — CYANOCOBALAMIN 500 MCG PO TABS
500.0000 ug | ORAL_TABLET | ORAL | Status: DC
  Filled 2016-07-20: qty 1

## 2016-07-20 MED ORDER — ROCURONIUM BROMIDE 50 MG/5ML IV SOSY
PREFILLED_SYRINGE | INTRAVENOUS | Status: DC | PRN
  Administered 2016-07-20: 20 mg via INTRAVENOUS
  Administered 2016-07-20 (×3): 10 mg via INTRAVENOUS
  Administered 2016-07-20: 50 mg via INTRAVENOUS

## 2016-07-20 MED ORDER — CEFAZOLIN SODIUM-DEXTROSE 2-4 GM/100ML-% IV SOLN
INTRAVENOUS | Status: AC
  Filled 2016-07-20: qty 100

## 2016-07-20 MED ORDER — PROPOFOL 10 MG/ML IV BOLUS
INTRAVENOUS | Status: AC
Start: 2016-07-20 — End: 2016-07-20
  Filled 2016-07-20: qty 20

## 2016-07-20 MED ORDER — SUCCINYLCHOLINE CHLORIDE 200 MG/10ML IV SOSY
PREFILLED_SYRINGE | INTRAVENOUS | Status: DC | PRN
  Administered 2016-07-20: 100 mg via INTRAVENOUS

## 2016-07-20 MED ORDER — CALCIUM CARBONATE-VITAMIN D 500-200 MG-UNIT PO TABS
1.0000 | ORAL_TABLET | Freq: Two times a day (BID) | ORAL | Status: DC
  Administered 2016-07-20 – 2016-07-22 (×4): 1 via ORAL
  Filled 2016-07-20 (×4): qty 1

## 2016-07-20 MED ORDER — SUCCINYLCHOLINE CHLORIDE 200 MG/10ML IV SOSY
PREFILLED_SYRINGE | INTRAVENOUS | Status: AC
  Filled 2016-07-20: qty 10

## 2016-07-20 MED ORDER — HYDROMORPHONE HCL 1 MG/ML IJ SOLN
1.0000 mg | INTRAMUSCULAR | Status: DC | PRN
  Administered 2016-07-20: 1 mg via INTRAVENOUS
  Filled 2016-07-20: qty 1

## 2016-07-20 MED ORDER — KETOROLAC TROMETHAMINE 30 MG/ML IJ SOLN: 30.0000 mg | Freq: Once | INTRAMUSCULAR | Status: DC | PRN

## 2016-07-20 MED ORDER — MIRABEGRON ER 25 MG PO TB24
50.0000 mg | ORAL_TABLET | Freq: Every day | ORAL | Status: DC
  Administered 2016-07-22: 50 mg via ORAL
  Filled 2016-07-20 (×3): qty 2

## 2016-07-20 MED ORDER — SCOPOLAMINE 1 MG/3DAYS TD PT72
MEDICATED_PATCH | TRANSDERMAL | Status: AC
  Filled 2016-07-20: qty 1

## 2016-07-20 MED ORDER — ONDANSETRON HCL 4 MG/2ML IJ SOLN
INTRAMUSCULAR | Status: AC
  Filled 2016-07-20: qty 2

## 2016-07-20 MED ORDER — ONDANSETRON 4 MG PO TBDP: 4.0000 mg | ORAL_TABLET | Freq: Four times a day (QID) | ORAL | Status: DC | PRN

## 2016-07-20 MED ORDER — CELECOXIB 200 MG PO CAPS
400.0000 mg | ORAL_CAPSULE | ORAL | Status: AC
  Administered 2016-07-20: 400 mg via ORAL
  Filled 2016-07-20: qty 2

## 2016-07-20 MED ORDER — PANTOPRAZOLE SODIUM 40 MG PO TBEC
40.0000 mg | DELAYED_RELEASE_TABLET | Freq: Every day | ORAL | Status: DC | PRN
  Administered 2016-07-21: 40 mg via ORAL
  Filled 2016-07-20: qty 1

## 2016-07-20 MED ORDER — ENOXAPARIN SODIUM 40 MG/0.4ML ~~LOC~~ SOLN
40.0000 mg | SUBCUTANEOUS | Status: DC
  Administered 2016-07-21: 40 mg via SUBCUTANEOUS
  Filled 2016-07-20: qty 0.4

## 2016-07-20 MED ORDER — CEFAZOLIN SODIUM-DEXTROSE 2-4 GM/100ML-% IV SOLN
2.0000 g | INTRAVENOUS | Status: AC
  Administered 2016-07-20: 2 g via INTRAVENOUS

## 2016-07-20 MED ORDER — LACTATED RINGERS IV SOLN
INTRAVENOUS | Status: DC | PRN
  Administered 2016-07-20 (×2): via INTRAVENOUS

## 2016-07-20 MED ORDER — GLYCOPYRROLATE 0.2 MG/ML IV SOSY
PREFILLED_SYRINGE | INTRAVENOUS | Status: AC
  Filled 2016-07-20: qty 5

## 2016-07-20 MED ORDER — MIDAZOLAM HCL 2 MG/2ML IJ SOLN
INTRAMUSCULAR | Status: DC | PRN
  Administered 2016-07-20: 2 mg via INTRAVENOUS

## 2016-07-20 MED ORDER — ACETAMINOPHEN 500 MG PO TABS
1000.0000 mg | ORAL_TABLET | ORAL | Status: AC
  Administered 2016-07-20: 1000 mg via ORAL
  Filled 2016-07-20: qty 2

## 2016-07-20 SURGICAL SUPPLY — 66 items
APPLICATOR COTTON TIP 6IN STRL (MISCELLANEOUS) ×2 IMPLANT
BINDER ABDOMINAL 12 ML 46-62 (SOFTGOODS) IMPLANT
BIOPATCH WHT 1IN DISK W/4.0 H (GAUZE/BANDAGES/DRESSINGS) IMPLANT
BLADE EXTENDED COATED 6.5IN (ELECTRODE) IMPLANT
BLADE HEX COATED 2.75 (ELECTRODE) ×4 IMPLANT
BLADE SURG 15 STRL LF DISP TIS (BLADE) ×2 IMPLANT
BLADE SURG 15 STRL SS (BLADE) ×3
BLADE SURG SZ10 CARB STEEL (BLADE) ×9 IMPLANT
COVER MAYO STAND STRL (DRAPES) IMPLANT
COVER SURGICAL LIGHT HANDLE (MISCELLANEOUS) ×3 IMPLANT
DECANTER SPIKE VIAL GLASS SM (MISCELLANEOUS) IMPLANT
DEVICE TROCAR PUNCTURE CLOSURE (ENDOMECHANICALS) IMPLANT
DRAIN CHANNEL 19F RND (DRAIN) ×1 IMPLANT
DRAPE INCISE IOBAN 66X45 STRL (DRAPES) ×2 IMPLANT
DRAPE LAPAROSCOPIC ABDOMINAL (DRAPES) ×3 IMPLANT
DRAPE WARM FLUID 44X44 (DRAPE) ×2 IMPLANT
DRSG OPSITE POSTOP 4X10 (GAUZE/BANDAGES/DRESSINGS) IMPLANT
DRSG OPSITE POSTOP 4X8 (GAUZE/BANDAGES/DRESSINGS) IMPLANT
ELECT PENCIL ROCKER SW 15FT (MISCELLANEOUS) ×4 IMPLANT
ELECT REM PT RETURN 15FT ADLT (MISCELLANEOUS) ×3 IMPLANT
EVACUATOR SILICONE 100CC (DRAIN) ×1 IMPLANT
GAUZE SPONGE 4X4 12PLY STRL (GAUZE/BANDAGES/DRESSINGS) ×2 IMPLANT
GLOVE BIOGEL M 8.0 STRL (GLOVE) ×3 IMPLANT
GLOVE BIOGEL PI IND STRL 7.0 (GLOVE) ×2 IMPLANT
GLOVE BIOGEL PI IND STRL 7.5 (GLOVE) ×2 IMPLANT
GLOVE BIOGEL PI INDICATOR 7.0 (GLOVE) ×1
GLOVE BIOGEL PI INDICATOR 7.5 (GLOVE) ×1
GLOVE ECLIPSE 7.5 STRL STRAW (GLOVE) ×3 IMPLANT
GOWN STRL REUS W/TWL LRG LVL3 (GOWN DISPOSABLE) ×3 IMPLANT
GOWN STRL REUS W/TWL XL LVL3 (GOWN DISPOSABLE) ×6 IMPLANT
HANDLE SUCTION POOLE (INSTRUMENTS) IMPLANT
KIT BASIN OR (CUSTOM PROCEDURE TRAY) ×3 IMPLANT
MESH HERNIA 10X14 SHEET (Mesh General) ×1 IMPLANT
NEEDLE HYPO 22GX1.5 SAFETY (NEEDLE) IMPLANT
NS IRRIG 1000ML POUR BTL (IV SOLUTION) ×5 IMPLANT
PACK GENERAL/GYN (CUSTOM PROCEDURE TRAY) ×3 IMPLANT
PACK UNIVERSAL I (CUSTOM PROCEDURE TRAY) ×2 IMPLANT
PREVENA INCISION MGT 90 150 (MISCELLANEOUS) ×1 IMPLANT
SPONGE LAP 18X18 X RAY DECT (DISPOSABLE) ×10 IMPLANT
STAPLER VISISTAT 35W (STAPLE) ×6 IMPLANT
SUCTION POOLE HANDLE (INSTRUMENTS)
SUT ETHIBOND 2 0 SH (SUTURE)
SUT ETHIBOND 2 0 SH 36X2 (SUTURE) IMPLANT
SUT ETHILON 3 0 PS 1 (SUTURE) ×1 IMPLANT
SUT MNCRL AB 4-0 PS2 18 (SUTURE) ×1 IMPLANT
SUT NOVA NAB GS-21 0 18 T12 DT (SUTURE) IMPLANT
SUT PDS AB 1 CTX 36 (SUTURE) IMPLANT
SUT PDS AB 2-0 CT2 27 (SUTURE) IMPLANT
SUT PROLENE 0 CT 1 30 (SUTURE) ×8 IMPLANT
SUT PROLENE 0 CT 1 CR/8 (SUTURE) IMPLANT
SUT PROLENE 2 0 KS (SUTURE) ×6 IMPLANT
SUT SILK 2 0 (SUTURE)
SUT SILK 2 0 SH CR/8 (SUTURE) IMPLANT
SUT SILK 2-0 18XBRD TIE 12 (SUTURE) ×2 IMPLANT
SUT SILK 3 0 (SUTURE)
SUT SILK 3-0 18XBRD TIE 12 (SUTURE) IMPLANT
SUT VIC AB 3-0 SH 27 (SUTURE)
SUT VIC AB 3-0 SH 27XBRD (SUTURE) IMPLANT
SUT VIC AB 3-0 SH 8-18 (SUTURE) ×4 IMPLANT
SUT VIC AB 4-0 SH 18 (SUTURE) IMPLANT
SYR 20CC LL (SYRINGE) IMPLANT
SYR BULB IRRIGATION 50ML (SYRINGE) ×2 IMPLANT
TOWEL OR 17X26 10 PK STRL BLUE (TOWEL DISPOSABLE) ×3 IMPLANT
TRAY FOLEY W/METER SILVER 16FR (SET/KITS/TRAYS/PACK) ×2 IMPLANT
YANKAUER SUCT BULB TIP 10FT TU (MISCELLANEOUS) ×3 IMPLANT
YANKAUER SUCT BULB TIP NO VENT (SUCTIONS) IMPLANT

## 2016-07-20 NOTE — Anesthesia Procedure Notes (Signed)
Procedure Name: Intubation Date/Time: 07/20/2016 7:48 AM Performed by: Dione Booze Pre-anesthesia Checklist: Patient identified, Emergency Drugs available, Suction available and Patient being monitored Patient Re-evaluated:Patient Re-evaluated prior to inductionOxygen Delivery Method: Circle system utilized Preoxygenation: Pre-oxygenation with 100% oxygen Intubation Type: IV induction Laryngoscope Size: Mac Grade View: Grade II Tube type: Oral Tube size: 7.5 mm Number of attempts: 1 Airway Equipment and Method: Stylet Placement Confirmation: positive ETCO2,  ETT inserted through vocal cords under direct vision and breath sounds checked- equal and bilateral Secured at: 22 cm Tube secured with: Tape Dental Injury: Teeth and Oropharynx as per pre-operative assessment  Future Recommendations: Recommend- induction with short-acting agent, and alternative techniques readily available

## 2016-07-20 NOTE — Anesthesia Postprocedure Evaluation (Signed)
Anesthesia Post Note  Patient: [name]  Procedure(s) Performed: Procedure(s) (LRB): REPAIR OF VENTAL HERNIA WITH MESH (N/A) INSERTION OF MESH (N/A) PANNICULECTOMY (N/A) APPLICATION OF WOUND VAC (N/A)     Patient location during evaluation: PACU Anesthesia Type: General Level of consciousness: awake and alert Pain management: pain level controlled Vital Signs Assessment: post-procedure vital signs reviewed and stable Respiratory status: spontaneous breathing, nonlabored ventilation, respiratory function stable and patient connected to nasal cannula oxygen Cardiovascular status: blood pressure returned to baseline and stable Postop Assessment: no signs of nausea or vomiting Anesthetic complications: no    Last Vitals:  Vitals:   07/20/16 1100 07/20/16 1113  BP: 120/73   Pulse: 62 (!) 51  Resp: 13 13  Temp:      Last Pain:  Vitals:   07/20/16 1100  TempSrc:   PainSc: Asleep                 Bucky Grigg S

## 2016-07-20 NOTE — H&P (Signed)
[name] is an 51 y.o. female.   Chief Complaint: Hernia   HPI: Patient has a history of gastric bypass surgery laparoscopically in 2014. She has had over 100 pound weight loss. She has had a known ventral incisional hernia from previous hysterectomy just below the umbilicus. This has become increasingly symptomatic and is associated with a large abdominal pannus. After previous workup and discussion detailed elsewhere she is electively admitted for ventral hernia repair in association with panniculectomy.  Past Medical History:  Diagnosis Date  . Acid reflux 07/22/2012   much improved with Bariatric diet  . Anemia    Hgb 5 02/18/10, blood transfusion 02/18/10  . Cervical radiculopathy at C6 01/25/2011  . Cystocele   . Deafness in left ear   . Depression   . Dysfunctional uterine bleeding    resolved after  Hysterectomy(hx. fibroids)  . History of reactive hypoglycemia 2017  . Hypertension   . Lateral epicondylitis  of elbow 02/01/2011  . Morbid obesity ([place])   . Obesity, Class III, BMI 40-49.9 (morbid obesity) ([place])   . OSA on CPAP    cpap used nightly, settings 13  . PCOS (polycystic ovarian syndrome)   . PONV (postoperative nausea and vomiting)    no PONV with last procedure when using "patch behind my ear", does endorse extreme pain following hysterectomy , states" i think they managed by pain poorly bc they were worried about my sleep apnea"  . Rectocele    stress incontinence  . Syncope 10/2015    Past Surgical History:  Procedure Laterality Date  . ABDOMINAL HYSTERECTOMY    . CHOLECYSTECTOMY  1993   laparoscopic  . COLONOSCOPY     2017   . GASTRIC ROUX-EN-Y N/A 12/23/2012   Procedure: LAPAROSCOPIC ROUX-EN-Y GASTRIC BYPASS WITH UPPER ENDOSCOPY;  Surgeon: Madilyn Hook, DO;  Location: [facility];  Service: General;  Laterality: N/A;  . UPPER GI ENDOSCOPY N/A 12/23/2012   Procedure: UPPER GI ENDOSCOPY;  Surgeon: Madilyn Hook, DO;  Location: [facility];  Service: General;   Laterality: N/A;  . VAGINAL DELIVERY  1993    Family History  Problem Relation Age of Onset  . Hyperlipidemia Mother   . Hypertension Mother   . Hyperlipidemia Father   . Hypertension Father   . Heart attack Father 47       5-bypass surgery  . Heart disease Father   . Cancer Paternal Uncle        colon  . Colon cancer Maternal Uncle   . Diabetes Maternal Aunt    Social History:  reports that she quit smoking about 21 years ago. Her smoking use included Cigarettes. She has never used smokeless tobacco. She reports that she does not drink alcohol or use drugs.  Allergies: No Known Allergies  Medications Prior to Admission  Medication Sig Dispense Refill  . Calcium Citrate-Vitamin D (CALCIUM CITRATE +D PO) Take 1 tablet by mouth 2 (two) times daily.    . Cyanocobalamin (B-12) 500 MCG SUBL Place 500 mcg under the tongue 3 (three) times a week.    . magnesium oxide (MAG-OX) 400 MG tablet Take 400 mg by mouth daily.    . mirabegron ER (MYRBETRIQ) 50 MG TB24 tablet Take 50 mg by mouth daily.    . Multiple Vitamins-Minerals (CELEBRATE MULTI-COMPLETE 36) CAPS Take 1 tablet by mouth daily.    . pantoprazole (PROTONIX) 40 MG tablet Take 1 tablet (40 mg total) by mouth daily as needed (heartburn). 50 tablet 2  No results found for this or any previous visit (from the past 48 hour(s)). No results found.  Review of Systems  Constitutional: Negative for chills and fever.  Respiratory: Negative.   Cardiovascular: Negative.   Gastrointestinal: Negative.     Blood pressure 119/71, pulse 60, temperature 97.9 F (36.6 C), temperature source Oral, resp. rate 16, height 5\' 8"  (1.727 m), weight 87.5 kg (193 lb), last menstrual period 03/21/2010, SpO2 100 %. Physical Exam  General: Alert, well-developed Caucasian female, in no distress Skin: Warm and dry without rash or infection. HEENT: No palpable masses or thyromegaly. Sclera nonicteric. Pupils equal round and reactive. Oropharynx  clear Lungs: Breath sounds clear and equal without increased work of breathing Cardiovascular: Regular rate and rhythm without murmur.  Abdomen: Nondistended. Soft and nontender. Moderate sized reducible ventral incisional hernia just below the umbilicus. Large abdominal pannus. Extremities: No edema or joint swelling or deformity. No chronic venous stasis changes. Neurologic: Alert and fully oriented. Gait normal  Assessment/Plan Ventral incisional hernia lower abdomen in association with a large abdominal pannus following massive weight loss status post Roux-en-Y gastric bypass. Plan to proceed with repair of her incisional hernia with retrorectus technique using mesh in association with panniculectomy. The procedure and indications and risks have been discussed in detail with the patient documented elsewhere and she agrees to proceed.  Edward Jolly, MD 07/20/2016, 7:27 AM

## 2016-07-20 NOTE — Op Note (Signed)
Preoperative Diagnosis: CENTRAL INCISIONAL HERNIA  Postoprative Diagnosis: CENTRAL INCISIONAL HERNIA  Procedure: Procedure(s): REPAIR OF VENTAL HERNIA WITH MESH INSERTION OF MESH PANNICULECTOMY APPLICATION OF WOUND VAC   Surgeon: Excell Seltzer T   Assistants: Coralie Keens  Anesthesia:  General endotracheal anesthesia  Indications: Patient is status post Roux-en-Y gastric bypass with successful weight loss of over 150 pounds. She has a known previous lower abdominal ventral incisional hernia which we plan to fix after weight loss. She also has a large abdominal pannus. After preoperative discussion regarding surgery and options and risks detailed elsewhere we have elected to proceed with repair of her ventral incisional hernia with mesh using a retrorectus technique in conjunction with panniculectomy.    Procedure Detail:  Patient was brought to the operating room, placed in the supine position on the operating table, and general endotracheal anesthesia induced. Foley catheter was placed. The abdomen was widely sterilely prepped and draped. She received preoperative IV antibiotics. PAS were in place. Patient timeout was performed and correct procedure verified. I had previously marked the planned panniculectomy with the patient standing in the holding area. A large transverse curvilinear incision was used in order to excise her large pannus. Dissection was carried down through the subcutaneous tissue with cautery to the level of the fascia. The pannus was reflected up off the fascia with cautery. The hernia sac was identified just below the umbilicus. Pannus was removed. In order to fully expose the hernia a superior flap was created along the level of the fascia for about 10 cm. The umbilicus was dissected up off the level of the fascia. The umbilical skin was entered and this was closed with subcuticular Monocryl. The flap was carried up above the level of hernia for at least 5 or 10  cm. The hernia sac was then opened in the midline. There were no intra-abdominal or anterior abdominal Henne adhesions. The hernia sac was completely excised leaving a defect of probably 6 cm. On each side the peritoneum was incised and posterior rectus sheath incised and careful blunt dissection carried laterally behind the rectus muscle and taken out to the lateral aspect of the rectus sheath protecting the perforating vessels. This was done bilaterally and then the dissection was carried along the midline superiorly and inferiorly for 6 or 7 cm in both directions so we would have very broad overlap of the hernia. At this point the peritoneum and posterior sheath was closed with running 0 Vicryl begun at either end and tied centrally. This left a 20 x 10 cm space for placement of the retrorectus mesh. Prolene mesh was trimmed to size and then sutured with trans-fascial 0 Prolene sutures starting on either end superiorly and inferiorly and then using 3 additional sutures on each side with nice taut deployment of the mesh in all directions widely out to the edge of the rectus sheath. This space was irrigated and hemostasis assured. A 19 Blake closed suction drain was left in this space and brought out through a stab wound in the upper left abdomen. The anterior fascia was then closed using running 0 Prolene begun at either end and tied centrally. The skin and subcutaneous tissue was thoroughly irrigated and hemostasis assured. The wound was closed with subcutaneous tissue closed with layered 3-0 Vicryl and skin closed with staples. An of [place]ena wound VAC system was applied to the incision with a good seal. Sponge needle and Smith counts were correct. Patient was taken to recovery in good condition.  Findings: As above  Estimated Blood Loss:  less than 100 mL         Drains: 19 round Blake in retrorectus space  Blood Given: none          Specimens: Abdominal pannus        Complications:  * No  complications entered in OR log *         Disposition: PACU - hemodynamically stable.         Condition: stable

## 2016-07-20 NOTE — Transfer of Care (Signed)
Immediate Anesthesia Transfer of Care Note  Patient: [name]  Procedure(s) Performed: Procedure(s): REPAIR OF VENTAL HERNIA WITH MESH (N/A) INSERTION OF MESH (N/A) PANNICULECTOMY (N/A) APPLICATION OF WOUND VAC (N/A)  Patient Location: PACU  Anesthesia Type:General  Level of Consciousness: awake, alert  and patient cooperative  Airway & Oxygen Therapy: Patient Spontanous Breathing and Patient connected to face mask oxygen  Post-op Assessment: Report given to RN and Post -op Vital signs reviewed and stable  Post vital signs: Reviewed and stable  Last Vitals:  Vitals:   07/20/16 0538  BP: 119/71  Pulse: 60  Resp: 16  Temp: 36.6 C    Last Pain:  Vitals:   07/20/16 0538  TempSrc: Oral      Patients Stated Pain Goal: 4 (12/87/86 7672)  Complications: No apparent anesthesia complications

## 2016-07-20 NOTE — Progress Notes (Signed)
07/20/16 2300 Nursing Dr, Lucia Gaskins called reg patient's low bp at this time. No orders received. Will continue to monitor patient

## 2016-07-21 ENCOUNTER — Encounter (HOSPITAL_COMMUNITY): Payer: Self-pay | Admitting: General Surgery

## 2016-07-21 LAB — CBC
HEMATOCRIT: 34.5 % — AB (ref 36.0–46.0)
HEMOGLOBIN: 11.6 g/dL — AB (ref 12.0–15.0)
MCH: 32.5 pg (ref 26.0–34.0)
MCHC: 33.6 g/dL (ref 30.0–36.0)
MCV: 96.6 fL (ref 78.0–100.0)
Platelets: 176 10*3/uL (ref 150–400)
RBC: 3.57 MIL/uL — ABNORMAL LOW (ref 3.87–5.11)
RDW: 11.9 % (ref 11.5–15.5)
WBC: 8.6 10*3/uL (ref 4.0–10.5)

## 2016-07-21 MED ORDER — SENNOSIDES-DOCUSATE SODIUM 8.6-50 MG PO TABS
1.0000 | ORAL_TABLET | Freq: Every day | ORAL | Status: DC
  Administered 2016-07-21: 1 via ORAL
  Filled 2016-07-21: qty 1

## 2016-07-21 MED ORDER — ACETAMINOPHEN 325 MG PO TABS
650.0000 mg | ORAL_TABLET | ORAL | Status: DC | PRN
  Administered 2016-07-21 – 2016-07-22 (×3): 650 mg via ORAL
  Filled 2016-07-21 (×3): qty 2

## 2016-07-21 NOTE — Progress Notes (Signed)
1 Day Post-Op   Subjective: A fair amount of pain when trying to move about but fine at rest. Has not yet been out of bed. No nausea.  Objective: Vital signs in last 24 hours: Temp:  [97.8 F (36.6 C)-99.6 F (37.6 C)] 99.6 F (37.6 C) (06/08 0954) Pulse Rate:  [45-71] 64 (06/08 0954) Resp:  [13-16] 16 (06/08 0954) BP: (88-131)/(41-84) 104/59 (06/08 0954) SpO2:  [96 %-100 %] 97 % (06/08 0954) Last BM Date: 07/18/16  Intake/Output from previous day: 06/07 0701 - 06/08 0700 In: 3520 [P.O.:720; I.V.:2800] Out: 2080 [Urine:1505; Drains:275; Blood:300] Intake/Output this shift: Total I/O In: 240 [P.O.:240] Out: 550 [Urine:550]  General appearance: alert, cooperative and no distress GI: Mild incisional tenderness. Nondistended. Incision/Wound: Incisional VAC in place and intact. JP drainage serosanguineous.  Lab Results:   Recent Labs  07/21/16 0518  WBC 8.6  HGB 11.6*  HCT 34.5*  PLT 176   BMET No results for input(s): NA, K, CL, CO2, GLUCOSE, BUN, CREATININE, CALCIUM in the last 72 hours.   Studies/Results: No results found.  Anti-infectives: Anti-infectives    Start     Dose/Rate Route Frequency Ordered Stop   07/20/16 0629  ceFAZolin (ANCEF) 2-4 GM/100ML-% IVPB    Comments:  Dione Booze   : cabinet override      07/20/16 0629 07/20/16 1844   07/20/16 0551  ceFAZolin (ANCEF) IVPB 2g/100 mL premix     2 g 200 mL/hr over 30 Minutes Intravenous On call to O.R. 07/20/16 0551 07/20/16 0812      Assessment/Plan: s/p Procedure(s): REPAIR OF VENTAL HERNIA WITH MESH INSERTION OF MESH PANNICULECTOMY APPLICATION OF WOUND VAC Stable postoperatively. Has not yet been out of bed and not ready for discharge today. Ambulate today. Possible discharge tomorrow.   LOS: 1 day    [name] 6/8/2018Patient ID: [name], female   DOB: 01-24-1966, 51 y.o.   MRN: [id number]

## 2016-07-22 MED ORDER — ONDANSETRON HCL 4 MG PO TABS: 4.0000 mg | ORAL_TABLET | ORAL | 0 refills | Status: AC | PRN

## 2016-07-22 MED ORDER — OXYCODONE HCL 5 MG PO TABS: 5.0000 mg | ORAL_TABLET | ORAL | 0 refills | Status: DC | PRN

## 2016-07-22 NOTE — Discharge Summary (Signed)
Physician Discharge Summary  Patient ID: [name] MRN: [id number] DOB/AGE: Jan 16, 1966 51 y.o.  Admit date: 07/20/2016 Discharge date: 07/22/2016  Admission Diagnoses:  Ventral Incisional Hernia  Discharge Diagnoses:  Active Problems:   Ventral incisional hernia s/p repair with mesh, panniculectomy, application of wound VAC 07/20/16   Discharged Condition: good  Hospital Course: She underwent the above operation.  She progressed well over 2 days and was discharged with VAC on and drain in.  She was given discharge instructions.   Discharge Exam: Blood pressure 116/70, pulse 62, temperature 99.6 F (37.6 C), temperature source Oral, resp. rate 16, height 5\' 8"  (1.727 m), weight 87.5 kg (193 lb), last menstrual period 03/21/2010, SpO2 100 %.   Disposition: 01-Home or Self Care   Allergies as of 07/22/2016   No Known Allergies     Medication List    TAKE these medications   B-12 500 MCG Subl Place 500 mcg under the tongue 3 (three) times a week.   CALCIUM CITRATE +D PO Take 1 tablet by mouth 2 (two) times daily.   CELEBRATE MULTI-COMPLETE 36 Caps Take 1 tablet by mouth daily.   magnesium oxide 400 MG tablet Commonly known as:  MAG-OX Take 400 mg by mouth daily.   mirabegron ER 50 MG Tb24 tablet Commonly known as:  MYRBETRIQ Take 50 mg by mouth daily.   oxyCODONE 5 MG immediate release tablet Commonly known as:  Oxy IR/ROXICODONE Take 1-2 tablets (5-10 mg total) by mouth every 4 (four) hours as needed for moderate pain.   pantoprazole 40 MG tablet Commonly known as:  PROTONIX Take 1 tablet (40 mg total) by mouth daily as needed (heartburn).      Follow-up [facility], [place]. Schedule an appointment as soon as possible for a visit in 1 week(s).   Specialty:  General Surgery Why:  Call for nurse visit for removal of incisional VAC dressing Contact information: [address] [place]  [place], [place], [place]. Schedule an appointment as soon as possible for a visit in 2 week(s).   Specialty:  General Surgery Why:  For staple removal Contact information: [place] [address] [phone]           Signed: Odis Hollingshead 07/22/2016, 7:48 AM

## 2016-07-22 NOTE — Discharge Instructions (Addendum)
[facility], [place] [phone]  OPEN ABDOMINAL SURGERY: POST OP INSTRUCTIONS  Always review your discharge instruction sheet given to you by the facility where your surgery was performed.  IF YOU HAVE DISABILITY OR FAMILY LEAVE FORMS, YOU MUST BRING THEM TO THE OFFICE FOR PROCESSING.  PLEASE DO NOT GIVE THEM TO YOUR DOCTOR.  1. A prescription for pain medication may be given to you upon discharge.  Take your pain medication as prescribed, if needed.  If narcotic pain medicine is not needed, then you may take acetaminophen (Tylenol) or ibuprofen (Advil) as needed. 2. Take your usually prescribed medications unless otherwise directed. 3. If you need a refill on your pain medication, please contact your pharmacy. They will contact our office to request authorization.  Prescriptions will not be filled after 5pm or on week-ends. 4. You should follow a light diet the first few days after arrival home, such as soup and crackers, pudding, etc.unless your doctor has advised otherwise. A high-fiber, low fat diet can be resumed as tolerated.   Be sure to include lots of fluids daily. Most patients will experience some swelling and bruising on the chest and neck area.  Ice packs will help.  Swelling and bruising can take several days to resolve 5. Most patients will experience some swelling and bruising in the area of the incision. Ice pack will help. Swelling and bruising can take several days to resolve..  6. It is common to experience some constipation if taking pain medication after surgery.  Increasing fluid intake and taking a stool softener will usually help or prevent this problem from occurring.  A mild laxative (Milk of Magnesia or Miralax) should be taken according to package directions if there are no bowel movements after 48 hours. 7.  You may have steri-strips (small skin tapes) in place directly over the incision.  These strips should be left on the skin for 7-10 days.  If your  surgeon used skin glue on the incision, you may shower in 24 hours.  The glue will flake off over the next 2-3 weeks.  Any sutures or staples will be removed at the office during your follow-up visit. You may find that a light gauze bandage over your incision may keep your staples from being rubbed or pulled. You may shower and replace the bandage daily. 8. ACTIVITIES:  You may resume regular (light) daily activities beginning the next day--such as daily self-care, walking, climbing stairs--gradually increasing activities as tolerated.  You may have sexual intercourse when it is comfortable.  Refrain from any heavy lifting or straining until approved by your doctor. a. You may drive when you no longer are taking prescription pain medication, you can comfortably wear a seatbelt, and you can safely maneuver your car and apply brakes b. Return to Work: ___________________________________ 26. You should see your doctor in the office for a follow-up appointment approximately two weeks after your surgery.  Make sure that you call for this appointment within a day or two after you arrive home to insure a convenient appointment time. OTHER INSTRUCTIONS:  _Empty drain and record output twice a day.  ____________________________________________________________ _____________________________________________________________  WHEN TO CALL YOUR DOCTOR: 1. Fever over 101.0 2. Inability to urinate 3. Nausea and/or vomiting 4. Extreme swelling or bruising 5. Continued bleeding from incision. 6. Increased pain, redness, or drainage from the incision. 7. Difficulty swallowing or breathing 8. Muscle cramping or spasms. 9. Numbness or tingling in hands or feet or  around lips.  The clinic staff is available to answer your questions during regular business hours.  Please dont hesitate to call and ask to speak to one of the nurses if you have concerns.  For further questions, please visit  [email or web address]

## 2016-07-22 NOTE — Progress Notes (Signed)
Assessment REPAIR OF VENTAL HERNIA WITH MESH INSERTION OF MESH PANNICULECTOMY APPLICATION OF WOUND VAC -doing much better today   Plan:  Discharge today.   LOS: 2 days     2 Days Post-Op  Chief Complaint/Subjective: Feeling better.  Voiding well.  Getting OOB independently.  Tolerating diet.  Objective: Vital signs in last 24 hours: Temp:  [99.2 F (37.3 C)-99.6 F (37.6 C)] 99.6 F (37.6 C) (06/09 0626) Pulse Rate:  [59-64] 62 (06/09 0626) Resp:  [16] 16 (06/09 0626) BP: (104-120)/(52-70) 116/70 (06/09 0626) SpO2:  [97 %-100 %] 100 % (06/09 0626) Last BM Date: 07/19/16 (Per pt report)  Intake/Output from previous day: 06/08 0701 - 06/09 0700 In: 3170 [P.O.:870; I.V.:2300] Out: 3305 [Urine:3225; Drains:80] Intake/Output this shift: No intake/output data recorded.  PE: General- In NAD.  Awake and alert. Abdomen-soft, serosanguinous drain output, wound VAC on  Lab Results:   Recent Labs  07/21/16 0518  WBC 8.6  HGB 11.6*  HCT 34.5*  PLT 176   BMET No results for input(s): NA, K, CL, CO2, GLUCOSE, BUN, CREATININE, CALCIUM in the last 72 hours. PT/INR No results for input(s): LABPROT, INR in the last 72 hours. Comprehensive Metabolic Panel:    Component Value Date/Time   NA 143 10/21/2015 0433   NA 140 10/20/2015 0850   K 3.3 (L) 10/21/2015 0433   K 3.4 (L) 10/20/2015 0850   CL 109 10/21/2015 0433   CL 106 10/20/2015 0850   CO2 28 10/21/2015 0433   CO2 26 10/20/2015 0850   BUN 14 10/21/2015 0433   BUN 19 10/20/2015 0850   CREATININE 0.86 10/21/2015 0433   CREATININE 0.80 10/20/2015 0850   CREATININE 0.68 02/19/2014 1505   CREATININE 0.69 10/01/2013 1606   GLUCOSE 106 (H) 10/21/2015 0433   GLUCOSE 169 (H) 10/20/2015 0850   CALCIUM 8.2 (L) 10/21/2015 0433   CALCIUM 8.5 (L) 10/20/2015 0850   AST 21 02/19/2014 1505   AST 17 10/01/2013 1606   ALT 19 02/19/2014 1505   ALT 15 10/01/2013 1606   ALKPHOS 49 02/19/2014 1505   ALKPHOS 50 10/01/2013 1606    BILITOT 0.5 02/19/2014 1505   BILITOT 0.4 10/01/2013 1606   PROT 6.4 02/19/2014 1505   PROT 6.6 10/01/2013 1606   ALBUMIN 3.9 02/19/2014 1505   ALBUMIN 4.1 10/01/2013 1606     Studies/Results: No results found.  Anti-infectives: Anti-infectives    Start     Dose/Rate Route Frequency Ordered Stop   07/20/16 0629  ceFAZolin (ANCEF) 2-4 GM/100ML-% IVPB    Comments:  Tamera Punt, Loraine   : cabinet override      07/20/16 0629 07/20/16 1844   07/20/16 0551  ceFAZolin (ANCEF) IVPB 2g/100 mL premix     2 g 200 mL/hr over 30 Minutes Intravenous On call to O.R. 07/20/16 4315 07/20/16 4008       Dorance Spink J 07/22/2016

## 2016-07-22 NOTE — Progress Notes (Signed)
Case Management Chart review. Unit RN will apply pt's surgical wound vac prior to dc and pt will follow in office to have removed.Jonnie Finner RN CCM Case Mgmt phone [phone]

## 2016-10-23 ENCOUNTER — Other Ambulatory Visit: Payer: Self-pay | Admitting: Family Medicine

## 2016-10-23 DIAGNOSIS — Z1231 Encounter for screening mammogram for malignant neoplasm of breast: Secondary | ICD-10-CM

## 2016-11-28 ENCOUNTER — Encounter (INDEPENDENT_AMBULATORY_CARE_PROVIDER_SITE_OTHER): Payer: Self-pay

## 2016-12-07 ENCOUNTER — Ambulatory Visit
Admission: RE | Admit: 2016-12-07 | Discharge: 2016-12-07 | Disposition: A | Discharge status: Home / Self Care | Payer: BLUE CROSS/BLUE SHIELD | Source: Ambulatory Visit | Attending: Family Medicine | Admitting: Family Medicine

## 2016-12-07 DIAGNOSIS — Z1231 Encounter for screening mammogram for malignant neoplasm of breast: Secondary | ICD-10-CM

## 2017-03-08 ENCOUNTER — Encounter (INDEPENDENT_AMBULATORY_CARE_PROVIDER_SITE_OTHER): Payer: BLUE CROSS/BLUE SHIELD

## 2017-03-16 ENCOUNTER — Encounter (INDEPENDENT_AMBULATORY_CARE_PROVIDER_SITE_OTHER): Payer: BLUE CROSS/BLUE SHIELD | Admitting: Family Medicine

## 2017-03-16 ENCOUNTER — Encounter (INDEPENDENT_AMBULATORY_CARE_PROVIDER_SITE_OTHER): Payer: Self-pay

## 2017-03-23 ENCOUNTER — Ambulatory Visit (INDEPENDENT_AMBULATORY_CARE_PROVIDER_SITE_OTHER): Payer: BLUE CROSS/BLUE SHIELD | Admitting: Family Medicine

## 2017-03-28 ENCOUNTER — Ambulatory Visit (INDEPENDENT_AMBULATORY_CARE_PROVIDER_SITE_OTHER): Payer: BLUE CROSS/BLUE SHIELD | Admitting: Dietician

## 2017-04-04 ENCOUNTER — Ambulatory Visit (INDEPENDENT_AMBULATORY_CARE_PROVIDER_SITE_OTHER): Payer: BLUE CROSS/BLUE SHIELD | Admitting: Dietician

## 2017-04-11 ENCOUNTER — Ambulatory Visit (INDEPENDENT_AMBULATORY_CARE_PROVIDER_SITE_OTHER): Payer: BLUE CROSS/BLUE SHIELD | Admitting: Dietician

## 2017-04-18 ENCOUNTER — Ambulatory Visit (INDEPENDENT_AMBULATORY_CARE_PROVIDER_SITE_OTHER): Payer: BLUE CROSS/BLUE SHIELD | Admitting: Dietician

## 2017-11-01 ENCOUNTER — Other Ambulatory Visit: Payer: Self-pay | Admitting: Family Medicine

## 2017-11-01 DIAGNOSIS — Z1231 Encounter for screening mammogram for malignant neoplasm of breast: Secondary | ICD-10-CM

## 2017-12-06 ENCOUNTER — Encounter (INDEPENDENT_AMBULATORY_CARE_PROVIDER_SITE_OTHER): Payer: BLUE CROSS/BLUE SHIELD

## 2017-12-10 ENCOUNTER — Ambulatory Visit
Admission: RE | Admit: 2017-12-10 | Discharge: 2017-12-10 | Disposition: A | Discharge status: Home / Self Care | Payer: BLUE CROSS/BLUE SHIELD | Source: Ambulatory Visit | Attending: Family Medicine | Admitting: Family Medicine

## 2017-12-10 ENCOUNTER — Encounter (INDEPENDENT_AMBULATORY_CARE_PROVIDER_SITE_OTHER): Payer: Self-pay | Admitting: Family Medicine

## 2017-12-10 ENCOUNTER — Ambulatory Visit (INDEPENDENT_AMBULATORY_CARE_PROVIDER_SITE_OTHER): Payer: BLUE CROSS/BLUE SHIELD | Admitting: Family Medicine

## 2017-12-10 VITALS — BP 107/72 | HR 49 | Temp 97.4°F | Ht 67.0 in | Wt 197.0 lb

## 2017-12-10 DIAGNOSIS — E669 Obesity, unspecified: Secondary | ICD-10-CM

## 2017-12-10 DIAGNOSIS — R0602 Shortness of breath: Secondary | ICD-10-CM | POA: Diagnosis not present

## 2017-12-10 DIAGNOSIS — E639 Nutritional deficiency, unspecified: Secondary | ICD-10-CM | POA: Diagnosis not present

## 2017-12-10 DIAGNOSIS — R5383 Other fatigue: Secondary | ICD-10-CM | POA: Diagnosis not present

## 2017-12-10 DIAGNOSIS — R739 Hyperglycemia, unspecified: Secondary | ICD-10-CM | POA: Diagnosis not present

## 2017-12-10 DIAGNOSIS — Z1231 Encounter for screening mammogram for malignant neoplasm of breast: Secondary | ICD-10-CM

## 2017-12-10 DIAGNOSIS — Z1331 Encounter for screening for depression: Secondary | ICD-10-CM

## 2017-12-10 DIAGNOSIS — Z6830 Body mass index (BMI) 30.0-30.9, adult: Secondary | ICD-10-CM

## 2017-12-10 DIAGNOSIS — Z9189 Other specified personal risk factors, not elsewhere classified: Secondary | ICD-10-CM

## 2017-12-10 DIAGNOSIS — Z0289 Encounter for other administrative examinations: Secondary | ICD-10-CM

## 2017-12-11 ENCOUNTER — Encounter (INDEPENDENT_AMBULATORY_CARE_PROVIDER_SITE_OTHER): Payer: Self-pay | Admitting: Family Medicine

## 2017-12-11 LAB — CBC WITH DIFFERENTIAL
BASOS ABS: 0.1 10*3/uL (ref 0.0–0.2)
BASOS: 1 %
EOS (ABSOLUTE): 0.3 10*3/uL (ref 0.0–0.4)
Eos: 5 %
HEMOGLOBIN: 13.6 g/dL (ref 11.1–15.9)
Hematocrit: 40 % (ref 34.0–46.6)
IMMATURE GRANS (ABS): 0 10*3/uL (ref 0.0–0.1)
Immature Granulocytes: 0 %
Lymphocytes Absolute: 1.5 10*3/uL (ref 0.7–3.1)
Lymphs: 32 %
MCH: 32.5 pg (ref 26.6–33.0)
MCHC: 34 g/dL (ref 31.5–35.7)
MCV: 96 fL (ref 79–97)
MONOCYTES: 9 %
Monocytes Absolute: 0.4 10*3/uL (ref 0.1–0.9)
Neutrophils Absolute: 2.6 10*3/uL (ref 1.4–7.0)
Neutrophils: 53 %
RBC: 4.18 x10E6/uL (ref 3.77–5.28)
RDW: 11.9 % — ABNORMAL LOW (ref 12.3–15.4)
WBC: 4.8 10*3/uL (ref 3.4–10.8)

## 2017-12-11 LAB — COMPREHENSIVE METABOLIC PANEL
ALBUMIN: 4.5 g/dL (ref 3.5–5.5)
ALT: 17 IU/L (ref 0–32)
AST: 20 IU/L (ref 0–40)
Albumin/Globulin Ratio: 2 (ref 1.2–2.2)
Alkaline Phosphatase: 56 IU/L (ref 39–117)
BILIRUBIN TOTAL: 0.4 mg/dL (ref 0.0–1.2)
BUN / CREAT RATIO: 22 (ref 9–23)
BUN: 17 mg/dL (ref 6–24)
CALCIUM: 9.4 mg/dL (ref 8.7–10.2)
CHLORIDE: 102 mmol/L (ref 96–106)
CO2: 27 mmol/L (ref 20–29)
Creatinine, Ser: 0.78 mg/dL (ref 0.57–1.00)
GFR, EST AFRICAN AMERICAN: 101 mL/min/{1.73_m2} (ref >59)
GFR, EST NON AFRICAN AMERICAN: 88 mL/min/{1.73_m2} (ref >59)
GLUCOSE: 84 mg/dL (ref 65–99)
Globulin, Total: 2.3 g/dL (ref 1.5–4.5)
Potassium: 4.1 mmol/L (ref 3.5–5.2)
Sodium: 143 mmol/L (ref 134–144)
TOTAL PROTEIN: 6.8 g/dL (ref 6.0–8.5)

## 2017-12-11 LAB — LIPID PANEL WITH LDL/HDL RATIO
CHOLESTEROL TOTAL: 156 mg/dL (ref 100–199)
HDL: 60 mg/dL (ref >39)
LDL Calculated: 84 mg/dL (ref 0–99)
LDl/HDL Ratio: 1.4 ratio (ref 0.0–3.2)
TRIGLYCERIDES: 62 mg/dL (ref 0–149)
VLDL CHOLESTEROL CAL: 12 mg/dL (ref 5–40)

## 2017-12-11 LAB — HEMOGLOBIN A1C
Est. average glucose Bld gHb Est-mCnc: 103 mg/dL
Hgb A1c MFr Bld: 5.2 % (ref 4.8–5.6)

## 2017-12-11 LAB — MAGNESIUM: Magnesium: 2.3 mg/dL (ref 1.6–2.3)

## 2017-12-11 LAB — TSH: TSH: 2.53 u[IU]/mL (ref 0.450–4.500)

## 2017-12-11 LAB — VITAMIN D 25 HYDROXY (VIT D DEFICIENCY, FRACTURES): Vit D, 25-Hydroxy: 36.3 ng/mL (ref 30.0–100.0)

## 2017-12-11 LAB — T3: T3, Total: 104 ng/dL (ref 71–180)

## 2017-12-11 LAB — FOLATE: Folate: 16.9 ng/mL (ref >3.0)

## 2017-12-11 LAB — INSULIN, RANDOM: INSULIN: 8.1 u[IU]/mL (ref 2.6–24.9)

## 2017-12-11 LAB — VITAMIN B12: Vitamin B-12: 379 pg/mL (ref 232–1245)

## 2017-12-11 LAB — T4, FREE: FREE T4: 1.34 ng/dL (ref 0.82–1.77)

## 2017-12-11 NOTE — Progress Notes (Signed)
Office: [phone]  /  Fax: [phone]   Dear Dr. Dema Severin,   Thank you for referring [name] to our clinic. The following note includes my evaluation and treatment recommendations.  HPI:   Chief Complaint: OBESITY    [name] has been referred by Harlan Stains, MD for consultation regarding her obesity and obesity related comorbidities.    [name] (MR# [id number]) is a 53 y.o. female who presents on 12/11/2017 for obesity evaluation and treatment. Current BMI is Body mass index is 30.85 kg/m.Marland Kitchen [name] has been struggling with her weight for many years and has been unsuccessful in either losing weight, maintaining weight loss, or reaching her healthy weight goal.     [name] attended our information session and states she is currently in the action stage of change and ready to dedicate time achieving and maintaining a healthier weight. [name] is interested in becoming our patient and working on intensive lifestyle modifications including (but not limited to) diet, exercise and weight loss.    [name] states her family eats meals together she thinks her family will eat healthier with  her her desired weight loss is 24 lbs she has been heavy most of  her life she started gaining weight at age 78 or 89 her heaviest weight ever was 376 lbs. she has significant food cravings issues  she snacks frequently in the evenings she skips meals frequently she has problems with excessive hunger  she struggles with emotional eating    Status Post Gastric Bypass 2014 [name] is status post gastric bypass surgery in 2014. She lost from 356 pounds down to 184 pounds in eighteen months. She started regaining weight three years after her surgery. [name] had hypoglycemic dumping after her surgery, but this seems to have improved.  Fatigue [name] feels her energy is lower than it should be. This has worsened with weight gain and has not worsened recently. [name] admits to daytime somnolence and she  denies waking up still tired. Patient has a history of obstructive sleep apnea which may contribute to her fatigue. Patent has a history of symptoms of daytime fatigue. Patient generally gets 6 or 7 hours of sleep per night, and states they generally have nighttime awakenings. Snoring is not present. Apneic episodes are not present. Epworth Sleepiness Score is 5  Dyspnea on exertion [name] notes increasing shortness of breath with exercising and seems to be worsening over time with weight gain. She notes getting out of breath sooner with activity than she used to. This has not gotten worse recently. [name] denies orthopnea.  Hyperglycemia [name] has a history of some elevated blood glucose readings and was diagnosed with prediabetes previously.. She admits to polyphagia.  At risk for diabetes [name] is at higher than average risk for developing diabetes due to her obesity and hyperglycemia. She currently admits polyuria and denies polydipsia.  Nutritional Deficiency [name] is status post gastric bypass with malabsorption. She is currently on bariatric vitamins.  Depression Screen [name]'s Food and Mood (modified PHQ-9) score was  Depression screen PHQ 2/9 12/10/2017  Decreased Interest 0  Down, Depressed, Hopeless 0  PHQ - 2 Score 0  Altered sleeping 1  Tired, decreased energy 1  Change in appetite 1  Feeling bad or failure about yourself  0  Trouble concentrating 1  Moving slowly or fidgety/restless 0  Suicidal thoughts 0  PHQ-9 Score 4  Difficult doing work/chores Not difficult at all    ALLERGIES: No Known Allergies  MEDICATIONS: Current Outpatient  Medications on File Prior to Visit  Medication Sig Dispense Refill  . Calcium Citrate-Vitamin D (CALCIUM CITRATE +D PO) Take 1 tablet by mouth 2 (two) times daily.    . Cyanocobalamin (B-12) 500 MCG SUBL Place 500 mcg under the tongue 3 (three) times a week.    . magnesium oxide (MAG-OX) 400 MG tablet Take 400 mg by mouth daily.    .  Multiple Vitamins-Minerals (CELEBRATE MULTI-COMPLETE 36) CAPS Take 1 tablet by mouth daily.    . pantoprazole (PROTONIX) 40 MG tablet Take 1 tablet (40 mg total) by mouth daily as needed (heartburn). 50 tablet 2   No current facility-administered medications on file prior to visit.     PAST MEDICAL HISTORY: Past Medical History:  Diagnosis Date  . Acid reflux 07/22/2012   much improved with Bariatric diet  . Anemia    Hgb 5 02/18/10, blood transfusion 02/18/10  . Anxiety   . Back pain   . Cervical radiculopathy at C6 01/25/2011  . Cystocele   . Deafness in left ear   . Depression   . Disc disease, degenerative, lumbar or lumbosacral   . Dysfunctional uterine bleeding    resolved after  Hysterectomy(hx. fibroids)  . Dyspnea   . Fatty liver   . Gallbladder problem   . History of reactive hypoglycemia 2017  . Hypertension   . IBS (irritable bowel syndrome)   . Joint pain   . Lateral epicondylitis  of elbow 02/01/2011  . Leg edema   . Morbid obesity ([place])   . OAB (overactive bladder)   . Obesity, Class III, BMI 40-49.9 (morbid obesity) ([place])   . OSA on CPAP    cpap used nightly, settings 13  . PCOS (polycystic ovarian syndrome)   . PONV (postoperative nausea and vomiting)    no PONV with last procedure when using "patch behind my ear", does endorse extreme pain following hysterectomy , states" i think they managed by pain poorly bc they were worried about my sleep apnea"  . Prediabetes   . Rectocele    stress incontinence  . Syncope 10/2015    PAST SURGICAL HISTORY: Past Surgical History:  Procedure Laterality Date  . ABDOMINAL HYSTERECTOMY    . APPLICATION OF WOUND VAC N/A 07/20/2016   Procedure: APPLICATION OF WOUND VAC;  Surgeon: Excell Seltzer, MD;  Location: [facility];  Service: General;  Laterality: N/A;  . CHOLECYSTECTOMY  1993   laparoscopic  . COLONOSCOPY     2017   . GASTRIC ROUX-EN-Y N/A 12/23/2012   Procedure: LAPAROSCOPIC ROUX-EN-Y GASTRIC BYPASS WITH UPPER  ENDOSCOPY;  Surgeon: Madilyn Hook, DO;  Location: [facility];  Service: General;  Laterality: N/A;  . INSERTION OF MESH N/A 07/20/2016   Procedure: INSERTION OF MESH;  Surgeon: Excell Seltzer, MD;  Location: [facility];  Service: General;  Laterality: N/A;  . PANNICULECTOMY N/A 07/20/2016   Procedure: PANNICULECTOMY;  Surgeon: Excell Seltzer, MD;  Location: [facility];  Service: General;  Laterality: N/A;  . UPPER GI ENDOSCOPY N/A 12/23/2012   Procedure: UPPER GI ENDOSCOPY;  Surgeon: Madilyn Hook, DO;  Location: [facility];  Service: General;  Laterality: N/A;  . [place]  . VENTRAL HERNIA REPAIR N/A 07/20/2016   Procedure: REPAIR OF VENTAL HERNIA WITH MESH;  Surgeon: Excell Seltzer, MD;  Location: [facility];  Service: General;  Laterality: N/A;    SOCIAL HISTORY: Social History   Tobacco Use  . Smoking status: Former Smoker    Types: Cigarettes  Last attempt to quit: 02/14/1995    Years since quitting: 22.8  . Smokeless tobacco: Never Used  Substance Use Topics  . Alcohol use: No  . Drug use: No    FAMILY HISTORY: Family History  Problem Relation Age of Onset  . Hyperlipidemia Mother   . Hypertension Mother   . Depression Mother   . Obesity Mother   . Hyperlipidemia Father   . Hypertension Father   . Heart attack Father 68       5-bypass surgery  . Heart disease Father   . Obesity Father   . Thyroid disease Father   . Cancer Paternal Uncle        colon  . Colon cancer Maternal Uncle   . Diabetes Maternal Aunt     ROS: Review of Systems  Constitutional: Positive for malaise/fatigue.  Eyes:       + Wear Glasses or Contacts  Respiratory: Positive for shortness of breath (on exertion).   Cardiovascular: Negative for orthopnea.       + Leg Cramping + Very Cold Feet or Hands  Genitourinary: Positive for frequency.  Endo/Heme/Allergies: Negative for polydipsia.       Positive for polyphagia  Psychiatric/Behavioral: The patient has insomnia.        + Stress      PHYSICAL EXAM: Blood pressure 107/72, pulse (!) 49, temperature (!) 97.4 F (36.3 C), temperature source Oral, height 5\' 7"  (1.702 m), weight 197 lb (89.4 kg), last menstrual period 03/21/2010, SpO2 100 %. Body mass index is 30.85 kg/m. Physical Exam  Constitutional: She is oriented to person, place, and time. She appears well-developed and well-nourished.  HENT:  Head: Normocephalic and atraumatic.  Nose: Nose normal.  Eyes: EOM are normal. No scleral icterus.  Neck: Normal range of motion. Neck supple. No thyromegaly present.  Cardiovascular: Regular rhythm. Bradycardia present.  Pulmonary/Chest: Effort normal. No respiratory distress.  Abdominal: Soft. There is no tenderness.  + Obesity  Musculoskeletal: Normal range of motion.  Range of Motion normal in all 4 extremities  Neurological: She is alert and oriented to person, place, and time. Coordination normal.  Skin: Skin is warm and dry.  Psychiatric: She has a normal mood and affect.  Vitals reviewed.   RECENT LABS AND TESTS: BMET    Component Value Date/Time   NA 143 12/10/2017 1101   K 4.1 12/10/2017 1101   CL 102 12/10/2017 1101   CO2 27 12/10/2017 1101   GLUCOSE 84 12/10/2017 1101   GLUCOSE 106 (H) 10/21/2015 0433   BUN 17 12/10/2017 1101   CREATININE 0.78 12/10/2017 1101   CREATININE 0.68 02/19/2014 1505   CALCIUM 9.4 12/10/2017 1101   GFRNONAA 88 12/10/2017 1101   GFRAA 101 12/10/2017 1101   Lab Results  Component Value Date   HGBA1C 5.2 12/10/2017   Lab Results  Component Value Date   INSULIN 8.1 12/10/2017   CBC    Component Value Date/Time   WBC 4.8 12/10/2017 1101   WBC 8.6 07/21/2016 0518   RBC 4.18 12/10/2017 1101   RBC 3.57 (L) 07/21/2016 0518   HGB 13.6 12/10/2017 1101   HCT 40.0 12/10/2017 1101   PLT 176 07/21/2016 0518   MCV 96 12/10/2017 1101   MCH 32.5 12/10/2017 1101   MCH 32.5 07/21/2016 0518   MCHC 34.0 12/10/2017 1101   MCHC 33.6 07/21/2016 0518   RDW 11.9 (L) 12/10/2017  1101   LYMPHSABS 1.5 12/10/2017 1101   MONOABS 0.4 02/19/2014 1505  EOSABS 0.3 12/10/2017 1101   BASOSABS 0.1 12/10/2017 1101   Iron/TIBC/Ferritin/ %Sat    Component Value Date/Time   IRON 108 02/19/2014 1505   TIBC 263 02/19/2014 1505   IRONPCTSAT 41 02/19/2014 1505   Lipid Panel     Component Value Date/Time   CHOL 156 12/10/2017 1101   TRIG 62 12/10/2017 1101   HDL 60 12/10/2017 1101   CHOLHDL 2.5 02/19/2014 1505   VLDL 14 02/19/2014 1505   LDLCALC 84 12/10/2017 1101   Hepatic Function Panel     Component Value Date/Time   PROT 6.8 12/10/2017 1101   ALBUMIN 4.5 12/10/2017 1101   AST 20 12/10/2017 1101   ALT 17 12/10/2017 1101   ALKPHOS 56 12/10/2017 1101   BILITOT 0.4 12/10/2017 1101      Component Value Date/Time   TSH 2.530 12/10/2017 1101   TSH 1.083 10/20/2015 1335   TSH 3.345 06/30/2013 1012   TSH 2.974 10/10/2012 1104    ECG  shows NSR with a rate of 50 BPM INDIRECT CALORIMETER done today shows a VO2 of 294 and a REE of 2045.  Her calculated basal metabolic rate is 4970 thus her basal metabolic rate is better than expected.    ASSESSMENT AND PLAN: Other fatigue - Plan: EKG 12-Lead, CBC With Differential, Comprehensive metabolic panel, T3, T4, free, TSH, VITAMIN D 25 Hydroxy (Vit-D Deficiency, Fractures)  Shortness of breath on exertion - Plan: Lipid Panel With LDL/HDL Ratio  Hyperglycemia - Plan: Hemoglobin A1c, Insulin, random  Nutritional deficiency - Plan: Vitamin B12, Folate, VITAMIN D 25 Hydroxy (Vit-D Deficiency, Fractures), Magnesium  Depression screening  At risk for diabetes mellitus  Class 1 obesity with serious comorbidity and body mass index (BMI) of 30.0 to 30.9 in adult, unspecified obesity type  PLAN: Fatigue Larosa was informed that her fatigue may be related to obesity, depression or many other causes. Labs will be ordered, and in the meanwhile Stanisha has agreed to work on diet, exercise and weight loss to help with fatigue.  Proper sleep hygiene was discussed including the need for 7-8 hours of quality sleep each night. A sleep study was not ordered based on symptoms and Epworth score.  Dyspnea on exertion Clotiel's shortness of breath appears to be obesity related and exercise induced. She has agreed to work on weight loss and gradually increase exercise to treat her exercise induced shortness of breath. If Myrella follows our instructions and loses weight without improvement of her shortness of breath, we will plan to refer to pulmonology. We will monitor this condition regularly. Deerica agrees to this plan.  Hyperglycemia Fasting labs will be obtained and results with be discussed with [name] in 2 weeks at her follow up visit. In the meanwhile Annalisse was started on a lower simple carbohydrate diet and will work on weight loss efforts.  Diabetes risk counseling Almendra was given extended (15 minutes) diabetes prevention counseling today. She is 52 y.o. female and has risk factors for diabetes including obesity and hyperglycemia. We discussed intensive lifestyle modifications today with an emphasis on weight loss as well as increasing exercise and decreasing simple carbohydrates in her diet.  Nutritional Deficiency We will check labs and Nalini will continue to take her bariatric vitamins for now. Omie agrees to follow up with our clinic in 2 weeks.  Depression Screen Etola had a negative depression screening. Depression is commonly associated with obesity and often results in emotional eating behaviors. We will monitor this closely and work on CBT to  help improve the non-hunger eating patterns. Referral to Psychology may be required if no improvement is seen as she continues in our clinic.  Obesity Shameria is currently in the action stage of change and her goal is to continue with weight loss efforts. I recommend Zurie begin the structured treatment plan as follows:  She has agreed to follow the Category 2 plan Gracelynne has  been instructed to eventually work up to a goal of 150 minutes of combined cardio and strengthening exercise per week for weight loss and overall health benefits. We discussed the following Behavioral Modification Strategies today: increasing lean protein intake, decreasing simple carbohydrates  and work on meal planning and easy cooking plans   She was informed of the importance of frequent follow up visits to maximize her success with intensive lifestyle modifications for her multiple health conditions. She was informed we would discuss her lab results at her next visit unless there is a critical issue that needs to be addressed sooner. Zarina agreed to keep her next visit at the agreed upon time to discuss these results.    OBESITY BEHAVIORAL INTERVENTION VISIT  Today's visit was # 1   Starting weight: 197 lbs Starting date: 12/10/17 Today's weight : 197 lbs Today's date: 12/10/2017 Total lbs lost to date: 0   ASK: We discussed the diagnosis of obesity with Jefm Miles [place] today and Tanessa agreed to give [place] permission to discuss obesity behavioral modification therapy today.  ASSESS: Ethelreda has the diagnosis of obesity and her BMI today is 30.85 Sammantha is in the action stage of change   ADVISE: Tonique was educated on the multiple health risks of obesity as well as the benefit of weight loss to improve her health. She was advised of the need for long term treatment and the importance of lifestyle modifications to improve her current health and to decrease her risk of future health problems.  AGREE: Multiple dietary modification options and treatment options were discussed and  Xenia agreed to follow the recommendations documented in the above note.  ARRANGE: Emilina was educated on the importance of frequent visits to treat obesity as outlined per CMS and USPSTF guidelines and agreed to schedule her next follow up appointment today.  I, Doreene Nest, am acting as transcriptionist for  Dennard Nip, MD   I have reviewed the above documentation for accuracy and completeness, and I agree with the above. -Dennard Nip, MD

## 2017-12-24 ENCOUNTER — Ambulatory Visit (INDEPENDENT_AMBULATORY_CARE_PROVIDER_SITE_OTHER): Payer: BLUE CROSS/BLUE SHIELD | Admitting: Family Medicine

## 2017-12-24 VITALS — BP 96/67 | HR 61 | Temp 98.2°F | Ht 67.0 in | Wt 193.0 lb

## 2017-12-24 DIAGNOSIS — Z9189 Other specified personal risk factors, not elsewhere classified: Secondary | ICD-10-CM | POA: Diagnosis not present

## 2017-12-24 DIAGNOSIS — E559 Vitamin D deficiency, unspecified: Secondary | ICD-10-CM | POA: Diagnosis not present

## 2017-12-24 DIAGNOSIS — E8881 Metabolic syndrome: Secondary | ICD-10-CM

## 2017-12-24 DIAGNOSIS — K219 Gastro-esophageal reflux disease without esophagitis: Secondary | ICD-10-CM

## 2017-12-24 DIAGNOSIS — Z9889 Other specified postprocedural states: Secondary | ICD-10-CM

## 2017-12-24 DIAGNOSIS — Z6830 Body mass index (BMI) 30.0-30.9, adult: Secondary | ICD-10-CM

## 2017-12-24 DIAGNOSIS — E669 Obesity, unspecified: Secondary | ICD-10-CM

## 2017-12-24 MED ORDER — VITAMIN D (ERGOCALCIFEROL) 1.25 MG (50000 UNIT) PO CAPS: 50000.0000 [IU] | ORAL_CAPSULE | ORAL | 0 refills | Status: DC

## 2017-12-26 NOTE — Progress Notes (Signed)
Office: [phone]  /  Fax: [phone]   HPI:   Chief Complaint: OBESITY [name] is here to discuss her progress with her obesity treatment plan. She is on the Category 2 plan and is following her eating plan approximately 95 % of the time. She states she is walking for 20 minutes 1-2 times per week. [name] did very well with weight loss on her Category 2 plan. She struggled with meal planning and prepping. Her hunger was controlled. She is status post Rouxen-y weight loss surgery.  Her weight is 193 lb (87.5 kg) today and has had a weight loss of 4 pounds over a period of 2 weeks since her last visit. She has lost 4 lbs since starting treatment with [place].  Vitamin D Deficiency Shadow has a diagnosis of vitamin D deficiency. She is on multivitamins, but level is not at goal. She notes fatigue and denies nausea, vomiting or muscle weakness.  GERD Ginger is stable on Protenix. She is working on weight loss, and symptoms are well controlled.  Insulin Resistance [name] has a diagnosis of insulin resistance based on her mildly elevated fasting insulin level at 8, but normal A1c and glucose. Although [name]'s blood glucose readings are still under good control, insulin resistance puts her at greater risk of metabolic syndrome and diabetes. She is not taking metformin currently and notes decreased polyphagia while on Category 2 plan. She continues to work on diet and exercise to decrease risk of diabetes.  At risk for diabetes [name] is at higher than average risk for developing diabetes due to her obesity and insulin resistance. She currently denies polyuria or polydipsia.  ALLERGIES: No Known Allergies  MEDICATIONS: Current Outpatient Medications on File Prior to Visit  Medication Sig Dispense Refill  . Calcium Citrate-Vitamin D (CALCIUM CITRATE +D PO) Take 1 tablet by mouth 2 (two) times daily.    . Cyanocobalamin (B-12) 500 MCG SUBL Place 500 mcg under the tongue 3 (three) times a week.    .  magnesium oxide (MAG-OX) 400 MG tablet Take 400 mg by mouth daily.    . Multiple Vitamins-Minerals (CELEBRATE MULTI-COMPLETE 36) CAPS Take 1 tablet by mouth daily.    . pantoprazole (PROTONIX) 40 MG tablet Take 1 tablet (40 mg total) by mouth daily as needed (heartburn). 50 tablet 2   No current facility-administered medications on file prior to visit.     PAST MEDICAL HISTORY: Past Medical History:  Diagnosis Date  . Acid reflux 07/22/2012   much improved with Bariatric diet  . Anemia    Hgb 5 02/18/10, blood transfusion 02/18/10  . Anxiety   . Back pain   . Cervical radiculopathy at C6 01/25/2011  . Cystocele   . Deafness in left ear   . Depression   . Disc disease, degenerative, lumbar or lumbosacral   . Dysfunctional uterine bleeding    resolved after  Hysterectomy(hx. fibroids)  . Dyspnea   . Fatty liver   . Gallbladder problem   . History of reactive hypoglycemia 2017  . Hypertension   . IBS (irritable bowel syndrome)   . Joint pain   . Lateral epicondylitis  of elbow 02/01/2011  . Leg edema   . Morbid obesity ([place])   . OAB (overactive bladder)   . Obesity, Class III, BMI 40-49.9 (morbid obesity) ([place])   . OSA on CPAP    cpap used nightly, settings 13  . PCOS (polycystic ovarian syndrome)   . PONV (postoperative nausea and vomiting)  no PONV with last procedure when using "patch behind my ear", does endorse extreme pain following hysterectomy , states" i think they managed by pain poorly bc they were worried about my sleep apnea"  . Prediabetes   . Rectocele    stress incontinence  . Syncope 10/2015    PAST SURGICAL HISTORY: Past Surgical History:  Procedure Laterality Date  . ABDOMINAL HYSTERECTOMY    . APPLICATION OF WOUND VAC N/A 07/20/2016   Procedure: APPLICATION OF WOUND VAC;  Surgeon: Excell Seltzer, MD;  Location: [facility];  Service: General;  Laterality: N/A;  . CHOLECYSTECTOMY  1993   laparoscopic  . COLONOSCOPY     2017   . GASTRIC ROUX-EN-Y N/A  12/23/2012   Procedure: LAPAROSCOPIC ROUX-EN-Y GASTRIC BYPASS WITH UPPER ENDOSCOPY;  Surgeon: Madilyn Hook, DO;  Location: [facility];  Service: General;  Laterality: N/A;  . INSERTION OF MESH N/A 07/20/2016   Procedure: INSERTION OF MESH;  Surgeon: Excell Seltzer, MD;  Location: [facility];  Service: General;  Laterality: N/A;  . PANNICULECTOMY N/A 07/20/2016   Procedure: PANNICULECTOMY;  Surgeon: Excell Seltzer, MD;  [facility]: [facility];  Service: General;  Laterality: N/A;  . UPPER GI ENDOSCOPY N/A 12/23/2012   Procedure: UPPER GI ENDOSCOPY;  Surgeon: Madilyn Hook, DO;  Location: [facility];  Service: General;  Laterality: N/A;  . [place]  . VENTRAL HERNIA REPAIR N/A 07/20/2016   Procedure: REPAIR OF VENTAL HERNIA WITH MESH;  Surgeon: Excell Seltzer, MD;  Location: [facility];  Service: General;  Laterality: N/A;    SOCIAL HISTORY: Social History   Tobacco Use  . Smoking status: Former Smoker    Types: Cigarettes    Last attempt to quit: 02/14/1995    Years since quitting: 22.8  . Smokeless tobacco: Never Used  Substance Use Topics  . Alcohol use: No  . Drug use: No    FAMILY HISTORY: Family History  Problem Relation Age of Onset  . Hyperlipidemia Mother   . Hypertension Mother   . Depression Mother   . Obesity Mother   . Hyperlipidemia Father   . Hypertension Father   . Heart attack Father 35       5-bypass surgery  . Heart disease Father   . Obesity Father   . Thyroid disease Father   . Cancer Paternal Uncle        colon  . Colon cancer Maternal Uncle   . Diabetes Maternal Aunt     ROS: Review of Systems  Constitutional: Positive for malaise/fatigue and weight loss.  Gastrointestinal: Negative for nausea and vomiting.  Genitourinary: Negative for frequency.  Musculoskeletal:       Negative muscle weakness  Endo/Heme/Allergies: Negative for polydipsia.       Positive polyphagia    PHYSICAL EXAM: Blood pressure 96/67, pulse 61, temperature 98.2 F (36.8  C), temperature source Oral, height 5\' 7"  (1.702 m), weight 193 lb (87.5 kg), last menstrual period 03/21/2010, SpO2 97 %. Body mass index is 30.23 kg/m. Physical Exam  Constitutional: She is oriented to person, place, and time. She appears well-developed and well-nourished.  Cardiovascular: Normal rate.  Pulmonary/Chest: Effort normal.  Musculoskeletal: Normal range of motion.  Neurological: She is oriented to person, place, and time.  Skin: Skin is warm and dry.  Psychiatric: She has a normal mood and affect. Her behavior is normal.  Vitals reviewed.   RECENT LABS AND TESTS: BMET    Component Value Date/Time   NA 143 12/10/2017 1101  K 4.1 12/10/2017 1101   CL 102 12/10/2017 1101   CO2 27 12/10/2017 1101   GLUCOSE 84 12/10/2017 1101   GLUCOSE 106 (H) 10/21/2015 0433   BUN 17 12/10/2017 1101   CREATININE 0.78 12/10/2017 1101   CREATININE 0.68 02/19/2014 1505   CALCIUM 9.4 12/10/2017 1101   GFRNONAA 88 12/10/2017 1101   GFRAA 101 12/10/2017 1101   Lab Results  Component Value Date   HGBA1C 5.2 12/10/2017   HGBA1C 5.0 10/20/2015   HGBA1C 5.1 06/30/2013   HGBA1C 5.9 (H) 10/10/2012   Lab Results  Component Value Date   INSULIN 8.1 12/10/2017   CBC    Component Value Date/Time   WBC 4.8 12/10/2017 1101   WBC 8.6 07/21/2016 0518   RBC 4.18 12/10/2017 1101   RBC 3.57 (L) 07/21/2016 0518   HGB 13.6 12/10/2017 1101   HCT 40.0 12/10/2017 1101   PLT 176 07/21/2016 0518   MCV 96 12/10/2017 1101   MCH 32.5 12/10/2017 1101   MCH 32.5 07/21/2016 0518   MCHC 34.0 12/10/2017 1101   MCHC 33.6 07/21/2016 0518   RDW 11.9 (L) 12/10/2017 1101   LYMPHSABS 1.5 12/10/2017 1101   MONOABS 0.4 02/19/2014 1505   EOSABS 0.3 12/10/2017 1101   BASOSABS 0.1 12/10/2017 1101   Iron/TIBC/Ferritin/ %Sat    Component Value Date/Time   IRON 108 02/19/2014 1505   TIBC 263 02/19/2014 1505   IRONPCTSAT 41 02/19/2014 1505   Lipid Panel     Component Value Date/Time   CHOL 156  12/10/2017 1101   TRIG 62 12/10/2017 1101   HDL 60 12/10/2017 1101   CHOLHDL 2.5 02/19/2014 1505   VLDL 14 02/19/2014 1505   LDLCALC 84 12/10/2017 1101   Hepatic Function Panel     Component Value Date/Time   PROT 6.8 12/10/2017 1101   ALBUMIN 4.5 12/10/2017 1101   AST 20 12/10/2017 1101   ALT 17 12/10/2017 1101   ALKPHOS 56 12/10/2017 1101   BILITOT 0.4 12/10/2017 1101      Component Value Date/Time   TSH 2.530 12/10/2017 1101   TSH 1.083 10/20/2015 1335   TSH 3.345 06/30/2013 1012   TSH 2.974 10/10/2012 1104  Results for Mase, MADLYN CROSBY (MRN [id number]) as of 12/26/2017 07:17  Ref. Range 12/10/2017 11:01  Vitamin D, 25-Hydroxy Latest Ref Range: 30.0 - 100.0 ng/mL 36.3    ASSESSMENT AND PLAN: Vitamin D deficiency - Plan: Vitamin D, Ergocalciferol, (DRISDOL) 1.25 MG (50000 UT) CAPS capsule  Gastroesophageal reflux disease, esophagitis presence not specified  Insulin resistance  Status post gastric surgery  At risk for diabetes mellitus  Class 1 obesity with serious comorbidity and body mass index (BMI) of 30.0 to 30.9 in adult, unspecified obesity type  PLAN:  Vitamin D Deficiency Nga was informed that low vitamin D levels contributes to fatigue and are associated with obesity, breast, and colon cancer. Reanne agrees to start prescription Vit D @50 ,000 IU every week #4 with no refills. She will follow up for routine testing of vitamin D, at least 2-3 times per year. She was informed of the risk of over-replacement of vitamin D and agrees to not increase her dose unless she discusses this with [place] first. We will recheck labs in 3 months. Linea agrees to follow up with our clinic in 3 weeks.  GERD Fiora agrees to continue taking Protenix and she will continue with weight loss efforts. Adrianne agrees to follow up with our clinic in 3 weeks.  Insulin Resistance Olivia Mackie  will continue to work on weight loss, diet, exercise, and decreasing simple carbohydrates in her diet to help  decrease the risk of diabetes. We dicussed metformin including benefits and risks. She was informed that eating too many simple carbohydrates or too many calories at one sitting increases the likelihood of GI side effects. Aamilah declined metformin for now and prescription was not written today. Tametria agrees to follow up with our clinic in 3 weeks as directed to monitor her progress.  Diabetes risk counselling Elody was given extended (30 minutes) diabetes prevention counseling today. She is 52 y.o. female and has risk factors for diabetes including obesity and insulin resistance. We discussed intensive lifestyle modifications today with an emphasis on weight loss as well as increasing exercise and decreasing simple carbohydrates in her diet.  Obesity Hannie is currently in the action stage of change. As such, her goal is to continue with weight loss efforts She has agreed to follow her cat 3 plan Leisel has been instructed to work up to a goal of 150 minutes of combined cardio and strengthening exercise per week for weight loss and overall health benefits. We discussed the following Behavioral Modification Strategies today: work on meal planning and easy cooking plans, keeping healthy foods in the home, and holiday eating strategies    Adelyne has agreed to follow up with our clinic in 3 weeks. She was informed of the importance of frequent follow up visits to maximize her success with intensive lifestyle modifications for her multiple health conditions.   OBESITY BEHAVIORAL INTERVENTION VISIT  Today's visit was # 2   Starting weight: 197 lbs Starting date: 12/10/17 Today's weight : 193 lbs  Today's date: 12/24/2017 Total lbs lost to date: 4    ASK: We discussed the diagnosis of obesity with Jefm Miles Yepiz today and Reign agreed to give [place] permission to discuss obesity behavioral modification therapy today.  ASSESS: Shley has the diagnosis of obesity and her BMI today is 30.22 Kylan is in  the action stage of change   ADVISE: Trenese was educated on the multiple health risks of obesity as well as the benefit of weight loss to improve her health. She was advised of the need for long term treatment and the importance of lifestyle modifications to improve her current health and to decrease her risk of future health problems.  AGREE: Multiple dietary modification options and treatment options were discussed and  Omaira agreed to follow the recommendations documented in the above note.  ARRANGE: [place] was educated on the importance of frequent visits to treat obesity as outlined per CMS and USPSTF guidelines and agreed to schedule her next follow up appointment today.  I, Trixie Dredge, am acting as transcriptionist for Dennard Nip, MD  I have reviewed the above documentation for accuracy and completeness, and I agree with the above. -Dennard Nip, MD

## 2018-01-16 ENCOUNTER — Ambulatory Visit (INDEPENDENT_AMBULATORY_CARE_PROVIDER_SITE_OTHER): Payer: BLUE CROSS/BLUE SHIELD | Admitting: Family Medicine

## 2018-01-16 VITALS — BP 105/71 | HR 64 | Temp 97.5°F | Ht 67.0 in | Wt 190.0 lb

## 2018-01-16 DIAGNOSIS — Z6830 Body mass index (BMI) 30.0-30.9, adult: Secondary | ICD-10-CM | POA: Diagnosis not present

## 2018-01-16 DIAGNOSIS — E559 Vitamin D deficiency, unspecified: Secondary | ICD-10-CM | POA: Diagnosis not present

## 2018-01-16 DIAGNOSIS — E669 Obesity, unspecified: Secondary | ICD-10-CM

## 2018-01-16 DIAGNOSIS — Z9189 Other specified personal risk factors, not elsewhere classified: Secondary | ICD-10-CM | POA: Diagnosis not present

## 2018-01-16 MED ORDER — VITAMIN D (ERGOCALCIFEROL) 1.25 MG (50000 UNIT) PO CAPS: 50000.0000 [IU] | ORAL_CAPSULE | ORAL | 0 refills | Status: AC

## 2018-01-18 NOTE — Progress Notes (Signed)
Office: [phone]  /  Fax: [phone]   HPI:   Chief Complaint: OBESITY [name] is here to discuss her progress with her obesity treatment plan. She is on the  follow the Category 2 plan and is following her eating plan approximately 85-90 % of the time. She states she is exercising 15-20 minutes 2-3 times per week. [name] has done  Well with weight loss on the Category 2 meal plan over Thanksgiving. Hunger is controlled but she still struggles with eating all her food. She also makes cookies for christmas and is worries about extra temptations.  Her weight is 190 lb (86.2 kg) today and has had a weight loss of 3 pounds over a period of 2 weeks since her last visit. She has lost 7 lbs since starting treatment with [place].  Vitamin D deficiency [name] has a diagnosis of vitamin D deficiency. She is currently taking vit D and denies nausea, vomiting or muscle weakness.  At risk for cardiovascular disease [name] is at a higher than average risk for cardiovascular disease due to obesity. She currently denies any chest pain.   ALLERGIES: No Known Allergies  MEDICATIONS: Current Outpatient Medications on File Prior to Visit  Medication Sig Dispense Refill  . Calcium Citrate-Vitamin D (CALCIUM CITRATE +D PO) Take 1 tablet by mouth 2 (two) times daily.    . Cyanocobalamin (B-12) 500 MCG SUBL Place 500 mcg under the tongue 3 (three) times a week.    . magnesium oxide (MAG-OX) 400 MG tablet Take 400 mg by mouth daily.    . Multiple Vitamins-Minerals (CELEBRATE MULTI-COMPLETE 36) CAPS Take 1 tablet by mouth daily.    . pantoprazole (PROTONIX) 40 MG tablet Take 1 tablet (40 mg total) by mouth daily as needed (heartburn). 50 tablet 2   No current facility-administered medications on file prior to visit.     PAST MEDICAL HISTORY: Past Medical History:  Diagnosis Date  . Acid reflux 07/22/2012   much improved with Bariatric diet  . Anemia    Hgb 5 02/18/10, blood transfusion 02/18/10  . Anxiety     . Back pain   . Cervical radiculopathy at C6 01/25/2011  . Cystocele   . Deafness in left ear   . Depression   . Disc disease, degenerative, lumbar or lumbosacral   . Dysfunctional uterine bleeding    resolved after  Hysterectomy(hx. fibroids)  . Dyspnea   . Fatty liver   . Gallbladder problem   . History of reactive hypoglycemia 2017  . Hypertension   . IBS (irritable bowel syndrome)   . Joint pain   . Lateral epicondylitis  of elbow 02/01/2011  . Leg edema   . Morbid obesity ([place])   . OAB (overactive bladder)   . Obesity, Class III, BMI 40-49.9 (morbid obesity) ([place])   . OSA on CPAP    cpap used nightly, settings 13  . PCOS (polycystic ovarian syndrome)   . PONV (postoperative nausea and vomiting)    no PONV with last procedure when using "patch behind my ear", does endorse extreme pain following hysterectomy , states" i think they managed by pain poorly bc they were worried about my sleep apnea"  . Prediabetes   . Rectocele    stress incontinence  . Syncope 10/2015    PAST SURGICAL HISTORY: Past Surgical History:  Procedure Laterality Date  . ABDOMINAL HYSTERECTOMY    . APPLICATION OF WOUND VAC N/A 07/20/2016   Procedure: APPLICATION OF WOUND VAC;  Surgeon: Excell Seltzer, MD;  Location: [facility];  Service: General;  Laterality: N/A;  . CHOLECYSTECTOMY  1993   laparoscopic  . COLONOSCOPY     2017   . GASTRIC ROUX-EN-Y N/A 12/23/2012   Procedure: LAPAROSCOPIC ROUX-EN-Y GASTRIC BYPASS WITH UPPER ENDOSCOPY;  Surgeon: Madilyn Hook, DO;  Location: [facility];  Service: General;  Laterality: N/A;  . INSERTION OF MESH N/A 07/20/2016   Procedure: INSERTION OF MESH;  Surgeon: Excell Seltzer, MD;  Location: [facility];  Service: General;  Laterality: N/A;  . PANNICULECTOMY N/A 07/20/2016   Procedure: PANNICULECTOMY;  Surgeon: Excell Seltzer, MD;  [facility];  Service: General;  Laterality: N/A;  . UPPER GI ENDOSCOPY N/A 12/23/2012   Procedure: UPPER GI ENDOSCOPY;   Surgeon: Madilyn Hook, DO;  [facility];  Service: General;  Laterality: N/A;  . [place]  . VENTRAL HERNIA REPAIR N/A 07/20/2016   Procedure: REPAIR OF VENTAL HERNIA WITH MESH;  Surgeon: Excell Seltzer, MD;  Location[facility];  Service: General;  Laterality: N/A;    SOCIAL HISTORY: Social History   Tobacco Use  . Smoking status: Former Smoker    Types: Cigarettes    Last attempt to quit: 02/14/1995    Years since quitting: 22.9  . Smokeless tobacco: Never Used  Substance Use Topics  . Alcohol use: No  . Drug use: No    FAMILY HISTORY: Family History  Problem Relation Age of Onset  . Hyperlipidemia Mother   . Hypertension Mother   . Depression Mother   . Obesity Mother   . Hyperlipidemia Father   . Hypertension Father   . Heart attack Father 8       5-bypass surgery  . Heart disease Father   . Obesity Father   . Thyroid disease Father   . Cancer Paternal Uncle        colon  . Colon cancer Maternal Uncle   . Diabetes Maternal Aunt     ROS: Review of Systems  Constitutional: Positive for weight loss.  All other systems reviewed and are negative.   PHYSICAL EXAM: Blood pressure 105/71, pulse 64, temperature (!) 97.5 F (36.4 C), temperature source Oral, height 5\' 7"  (1.702 m), weight 190 lb (86.2 kg), last menstrual period 03/21/2010, SpO2 97 %. Body mass index is 29.76 kg/m. Physical Exam  Constitutional: She is oriented to person, place, and time. She appears well-developed and well-nourished.  HENT:  Head: Normocephalic.  Eyes: Pupils are equal, round, and reactive to light.  Neck: Normal range of motion.  Cardiovascular: Normal rate.  Pulmonary/Chest: Effort normal.  Musculoskeletal: Normal range of motion.  Neurological: She is alert and oriented to person, place, and time.  Skin: Skin is warm.  Psychiatric: She has a normal mood and affect.  Vitals reviewed.   RECENT LABS AND TESTS: BMET    Component Value Date/Time   NA 143  12/10/2017 1101   K 4.1 12/10/2017 1101   CL 102 12/10/2017 1101   CO2 27 12/10/2017 1101   GLUCOSE 84 12/10/2017 1101   GLUCOSE 106 (H) 10/21/2015 0433   BUN 17 12/10/2017 1101   CREATININE 0.78 12/10/2017 1101   CREATININE 0.68 02/19/2014 1505   CALCIUM 9.4 12/10/2017 1101   GFRNONAA 88 12/10/2017 1101   GFRAA 101 12/10/2017 1101   Lab Results  Component Value Date   HGBA1C 5.2 12/10/2017   HGBA1C 5.0 10/20/2015   HGBA1C 5.1 06/30/2013   HGBA1C 5.9 (H) 10/10/2012   Lab Results  Component Value Date  INSULIN 8.1 12/10/2017   CBC    Component Value Date/Time   WBC 4.8 12/10/2017 1101   WBC 8.6 07/21/2016 0518   RBC 4.18 12/10/2017 1101   RBC 3.57 (L) 07/21/2016 0518   HGB 13.6 12/10/2017 1101   HCT 40.0 12/10/2017 1101   PLT 176 07/21/2016 0518   MCV 96 12/10/2017 1101   MCH 32.5 12/10/2017 1101   MCH 32.5 07/21/2016 0518   MCHC 34.0 12/10/2017 1101   MCHC 33.6 07/21/2016 0518   RDW 11.9 (L) 12/10/2017 1101   LYMPHSABS 1.5 12/10/2017 1101   MONOABS 0.4 02/19/2014 1505   EOSABS 0.3 12/10/2017 1101   BASOSABS 0.1 12/10/2017 1101   Iron/TIBC/Ferritin/ %Sat    Component Value Date/Time   IRON 108 02/19/2014 1505   TIBC 263 02/19/2014 1505   IRONPCTSAT 41 02/19/2014 1505   Lipid Panel     Component Value Date/Time   CHOL 156 12/10/2017 1101   TRIG 62 12/10/2017 1101   HDL 60 12/10/2017 1101   CHOLHDL 2.5 02/19/2014 1505   VLDL 14 02/19/2014 1505   LDLCALC 84 12/10/2017 1101   Hepatic Function Panel     Component Value Date/Time   PROT 6.8 12/10/2017 1101   ALBUMIN 4.5 12/10/2017 1101   AST 20 12/10/2017 1101   ALT 17 12/10/2017 1101   ALKPHOS 56 12/10/2017 1101   BILITOT 0.4 12/10/2017 1101      Component Value Date/Time   TSH 2.530 12/10/2017 1101   TSH 1.083 10/20/2015 1335   TSH 3.345 06/30/2013 1012   TSH 2.974 10/10/2012 1104    ASSESSMENT AND PLAN: Vitamin D deficiency - Plan: Vitamin D, Ergocalciferol, (DRISDOL) 1.25 MG (50000 UT)  CAPS capsule  At risk for heart disease  Class 1 obesity with serious comorbidity and body mass index (BMI) of 30.0 to 30.9 in adult, unspecified obesity type - Starting bMI greater then 30  PLAN: Vitamin D Deficiency [name] was informed that low vitamin D levels contributes to fatigue and are associated with obesity, breast, and colon cancer. She agrees to continue to take prescription Vit D @50 ,000 IU every week in which a prescription was written today for a 30 day supply and will follow up for routine testing of vitamin D, at least 2-3 times per year. She was informed of the risk of over-replacement of vitamin D and agrees to not increase her dose unless she discusses this with [place] first.  Cardiovascular risk counselling [name] was given extended (15 minutes) coronary artery disease prevention counseling today. She is 52 y.o. female and has risk factors for heart disease including obesity. We discussed intensive lifestyle modifications today with an emphasis on specific weight loss instructions and strategies. Pt was also informed of the importance of increasing exercise and decreasing saturated fats to help prevent heart disease.  Obesity [name] is currently in the action stage of change. As such, her goal is to continue with weight loss efforts She has agreed to follow the Category 2 plan [name] has been instructed to work up to a goal of 150 minutes of combined cardio and strengthening exercise per week for weight loss and overall health benefits. We discussed the following Behavioral Modification Stratagies today: holiday eating strategies  and avoiding temptations   [name] has agreed to follow up with our clinic in 2 weeks. She was informed of the importance of frequent follow up visits to maximize her success with intensive lifestyle modifications for her multiple health conditions.   OBESITY BEHAVIORAL INTERVENTION VISIT  Today's visit was # 3   Starting weight: 197 lbs Starting  date: 12/10/17 Today's weight : Weight: 190 lb (86.2 kg)  Today's date: 01/16/18 Total lbs lost to date: 7    ASK: We discussed the diagnosis of obesity with [name] today and [name] agreed to give [place] permission to discuss obesity behavioral modification therapy today.  ASSESS: [name] has the diagnosis of obesity and her BMI today is 29.8 [name] is in the action stage of change   ADVISE: [name] was educated on the multiple health risks of obesity as well as the benefit of weight loss to improve her health. She was advised of the need for long term treatment and the importance of lifestyle modifications to improve her current health and to decrease her risk of future health problems.  AGREE: Multiple dietary modification options and treatment options were discussed and  [name] agreed to follow the recommendations documented in the above note.  ARRANGE: [name] was educated on the importance of frequent visits to treat obesity as outlined per CMS and USPSTF guidelines and agreed to schedule her next follow up appointment today.  I, April Moore, am acting as Location manager for Dennard Nip, MD.  I have reviewed the above documentation for accuracy and completeness, and I agree with the above. -Dennard Nip, MD

## 2018-01-21 ENCOUNTER — Encounter (INDEPENDENT_AMBULATORY_CARE_PROVIDER_SITE_OTHER): Payer: Self-pay | Admitting: Family Medicine

## 2018-01-29 ENCOUNTER — Encounter (INDEPENDENT_AMBULATORY_CARE_PROVIDER_SITE_OTHER): Payer: Self-pay

## 2018-01-29 ENCOUNTER — Ambulatory Visit (INDEPENDENT_AMBULATORY_CARE_PROVIDER_SITE_OTHER): Payer: BLUE CROSS/BLUE SHIELD | Admitting: Family Medicine

## 2018-01-30 ENCOUNTER — Ambulatory Visit (INDEPENDENT_AMBULATORY_CARE_PROVIDER_SITE_OTHER): Payer: BLUE CROSS/BLUE SHIELD | Admitting: Family Medicine

## 2018-01-31 ENCOUNTER — Ambulatory Visit (INDEPENDENT_AMBULATORY_CARE_PROVIDER_SITE_OTHER): Payer: BLUE CROSS/BLUE SHIELD | Admitting: Family Medicine

## 2018-01-31 ENCOUNTER — Encounter (INDEPENDENT_AMBULATORY_CARE_PROVIDER_SITE_OTHER): Payer: Self-pay | Admitting: Family Medicine

## 2018-01-31 VITALS — BP 135/74 | HR 66 | Ht 67.0 in | Wt 192.0 lb

## 2018-01-31 DIAGNOSIS — E669 Obesity, unspecified: Secondary | ICD-10-CM | POA: Diagnosis not present

## 2018-01-31 DIAGNOSIS — K219 Gastro-esophageal reflux disease without esophagitis: Secondary | ICD-10-CM | POA: Diagnosis not present

## 2018-01-31 DIAGNOSIS — Z6830 Body mass index (BMI) 30.0-30.9, adult: Secondary | ICD-10-CM | POA: Diagnosis not present

## 2018-01-31 NOTE — Progress Notes (Signed)
Office: [phone]  /  Fax: [phone]   HPI:   Chief Complaint: OBESITY [name] is here to discuss her progress with her obesity treatment plan. She is on the Category 2 plan and is following her eating plan approximately 85 % of the time. She states she is exercising 0 minutes 0 times per week. [name] has been sick recently and her sodium has increased so she is retaining fluid. She has family stressors and estrangements. She is caring full time for her grandson.  Her weight is 192 lb (87.1 kg) today and has had a weight gain of 2 pounds over a period of 2 weeks since her last visit. She has lost 5 lbs since starting treatment with [place].  Gastroesophageal Reflux Disease [name] notes symptoms have improved with a change in her diet and weight loss. She only takes a proton pump inhibitor intermittently and rarely.  ASSESSMENT AND PLAN:  Gastroesophageal reflux disease, esophagitis presence not specified  Class 1 obesity with serious comorbidity and body mass index (BMI) of 30.0 to 30.9 in adult, unspecified obesity type  PLAN:  Gastroesophageal Reflux Disease [name] agrees to continue with her diet, exercise, and weight loss. She agrees to follow up in 3 to 4 weeks.  I spent > than 50% of the 15 minute visit on counseling as documented in the note.  Obesity [name] is currently in the action stage of change. As such, her goal is to continue with weight loss efforts. She has agreed to follow the Category 2 plan. [name] has been instructed to work up to a goal of 150 minutes of combined cardio and strengthening exercise per week for weight loss and overall health benefits. We discussed the following Behavioral Modification Strategies today: increasing lean protein intake, decreasing simple carbohydrates, work on meal planning and easy cooking plans, holiday eating strategies, travel eating strategies, and emotional eating strategies.  [name] has agreed to follow up with our clinic in 3 to  4 weeks. She was informed of the importance of frequent follow up visits to maximize her success with intensive lifestyle modifications for her multiple health conditions.  ALLERGIES: No Known Allergies  MEDICATIONS: Current Outpatient Medications on File Prior to Visit  Medication Sig Dispense Refill  . Calcium Citrate-Vitamin D (CALCIUM CITRATE +D PO) Take 1 tablet by mouth 2 (two) times daily.    . Cyanocobalamin (B-12) 500 MCG SUBL Place 500 mcg under the tongue 3 (three) times a week.    . magnesium oxide (MAG-OX) 400 MG tablet Take 400 mg by mouth daily.    . Multiple Vitamins-Minerals (CELEBRATE MULTI-COMPLETE 36) CAPS Take 1 tablet by mouth daily.    . pantoprazole (PROTONIX) 40 MG tablet Take 1 tablet (40 mg total) by mouth daily as needed (heartburn). 50 tablet 2  . Vitamin D, Ergocalciferol, (DRISDOL) 1.25 MG (50000 UT) CAPS capsule Take 1 capsule (50,000 Units total) by mouth every 7 (seven) days. 4 capsule 0   No current facility-administered medications on file prior to visit.     PAST MEDICAL HISTORY: Past Medical History:  Diagnosis Date  . Acid reflux 07/22/2012   much improved with Bariatric diet  . Anemia    Hgb 5 02/18/10, blood transfusion 02/18/10  . Anxiety   . Back pain   . Cervical radiculopathy at C6 01/25/2011  . Cystocele   . Deafness in left ear   . Depression   . Disc disease, degenerative, lumbar or lumbosacral   . Dysfunctional uterine bleeding  resolved after  Hysterectomy(hx. fibroids)  . Dyspnea   . Fatty liver   . Gallbladder problem   . History of reactive hypoglycemia 2017  . Hypertension   . IBS (irritable bowel syndrome)   . Joint pain   . Lateral epicondylitis  of elbow 02/01/2011  . Leg edema   . Morbid obesity ([place])   . OAB (overactive bladder)   . Obesity, Class III, BMI 40-49.9 (morbid obesity) ([place])   . OSA on CPAP    cpap used nightly, settings 13  . PCOS (polycystic ovarian syndrome)   . PONV (postoperative nausea and  vomiting)    no PONV with last procedure when using "patch behind my ear", does endorse extreme pain following hysterectomy , states" i think they managed by pain poorly bc they were worried about my sleep apnea"  . Prediabetes   . Rectocele    stress incontinence  . Syncope 10/2015    PAST SURGICAL HISTORY: Past Surgical History:  Procedure Laterality Date  . ABDOMINAL HYSTERECTOMY    . APPLICATION OF WOUND VAC N/A 07/20/2016   Procedure: APPLICATION OF WOUND VAC;  Surgeon: Excell Seltzer, MD;  Location: [facility];  Service: General;  Laterality: N/A;  . CHOLECYSTECTOMY  1993   laparoscopic  . COLONOSCOPY     2017   . GASTRIC ROUX-EN-Y N/A 12/23/2012   Procedure: LAPAROSCOPIC ROUX-EN-Y GASTRIC BYPASS WITH UPPER ENDOSCOPY;  Surgeon: Madilyn Hook, DO;  Location: [facility];  Service: General;  Laterality: N/A;  . INSERTION OF MESH N/A 07/20/2016   Procedure: INSERTION OF MESH;  Surgeon: Excell Seltzer, MD;  Location: [facility];  Service: General;  Laterality: N/A;  . PANNICULECTOMY N/A 07/20/2016   Procedure: PANNICULECTOMY;  Surgeon: Excell Seltzer, MD;  Location: [facility];  Service: General;  Laterality: N/A;  . UPPER GI ENDOSCOPY N/A 12/23/2012   Procedure: UPPER GI ENDOSCOPY;  Surgeon: Madilyn Hook, DO;  Location: [facility];  Service: General;  Laterality: N/A;  . [place]  . VENTRAL HERNIA REPAIR N/A 07/20/2016   Procedure: REPAIR OF VENTAL HERNIA WITH MESH;  Surgeon: Excell Seltzer, MD;  Location: [facility];  Service: General;  Laterality: N/A;    SOCIAL HISTORY: Social History   Tobacco Use  . Smoking status: Former Smoker    Types: Cigarettes    Last attempt to quit: 02/14/1995    Years since quitting: 22.9  . Smokeless tobacco: Never Used  Substance Use Topics  . Alcohol use: No  . Drug use: No    FAMILY HISTORY: Family History  Problem Relation Age of Onset  . Hyperlipidemia Mother   . Hypertension Mother   . Depression Mother   . Obesity Mother   .  Hyperlipidemia Father   . Hypertension Father   . Heart attack Father 83       5-bypass surgery  . Heart disease Father   . Obesity Father   . Thyroid disease Father   . Cancer Paternal Uncle        colon  . Colon cancer Maternal Uncle   . Diabetes Maternal Aunt     ROS: Review of Systems  Constitutional: Negative for weight loss.    PHYSICAL EXAM: Blood pressure 135/74, pulse 66, height 5\' 7"  (1.702 m), weight 192 lb (87.1 kg), last menstrual period 03/21/2010, SpO2 100 %. Body mass index is 30.07 kg/m. Physical Exam Vitals signs reviewed.  Constitutional:      Appearance: Normal appearance. She is obese.  Cardiovascular:  Rate and Rhythm: Normal rate.  Pulmonary:     Effort: Pulmonary effort is normal.  Musculoskeletal: Normal range of motion.  Skin:    General: Skin is warm and dry.  Neurological:     Mental Status: She is alert and oriented to person, place, and time.  Psychiatric:        Mood and Affect: Mood normal.        Behavior: Behavior normal.     RECENT LABS AND TESTS: BMET    Component Value Date/Time   NA 143 12/10/2017 1101   K 4.1 12/10/2017 1101   CL 102 12/10/2017 1101   CO2 27 12/10/2017 1101   GLUCOSE 84 12/10/2017 1101   GLUCOSE 106 (H) 10/21/2015 0433   BUN 17 12/10/2017 1101   CREATININE 0.78 12/10/2017 1101   CREATININE 0.68 02/19/2014 1505   CALCIUM 9.4 12/10/2017 1101   GFRNONAA 88 12/10/2017 1101   GFRAA 101 12/10/2017 1101   Lab Results  Component Value Date   HGBA1C 5.2 12/10/2017   HGBA1C 5.0 10/20/2015   HGBA1C 5.1 06/30/2013   HGBA1C 5.9 (H) 10/10/2012   Lab Results  Component Value Date   INSULIN 8.1 12/10/2017   CBC    Component Value Date/Time   WBC 4.8 12/10/2017 1101   WBC 8.6 07/21/2016 0518   RBC 4.18 12/10/2017 1101   RBC 3.57 (L) 07/21/2016 0518   HGB 13.6 12/10/2017 1101   HCT 40.0 12/10/2017 1101   PLT 176 07/21/2016 0518   MCV 96 12/10/2017 1101   MCH 32.5 12/10/2017 1101   MCH 32.5  07/21/2016 0518   MCHC 34.0 12/10/2017 1101   MCHC 33.6 07/21/2016 0518   RDW 11.9 (L) 12/10/2017 1101   LYMPHSABS 1.5 12/10/2017 1101   MONOABS 0.4 02/19/2014 1505   EOSABS 0.3 12/10/2017 1101   BASOSABS 0.1 12/10/2017 1101   Iron/TIBC/Ferritin/ %Sat    Component Value Date/Time   IRON 108 02/19/2014 1505   TIBC 263 02/19/2014 1505   IRONPCTSAT 41 02/19/2014 1505   Lipid Panel     Component Value Date/Time   CHOL 156 12/10/2017 1101   TRIG 62 12/10/2017 1101   HDL 60 12/10/2017 1101   CHOLHDL 2.5 02/19/2014 1505   VLDL 14 02/19/2014 1505   LDLCALC 84 12/10/2017 1101   Hepatic Function Panel     Component Value Date/Time   PROT 6.8 12/10/2017 1101   ALBUMIN 4.5 12/10/2017 1101   AST 20 12/10/2017 1101   ALT 17 12/10/2017 1101   ALKPHOS 56 12/10/2017 1101   BILITOT 0.4 12/10/2017 1101      Component Value Date/Time   TSH 2.530 12/10/2017 1101   TSH 1.083 10/20/2015 1335   TSH 3.345 06/30/2013 1012   TSH 2.974 10/10/2012 1104   Results for Sonoda, LAKETA SANDOZ (MRN [id number]) as of 01/31/2018 12:50  Ref. Range 12/10/2017 11:01  Vitamin D, 25-Hydroxy Latest Ref Range: 30.0 - 100.0 ng/mL 36.3    OBESITY BEHAVIORAL INTERVENTION VISIT  Today's visit was # 4   Starting weight: 197 lbs Starting date: 12/10/17 Today's weight : Weight: 192 lb (87.1 kg)  Today's date: 01/31/2018 Total lbs lost to date: 5  ASK: We discussed the diagnosis of obesity with [name] today and [name] agreed to give [place] permission to discuss obesity behavioral modification therapy today.  ASSESS: [name] has the diagnosis of obesity and her BMI today is 30.06. [name] is in the action stage of change.   ADVISE: [name] was educated on  the multiple health risks of obesity as well as the benefit of weight loss to improve her health. She was advised of the need for long term treatment and the importance of lifestyle modifications to improve her current health and to decrease her risk of future  health problems.  AGREE: Multiple dietary modification options and treatment options were discussed and [name] agreed to follow the recommendations documented in the above note.  ARRANGE: [name] was educated on the importance of frequent visits to treat obesity as outlined per CMS and USPSTF guidelines and agreed to schedule her next follow up appointment today.  I, Marcille Blanco, am acting as transcriptionist for Starlyn Skeans, MD I have reviewed the above documentation for accuracy and completeness, and I agree with the above. -Dennard Nip, MD

## 2018-02-28 ENCOUNTER — Ambulatory Visit (INDEPENDENT_AMBULATORY_CARE_PROVIDER_SITE_OTHER): Payer: BLUE CROSS/BLUE SHIELD | Admitting: Family Medicine

## 2018-10-29 ENCOUNTER — Other Ambulatory Visit: Payer: Self-pay | Admitting: Family Medicine

## 2018-10-29 DIAGNOSIS — Z1231 Encounter for screening mammogram for malignant neoplasm of breast: Secondary | ICD-10-CM

## 2021-04-22 NOTE — Progress Notes (Signed)
error
